# Patient Record
Sex: Female | Born: 1965 | Race: White | Hispanic: No | Marital: Married | Smoking: Never smoker
Health system: Southern US, Community
[De-identification: ages and names within clinical notes are randomized; demographics above are authoritative.]

## PROBLEM LIST (undated history)

## (undated) DIAGNOSIS — F419 Anxiety disorder, unspecified: Secondary | ICD-10-CM

## (undated) HISTORY — DX: Anxiety disorder, unspecified: F41.9

## (undated) HISTORY — PX: NO PAST SURGERIES: SHX2092

---

## 2009-07-11 ENCOUNTER — Ambulatory Visit: Payer: Self-pay | Admitting: Obstetrics and Gynecology

## 2009-07-17 ENCOUNTER — Ambulatory Visit: Payer: Self-pay | Admitting: Obstetrics and Gynecology

## 2010-01-21 ENCOUNTER — Ambulatory Visit: Payer: Self-pay | Admitting: Surgery

## 2015-08-09 ENCOUNTER — Ambulatory Visit (INDEPENDENT_AMBULATORY_CARE_PROVIDER_SITE_OTHER): Payer: 59 | Admitting: Family Medicine

## 2015-08-09 ENCOUNTER — Encounter: Payer: Self-pay | Admitting: Family Medicine

## 2015-08-09 ENCOUNTER — Encounter (INDEPENDENT_AMBULATORY_CARE_PROVIDER_SITE_OTHER): Payer: Self-pay

## 2015-08-09 VITALS — BP 118/82 | HR 76 | Temp 98.2°F | Ht 67.0 in | Wt 212.2 lb

## 2015-08-09 DIAGNOSIS — E669 Obesity, unspecified: Secondary | ICD-10-CM | POA: Diagnosis not present

## 2015-08-09 DIAGNOSIS — F419 Anxiety disorder, unspecified: Secondary | ICD-10-CM

## 2015-08-09 DIAGNOSIS — Z0000 Encounter for general adult medical examination without abnormal findings: Secondary | ICD-10-CM | POA: Insufficient documentation

## 2015-08-09 DIAGNOSIS — F418 Other specified anxiety disorders: Secondary | ICD-10-CM | POA: Insufficient documentation

## 2015-08-09 LAB — COMPREHENSIVE METABOLIC PANEL
ALBUMIN: 3.7 g/dL (ref 3.5–5.2)
ALK PHOS: 62 U/L (ref 39–117)
ALT: 13 U/L (ref 0–35)
AST: 15 U/L (ref 0–37)
BILIRUBIN TOTAL: 0.4 mg/dL (ref 0.2–1.2)
BUN: 8 mg/dL (ref 6–23)
CO2: 28 mEq/L (ref 19–32)
CREATININE: 0.74 mg/dL (ref 0.40–1.20)
Calcium: 8.9 mg/dL (ref 8.4–10.5)
Chloride: 106 mEq/L (ref 96–112)
GFR: 89.19 mL/min (ref >60.00)
GLUCOSE: 86 mg/dL (ref 70–99)
Potassium: 4.2 mEq/L (ref 3.5–5.1)
SODIUM: 139 meq/L (ref 135–145)
TOTAL PROTEIN: 6.2 g/dL (ref 6.0–8.3)

## 2015-08-09 LAB — LIPID PANEL
CHOLESTEROL: 165 mg/dL (ref 0–200)
HDL: 43.2 mg/dL (ref >39.00)
LDL Cholesterol: 103 mg/dL — ABNORMAL HIGH (ref 0–99)
NONHDL: 121.69
Total CHOL/HDL Ratio: 4
Triglycerides: 92 mg/dL (ref 0.0–149.0)
VLDL: 18.4 mg/dL (ref 0.0–40.0)

## 2015-08-09 LAB — TSH: TSH: 1.43 u[IU]/mL (ref 0.35–4.50)

## 2015-08-09 MED ORDER — HYDROXYZINE HCL 50 MG PO TABS: 25.0000 mg | ORAL_TABLET | Freq: Three times a day (TID) | ORAL | Status: DC | PRN

## 2015-08-09 NOTE — Patient Instructions (Addendum)
Good to meet you today. labwork today.  Return as needed or in 1 year for physical.  Health Maintenance, Female Adopting a healthy lifestyle and getting preventive care can go a long way to promote health and wellness. Talk with your health care provider about what schedule of regular examinations is right for you. This is a good chance for you to check in with your provider about disease prevention and staying healthy. In between checkups, there are plenty of things you can do on your own. Experts have done a lot of research about which lifestyle changes and preventive measures are most likely to keep you healthy. Ask your health care provider for more information. WEIGHT AND DIET  Eat a healthy diet  Be sure to include plenty of vegetables, fruits, low-fat dairy products, and lean protein.  Do not eat a lot of foods high in solid fats, added sugars, or salt.  Get regular exercise. This is one of the most important things you can do for your health.  Most adults should exercise for at least 150 minutes each week. The exercise should increase your heart rate and make you sweat (moderate-intensity exercise).  Most adults should also do strengthening exercises at least twice a week. This is in addition to the moderate-intensity exercise.  Maintain a healthy weight  Body mass index (BMI) is a measurement that can be used to identify possible weight problems. It estimates body fat based on height and weight. Your health care provider can help determine your BMI and help you achieve or maintain a healthy weight.  For females 49 years of age and older:   A BMI below 18.5 is considered underweight.  A BMI of 18.5 to 24.9 is normal.  A BMI of 25 to 29.9 is considered overweight.  A BMI of 30 and above is considered obese.  Watch levels of cholesterol and blood lipids  You should start having your blood tested for lipids and cholesterol at 47 years of age, then have this test every 5  years.  You may need to have your cholesterol levels checked more often if:  Your lipid or cholesterol levels are high.  You are older than 47 years of age.  You are at high risk for heart disease.  CANCER SCREENING   Lung Cancer  Lung cancer screening is recommended for adults 97-55 years old who are at high risk for lung cancer because of a history of smoking.  A yearly low-dose CT scan of the lungs is recommended for people who:  Currently smoke.  Have quit within the past 15 years.  Have at least a 30-pack-year history of smoking. A pack year is smoking an average of one pack of cigarettes a day for 1 year.  Yearly screening should continue until it has been 15 years since you quit.  Yearly screening should stop if you develop a health problem that would prevent you from having lung cancer treatment.  Breast Cancer  Practice breast self-awareness. This means understanding how your breasts normally appear and feel.  It also means doing regular breast self-exams. Let your health care provider know about any changes, no matter how small.  If you are in your 20s or 30s, you should have a clinical breast exam (CBE) by a health care provider every 1-3 years as part of a regular health exam.  If you are 45 or older, have a CBE every year. Also consider having a breast X-ray (mammogram) every year.  If you have  a family history of breast cancer, talk to your health care provider about genetic screening.  If you are at high risk for breast cancer, talk to your health care provider about having an MRI and a mammogram every year.  Breast cancer gene (BRCA) assessment is recommended for women who have family members with BRCA-related cancers. BRCA-related cancers include:  Breast.  Ovarian.  Tubal.  Peritoneal cancers.  Results of the assessment will determine the need for genetic counseling and BRCA1 and BRCA2 testing. Cervical Cancer Your health care provider may  recommend that you be screened regularly for cancer of the pelvic organs (ovaries, uterus, and vagina). This screening involves a pelvic examination, including checking for microscopic changes to the surface of your cervix (Pap test). You may be encouraged to have this screening done every 3 years, beginning at age 21.  For women ages 30-65, health care providers may recommend pelvic exams and Pap testing every 3 years, or they may recommend the Pap and pelvic exam, combined with testing for human papilloma virus (HPV), every 5 years. Some types of HPV increase your risk of cervical cancer. Testing for HPV may also be done on women of any age with unclear Pap test results.  Other health care providers may not recommend any screening for nonpregnant women who are considered low risk for pelvic cancer and who do not have symptoms. Ask your health care provider if a screening pelvic exam is right for you.  If you have had past treatment for cervical cancer or a condition that could lead to cancer, you need Pap tests and screening for cancer for at least 20 years after your treatment. If Pap tests have been discontinued, your risk factors (such as having a new sexual partner) need to be reassessed to determine if screening should resume. Some women have medical problems that increase the chance of getting cervical cancer. In these cases, your health care provider may recommend more frequent screening and Pap tests. Colorectal Cancer  This type of cancer can be detected and often prevented.  Routine colorectal cancer screening usually begins at 47 years of age and continues through 47 years of age.  Your health care provider may recommend screening at an earlier age if you have risk factors for colon cancer.  Your health care provider may also recommend using home test kits to check for hidden blood in the stool.  A small camera at the end of a tube can be used to examine your colon directly  (sigmoidoscopy or colonoscopy). This is done to check for the earliest forms of colorectal cancer.  Routine screening usually begins at age 50.  Direct examination of the colon should be repeated every 5-10 years through 47 years of age. However, you may need to be screened more often if early forms of precancerous polyps or small growths are found. Skin Cancer  Check your skin from head to toe regularly.  Tell your health care provider about any new moles or changes in moles, especially if there is a change in a mole's shape or color.  Also tell your health care provider if you have a mole that is larger than the size of a pencil eraser.  Always use sunscreen. Apply sunscreen liberally and repeatedly throughout the day.  Protect yourself by wearing long sleeves, pants, a wide-brimmed hat, and sunglasses whenever you are outside. HEART DISEASE, DIABETES, AND HIGH BLOOD PRESSURE   High blood pressure causes heart disease and increases the risk of stroke. High   blood pressure is more likely to develop in:  People who have blood pressure in the high end of the normal range (130-139/85-89 mm Hg).  People who are overweight or obese.  People who are African American.  If you are 18-39 years of age, have your blood pressure checked every 3-5 years. If you are 40 years of age or older, have your blood pressure checked every year. You should have your blood pressure measured twice--once when you are at a hospital or clinic, and once when you are not at a hospital or clinic. Record the average of the two measurements. To check your blood pressure when you are not at a hospital or clinic, you can use:  An automated blood pressure machine at a pharmacy.  A home blood pressure monitor.  If you are between 55 years and 79 years old, ask your health care provider if you should take aspirin to prevent strokes.  Have regular diabetes screenings. This involves taking a blood sample to check your  fasting blood sugar level.  If you are at a normal weight and have a low risk for diabetes, have this test once every three years after 47 years of age.  If you are overweight and have a high risk for diabetes, consider being tested at a younger age or more often. PREVENTING INFECTION  Hepatitis B  If you have a higher risk for hepatitis B, you should be screened for this virus. You are considered at high risk for hepatitis B if:  You were born in a country where hepatitis B is common. Ask your health care provider which countries are considered high risk.  Your parents were born in a high-risk country, and you have not been immunized against hepatitis B (hepatitis B vaccine).  You have HIV or AIDS.  You use needles to inject street drugs.  You live with someone who has hepatitis B.  You have had sex with someone who has hepatitis B.  You get hemodialysis treatment.  You take certain medicines for conditions, including cancer, organ transplantation, and autoimmune conditions. Hepatitis C  Blood testing is recommended for:  Everyone born from 1945 through 1965.  Anyone with known risk factors for hepatitis C. Sexually transmitted infections (STIs)  You should be screened for sexually transmitted infections (STIs) including gonorrhea and chlamydia if:  You are sexually active and are younger than 47 years of age.  You are older than 47 years of age and your health care provider tells you that you are at risk for this type of infection.  Your sexual activity has changed since you were last screened and you are at an increased risk for chlamydia or gonorrhea. Ask your health care provider if you are at risk.  If you do not have HIV, but are at risk, it may be recommended that you take a prescription medicine daily to prevent HIV infection. This is called pre-exposure prophylaxis (PrEP). You are considered at risk if:  You are sexually active and do not regularly use condoms or  know the HIV status of your partner(s).  You take drugs by injection.  You are sexually active with a partner who has HIV. Talk with your health care provider about whether you are at high risk of being infected with HIV. If you choose to begin PrEP, you should first be tested for HIV. You should then be tested every 3 months for as long as you are taking PrEP.  PREGNANCY   If you are   If you are premenopausal and you may become pregnant, ask your health care provider about preconception counseling.  If you may become pregnant, take 400 to 800 micrograms (mcg) of folic acid every day.  If you want to prevent pregnancy, talk to your health care provider about birth control (contraception). OSTEOPOROSIS AND MENOPAUSE   Osteoporosis is a disease in which the bones lose minerals and strength with aging. This can result in serious bone fractures. Your risk for osteoporosis can be identified using a bone density scan.  If you are 65 years of age or older, or if you are at risk for osteoporosis and fractures, ask your health care provider if you should be screened.  Ask your health care provider whether you should take a calcium or vitamin D supplement to lower your risk for osteoporosis.  Menopause may have certain physical symptoms and risks.  Hormone replacement therapy may reduce some of these symptoms and risks. Talk to your health care provider about whether hormone replacement therapy is right for you.  HOME CARE INSTRUCTIONS   Schedule regular health, dental, and eye exams.  Stay current with your immunizations.   Do not use any tobacco products including cigarettes, chewing tobacco, or electronic cigarettes.  If you are pregnant, do not drink alcohol.  If you are breastfeeding, limit how much and how often you drink alcohol.  Limit alcohol intake to no more than 1 drink per day for nonpregnant women. One drink equals 12 ounces of beer, 5 ounces of wine, or 1 ounces of hard  liquor.  Do not use street drugs.  Do not share needles.  Ask your health care provider for help if you need support or information about quitting drugs.  Tell your health care provider if you often feel depressed.  Tell your health care provider if you have ever been abused or do not feel safe at home.   This information is not intended to replace advice given to you by your health care provider. Make sure you discuss any questions you have with your health care provider.   Document Released: 02/23/2011 Document Revised: 08/31/2014 Document Reviewed: 07/12/2013 Elsevier Interactive Patient Education 2016 [employer] Inc.  

## 2015-08-09 NOTE — Assessment & Plan Note (Signed)
Discussed healthy diet and lifestyle changes to affect sustainable weight loss  

## 2015-08-09 NOTE — Progress Notes (Signed)
BP 118/82 mmHg  Pulse 76  Temp(Src) 98.2 F (36.8 C) (Oral)  Ht 5\' 7"  (1.702 m)  Wt 212 lb 4 oz (96.276 kg)  BMI 33.24 kg/m2  LMP 07/26/2015   CC: new pt to establish  Subjective:    [name], female    DOB: 1966/03/24, 49 y.o.   MRN: [id number]  HPI: Jasmine Willis is a 49 y.o. female presenting on 08/09/2015 for Establish Care   Mild anxiety - requests refill of hydroxyzine 50mg  PRN. Some URI sxs - Cough, congestion, ST, drainage, rhinorrhea. No fever. Slowly improving.   R knee intermittent pain. Treated with meloxicam 15mg  prn.   Preventative: [facility]. Normal pap smears.  Mammogram normal a few years ago  Flu shot yearly Tetanus - ? Seat belt use discussed Sunscreen use discussed. No changing moles on skin.  Lives with husband and 2 children, 1 dog Edu: some college Occupation: works at [employer] Activity: walking 2 miles 3x/wk, lots of walking at work Diet: good water, fruits/vegetables daily  Relevant past medical, surgical, family and social history reviewed and updated as indicated. Interim medical history since our last visit reviewed. Allergies and medications reviewed and updated. No current outpatient prescriptions on file prior to visit.   No current facility-administered medications on file prior to visit.    Review of Systems  Constitutional: Negative for fever, chills, activity change, appetite change, fatigue and unexpected weight change.  HENT: Positive for congestion. Negative for hearing loss.   Eyes: Negative for visual disturbance.  Respiratory: Positive for choking. Negative for cough, chest tightness, shortness of breath and wheezing.   Cardiovascular: Negative for chest pain, palpitations and leg swelling.  Gastrointestinal: Negative for nausea, vomiting, abdominal pain, diarrhea, constipation, blood in stool and abdominal distention.  Genitourinary: Negative for hematuria and difficulty  urinating.  Musculoskeletal: Negative for myalgias, arthralgias and neck pain.  Skin: Negative for rash.  Neurological: Negative for dizziness, seizures, syncope and headaches.  Hematological: Negative for adenopathy. Does not bruise/bleed easily.  Psychiatric/Behavioral: Negative for dysphoric mood. The patient is nervous/anxious.    Per HPI unless specifically indicated in ROS section     Objective:    BP 118/82 mmHg  Pulse 76  Temp(Src) 98.2 F (36.8 C) (Oral)  Ht 5\' 7"  (1.702 m)  Wt 212 lb 4 oz (96.276 kg)  BMI 33.24 kg/m2  LMP 07/26/2015  Wt Readings from Last 3 Encounters:  08/09/15 212 lb 4 oz (96.276 kg)    Physical Exam  Constitutional: She is oriented to person, place, and time. She appears well-developed and well-nourished. No distress.  HENT:  Head: Normocephalic and atraumatic.  Right Ear: Hearing, tympanic membrane, external ear and ear canal normal.  Left Ear: Hearing, tympanic membrane, external ear and ear canal normal.  Nose: Nose normal.  Mouth/Throat: Uvula is midline, oropharynx is clear and moist and mucous membranes are normal. No oropharyngeal exudate, posterior oropharyngeal edema or posterior oropharyngeal erythema.  Eyes: Conjunctivae and EOM are normal. Pupils are equal, round, and reactive to light. No scleral icterus.  Neck: Normal range of motion. Neck supple. No thyromegaly present.  Cardiovascular: Normal rate, regular rhythm, normal heart sounds and intact distal pulses.   No murmur heard. Pulses:      Radial pulses are 2+ on the right side, and 2+ on the left side.  Pulmonary/Chest: Effort normal and breath sounds normal. No respiratory distress. She has no wheezes. She has no rales.  Abdominal:  Soft. Bowel sounds are normal. She exhibits no distension and no mass. There is no tenderness. There is no rebound and no guarding.  Musculoskeletal: Normal range of motion. She exhibits no edema.  Lymphadenopathy:    She has no cervical adenopathy.    Neurological: She is alert and oriented to person, place, and time.  CN grossly intact, station and gait intact  Skin: Skin is warm and dry. No rash noted.  Psychiatric: She has a normal mood and affect. Her behavior is normal. Judgment and thought content normal.  Nursing note and vitals reviewed.  No results found for this or any previous visit.    Assessment & Plan:   Problem List Items Addressed This Visit    Obesity, Class I, BMI 30-34.9    Discussed healthy diet and lifestyle changes to affect sustainable weight loss.      Relevant Orders   Comprehensive metabolic panel   Lipid panel   TSH   Health maintenance examination - Primary    Preventative protocols reviewed and updated unless pt declined. Discussed healthy diet and lifestyle.       Anxiety    Mild, treated with prn  hydroxyzine tablet.      Relevant Medications   hydrOXYzine (ATARAX/VISTARIL) 50 MG tablet       Follow up plan: Return in about 1 year (around 08/08/2016), or as needed, for annual exam, prior fasting for blood work.

## 2015-08-09 NOTE — Progress Notes (Signed)
Pre visit review using our clinic review tool, if applicable. No additional management support is needed unless otherwise documented below in the visit note. 

## 2015-08-09 NOTE — Assessment & Plan Note (Signed)
Preventative protocols reviewed and updated unless pt declined. Discussed healthy diet and lifestyle.  

## 2015-08-09 NOTE — Assessment & Plan Note (Signed)
Mild, treated with prn 50mg  hydroxyzine tablet.

## 2015-08-12 ENCOUNTER — Encounter: Payer: Self-pay | Admitting: *Deleted

## 2016-06-05 ENCOUNTER — Other Ambulatory Visit: Payer: Self-pay | Admitting: Physician Assistant

## 2016-06-05 ENCOUNTER — Ambulatory Visit (INDEPENDENT_AMBULATORY_CARE_PROVIDER_SITE_OTHER)
Admission: RE | Admit: 2016-06-05 | Discharge: 2016-06-05 | Disposition: A | Discharge status: Home / Self Care | Payer: 59 | Source: Ambulatory Visit | Attending: Family Medicine | Admitting: Family Medicine

## 2016-06-05 ENCOUNTER — Encounter: Payer: Self-pay | Admitting: Family Medicine

## 2016-06-05 ENCOUNTER — Encounter (INDEPENDENT_AMBULATORY_CARE_PROVIDER_SITE_OTHER): Payer: Self-pay

## 2016-06-05 ENCOUNTER — Other Ambulatory Visit: Payer: Self-pay | Admitting: Family Medicine

## 2016-06-05 ENCOUNTER — Ambulatory Visit (INDEPENDENT_AMBULATORY_CARE_PROVIDER_SITE_OTHER): Payer: 59 | Admitting: Family Medicine

## 2016-06-05 ENCOUNTER — Ambulatory Visit: Payer: 59 | Admitting: Family Medicine

## 2016-06-05 VITALS — BP 120/80 | HR 66 | Temp 97.9°F | Wt 222.5 lb

## 2016-06-05 DIAGNOSIS — M79671 Pain in right foot: Secondary | ICD-10-CM | POA: Diagnosis not present

## 2016-06-05 DIAGNOSIS — M722 Plantar fascial fibromatosis: Secondary | ICD-10-CM

## 2016-06-05 DIAGNOSIS — M25561 Pain in right knee: Principal | ICD-10-CM

## 2016-06-05 DIAGNOSIS — Z6834 Body mass index (BMI) 34.0-34.9, adult: Secondary | ICD-10-CM

## 2016-06-05 DIAGNOSIS — G8929 Other chronic pain: Secondary | ICD-10-CM

## 2016-06-05 NOTE — Telephone Encounter (Signed)
Patient contacted and informed to follow up before she runs out no refills until then

## 2016-06-05 NOTE — Progress Notes (Signed)
   Subjective:    [name], female    DOB: 07/02/1966, 50 y.o.   MRN: [id number]  HPI This is a 50 yo female who presents today with right knee pain x 3-4 months. She does not recall any injury. Has had intermittent knee pain in the past and was told it was likely arthritis. Has morning stiffness and can not always extend leg fully. Has taken meloxicam and ibuprofen. More relief with ibuprofen. Knee will occasionally give out. Has tried a slip on knee brace, but felt it was too constrictive and limited her movement. Wore corrective shoes as a child. Also has pain on bottom of right foot. Has gotten new shoes which helps. Has had to decrease her walking due to foot pain.   Past Medical History:  Diagnosis Date  . Anxiety    Past Surgical History:  Procedure Laterality Date  . NO PAST SURGERIES     Family History  Problem Relation Age of Onset  . Diabetes Maternal Grandfather   . Cancer Paternal Grandmother     liver?  . CAD Neg Hx   . Stroke Neg Hx    Social History  Substance Use Topics  . Smoking status: Never Smoker  . Smokeless tobacco: Never Used  . Alcohol use 0.0 oz/week      Review of Systems Per HPI    Objective:   Physical Exam  Constitutional: She is oriented to person, place, and time. She appears well-developed and well-nourished. No distress.  HENT:  Head: Normocephalic and atraumatic.  Cardiovascular: Normal rate.   Pulmonary/Chest: Effort normal.  Musculoskeletal:       Right knee: She exhibits swelling (slight medial patellar.). She exhibits normal range of motion, no effusion, no ecchymosis, no deformity, no erythema, normal alignment, no LCL laxity, normal patellar mobility and no bony tenderness.  Feet with high arches.  Neurological: She is alert and oriented to person, place, and time.  Skin: Skin is warm and dry. She is not diaphoretic.  Psychiatric: She has a normal mood and affect. Her behavior is normal. Judgment and thought  content normal.  Vitals reviewed.     BP 120/80   Pulse 66   Temp 97.9 F (36.6 C) (Oral)   Wt 222 lb 8 oz (100.9 kg)   SpO2 99%   BMI 34.85 kg/m  Wt Readings from Last 3 Encounters:  06/05/16 222 lb 8 oz (100.9 kg)  08/09/15 212 lb 4 oz (96.3 kg)       Assessment & Plan:  1. Chronic pain of right knee - discussed otc analgesics and heat/ice - will determine follow up based on xray - DG Knee Complete 4 Views Right; Future  2. Right foot pain - likely plantar fasciitis, see #3  3. Plantar fasciitis of right foot - has gotten some relief with shoe change and otc inserts, she is wearing a very flat shoe today - encouraged supportive shoes all the time while awake, continue stretching exercises - otc analgesics  4. BMI 34.0-34.9,adult - encouraged as much activity as she can tolerate - decrease portions when not able to exercise as much - discussed effect of weight on joints   Olean Reeeborah Margarine Grosshans, FNP-BC  [facility], [facility]  06/05/2016 9:59 AM

## 2016-06-05 NOTE — Patient Instructions (Signed)
Continue to do daily stretching exercises for foot and knee I will call you about your xray and we will determine follow up Can take ibuprofen 200 mg tablets- 2-3 every 8 to 12 hours as needed, can alternate with Tylenol    Knee Pain Knee pain is a very common symptom and can have many causes. Knee pain often goes away when you follow your health care provider's instructions for relieving pain and discomfort at home. However, knee pain can develop into a condition that needs treatment. Some conditions may include:  Arthritis caused by wear and tear (osteoarthritis).  Arthritis caused by swelling and irritation (rheumatoid arthritis or gout).  A cyst or growth in your knee.  An infection in your knee joint.  An injury that will not heal.  Damage, swelling, or irritation of the tissues that support your knee (torn ligaments or tendinitis). If your knee pain continues, additional tests may be ordered to diagnose your condition. Tests may include X-rays or other imaging studies of your knee. You may also need to have fluid removed from your knee. Treatment for ongoing knee pain depends on the cause, but treatment may include:  Medicines to relieve pain or swelling.  Steroid injections in your knee.  Physical therapy.  Surgery. HOME CARE INSTRUCTIONS  Take medicines only as directed by your health care provider.  Rest your knee and keep it raised (elevated) while you are resting.  Do not do things that cause or worsen pain.  Avoid high-impact activities or exercises, such as running, jumping rope, or doing jumping jacks.  Apply ice to the knee area:  Put ice in a plastic bag.  Place a towel between your skin and the bag.  Leave the ice on for 20 minutes, 2-3 times a day.  Ask your health care provider if you should wear an elastic knee support.  Keep a pillow under your knee when you sleep.  Lose weight if you are overweight. Extra weight can put pressure on your  knee.  Do not use any tobacco products, including cigarettes, chewing tobacco, or electronic cigarettes. If you need help quitting, ask your health care provider. Smoking may slow the healing of any bone and joint problems that you may have. SEEK MEDICAL CARE IF:  Your knee pain continues, changes, or gets worse.  You have a fever along with knee pain.  Your knee buckles or locks up.  Your knee becomes more swollen. SEEK IMMEDIATE MEDICAL CARE IF:   Your knee joint feels hot to the touch.  You have chest pain or trouble breathing.   This information is not intended to replace advice given to you by your health care provider. Make sure you discuss any questions you have with your health care provider.   Document Released: 06/07/2007 Document Revised: 08/31/2014 Document Reviewed: 03/26/2014 Elsevier Interactive Patient Education 2016 Else[employer] Inc[employer]    Plantar Fasciitis Plantar fasciitis is a painful foot condition that affects the heel. It occurs when the band of tissue that connects the toes to the heel bone (plantar fascia) becomes irritated. This can happen after exercising too much or doing other repetitive activities (overuse injury). The pain from plantar fasciitis can range from mild irritation to severe pain that makes it difficult for you to walk or move. The pain is usually worse in the morning or after you have been sitting or lying down for a while. CAUSES This condition may be caused by:  Standing for long periods of time.  Wearing shoes  that do not fit.  Doing high-impact activities, including running, aerobics, and ballet.  Being overweight.  Having an abnormal way of walking (gait).  Having tight calf muscles.  Having high arches in your feet.  Starting a new athletic activity. SYMPTOMS The main symptom of this condition is heel pain. Other symptoms include:  Pain that gets worse after activity or exercise.  Pain that is worse in the morning or after  resting.  Pain that goes away after you walk for a few minutes. DIAGNOSIS This condition may be diagnosed based on your signs and symptoms. Your health care provider will also do a physical exam to check for:  A tender area on the bottom of your foot.  A high arch in your foot.  Pain when you move your foot.  Difficulty moving your foot. You may also need to have imaging studies to confirm the diagnosis. These can include:  X-rays.  Ultrasound.  MRI. TREATMENT  Treatment for plantar fasciitis depends on the severity of the condition. Your treatment may include:  Rest, ice, and over-the-counter pain medicines to manage your pain.  Exercises to stretch your calves and your plantar fascia.  A splint that holds your foot in a stretched, upward position while you sleep (night splint).  Physical therapy to relieve symptoms and prevent problems in the future.  Cortisone injections to relieve severe pain.  Extracorporeal shock wave therapy (ESWT) to stimulate damaged plantar fascia with electrical impulses. It is often used as a last resort before surgery.  Surgery, if other treatments have not worked after 12 months. HOME CARE INSTRUCTIONS  Take medicines only as directed by your health care provider.  Avoid activities that cause pain.  Roll the bottom of your foot over a bag of ice or a bottle of cold water. Do this for 20 minutes, 3-4 times a day.  Perform simple stretches as directed by your health care provider.  Try wearing athletic shoes with air-sole or gel-sole cushions or soft shoe inserts.  Wear a night splint while sleeping, if directed by your health care provider.  Keep all follow-up appointments with your health care provider. PREVENTION   Do not perform exercises or activities that cause heel pain.  Consider finding low-impact activities if you continue to have problems.  Lose weight if you need to. The best way to prevent plantar fasciitis is to  avoid the activities that aggravate your plantar fascia. SEEK MEDICAL CARE IF:  Your symptoms do not go away after treatment with home care measures.  Your pain gets worse.  Your pain affects your ability to move or do your daily activities.   This information is not intended to replace advice given to you by your health care provider. Make sure you discuss any questions you have with your health care provider.   Document Released: 05/05/2001 Document Revised: 05/01/2015 Document Reviewed: 06/20/2014 Elsevier Interactive Patient Education [employer]

## 2016-06-05 NOTE — Telephone Encounter (Signed)
Med refill approved x 1; pt will need to be seen for additional refills

## 2016-06-07 ENCOUNTER — Other Ambulatory Visit: Payer: Self-pay | Admitting: Family Medicine

## 2016-06-07 DIAGNOSIS — M1711 Unilateral primary osteoarthritis, right knee: Secondary | ICD-10-CM

## 2016-06-07 MED ORDER — MELOXICAM 15 MG PO TABS
15.0000 mg | ORAL_TABLET | Freq: Every day | ORAL | 0 refills | Status: DC | PRN
Start: 2016-06-07 — End: 2018-06-22

## 2016-07-31 ENCOUNTER — Ambulatory Visit (INDEPENDENT_AMBULATORY_CARE_PROVIDER_SITE_OTHER): Payer: 59 | Admitting: Primary Care

## 2016-07-31 ENCOUNTER — Encounter: Payer: Self-pay | Admitting: Primary Care

## 2016-07-31 VITALS — BP 124/84 | HR 81 | Temp 98.1°F | Ht 67.0 in | Wt 224.8 lb

## 2016-07-31 DIAGNOSIS — J029 Acute pharyngitis, unspecified: Secondary | ICD-10-CM | POA: Diagnosis not present

## 2016-07-31 DIAGNOSIS — J028 Acute pharyngitis due to other specified organisms: Principal | ICD-10-CM

## 2016-07-31 DIAGNOSIS — B9789 Other viral agents as the cause of diseases classified elsewhere: Secondary | ICD-10-CM

## 2016-07-31 LAB — POCT RAPID STREP A (OFFICE): RAPID STREP A SCREEN: NEGATIVE

## 2016-07-31 MED ORDER — METHYLPREDNISOLONE ACETATE 80 MG/ML IJ SUSP
80.0000 mg | Freq: Once | INTRAMUSCULAR | Status: AC
  Administered 2016-07-31: 80 mg via INTRAMUSCULAR

## 2016-07-31 MED ORDER — LIDOCAINE VISCOUS 2 % MT SOLN: OROMUCOSAL | 0 refills | Status: DC

## 2016-07-31 NOTE — Progress Notes (Signed)
Pre visit review using our clinic review tool, if applicable. No additional management support is needed unless otherwise documented below in the visit note. 

## 2016-07-31 NOTE — Addendum Note (Signed)
Addended by: Tawnya CrookSAMBATH, Kelcy Baeten on: 07/31/2016 12:33 PM   Modules accepted: Orders

## 2016-07-31 NOTE — Patient Instructions (Signed)
We will notify you once we receive your throat culture result.  You may gargle and swallow 10 ml of the viscous lidocaine four times daily as needed for sore throat.  Continue ibuprofen as needed for pain and inflammation. Try warm salt gargles, throat lozenges, chloraseptic spray.  Cough/Congestion: Try taking Mucinex DM or Delsym DM. This will help loosen up the mucous in your chest. Ensure you take this medication with a full glass of water.  Nasal Congestion/Ear Pressure: Try using Flonase (fluticasone) nasal spray. Instill 1 spray in each nostril twice daily.   Ensure you are staying hydrated with water and rest.  Please notify me if you develop persistent fevers of 101, start coughing up green mucous, notice increased fatigue or weakness, and/or feel worse after 1 week of onset of symptoms.   It was a pleasure to see you today!

## 2016-07-31 NOTE — Progress Notes (Signed)
Subjective:    [name], female    DOB: [birth year], 50 y.o.   MRN: [id number]  HPI  Ms. Jasmine Willis is a 50 year old female who presents today with a chief complaint of sore throat. She also reports fatigue, left ear pain, cough, swelling to her throat, pain with swallowing. Her symptoms have been present for the past 2-3 days. She's taken ibuprofen without much improvement. She denies fevers. She works in the hospital. She swabbed herself this morning for strep which was negative.  Review of Systems  Constitutional: Positive for chills and fatigue. Negative for fever.  HENT: Positive for congestion, ear pain and sore throat. Negative for sinus pressure.   Respiratory: Positive for cough. Negative for shortness of breath.   Cardiovascular: Negative for chest pain.       Past Medical History:  Diagnosis Date  . Anxiety      Social History   Social History  . Marital status: Married    Spouse name: N/A  . Number of children: N/A  . Years of education: N/A   Occupational History  . Not on file.   Social History Main Topics  . Smoking status: Never Smoker  . Smokeless tobacco: Never Used  . Alcohol use 0.0 oz/week  . Drug use: No  . Sexual activity: Not on file   Other Topics Concern  . Not on file   Social History Narrative   Lives with husband and 2 children, 1 dog   Edu: some college   Occupation: works at [employer]   Activity: walking 2 miles 3x/wk, lots of walking at work   Diet: good water, fruits/vegetables daily    Past Surgical History:  Procedure Laterality Date  . NO PAST SURGERIES      Family History  Problem Relation Age of Onset  . Diabetes Maternal Grandfather   . Cancer Paternal Grandmother     liver?  . CAD Neg Hx   . Stroke Neg Hx     No Known Allergies  Current Outpatient Prescriptions on File Prior to Visit  Medication Sig Dispense Refill  . hydrOXYzine (ATARAX/VISTARIL) 50 MG tablet Take 0.5-1 tablets (25-50  mg total) by mouth 3 (three) times daily as needed for anxiety. (Patient not taking: Reported on 07/31/2016) 30 tablet 1  . meloxicam (MOBIC) 15 MG tablet Take 1 tablet (15 mg total) by mouth daily as needed for pain. (Patient not taking: Reported on 07/31/2016) 30 tablet 0   No current facility-administered medications on file prior to visit.     BP 124/84   Pulse 81   Temp 98.1 F (36.7 C) (Oral)   Ht 5\' 7"  (1.702 m)   Wt 224 lb 12.8 oz (102 kg)   LMP 06/24/2016 (Within Days)   SpO2 99%   BMI 35.21 kg/m    Objective:   Physical Exam  Constitutional: She appears well-nourished.  HENT:  Right Ear: Tympanic membrane and ear canal normal.  Left Ear: Tympanic membrane and ear canal normal.  Nose: Right sinus exhibits no maxillary sinus tenderness and no frontal sinus tenderness. Left sinus exhibits no maxillary sinus tenderness and no frontal sinus tenderness.  Mouth/Throat: Posterior oropharyngeal edema and posterior oropharyngeal erythema present. No oropharyngeal exudate.  Eyes: Conjunctivae are normal.  Neck: Neck supple.  Cardiovascular: Normal rate and regular rhythm.   Pulmonary/Chest: Effort normal and breath sounds normal. She has no wheezes. She has no rales.  Lymphadenopathy:    She has no cervical  adenopathy.  Skin: Skin is warm and dry.          Assessment & Plan:  Sore Throat:  Also now with mild cough, left ear pain, inflammation to throat x 2-3 days. Exam today with clear lungs, mild-moderate erythema and edema to posterior pharynx. No exudate. Vitals stable. Rapid Strep: Negative Culture sent. Rx for viscous lidocaine sent for discomfort. Depo Medrol 80 mg provided to reduce inflammation. Fluids, rest, return precautions provided.

## 2016-08-02 LAB — CULTURE, GROUP A STREP: ORGANISM ID, BACTERIA: NORMAL

## 2016-08-03 ENCOUNTER — Telehealth: Payer: Self-pay

## 2016-08-03 NOTE — Telephone Encounter (Signed)
Pt was seen 07/31/16 and pt still has congestion after taking mucinex and wants to know if [facility]; pts husband is a kidney transplant pt and pts father has parkinson's disease and she does not want to get them sick. Please advise.

## 2016-08-03 NOTE — Telephone Encounter (Signed)
Please notify patient that her symptoms are representative of a viral infection that will clear on its own. Antibiotics do not treat viral infections, only bacterial infections. We had confirmation that her symptoms of sore throat were not related to a bacterial infection based off of culture results. She can try an antihistamine such as Zyrtec to help dry up her mucous. Ensure she is staying hydrated with water to help thin the mucous.

## 2016-08-04 ENCOUNTER — Encounter: Payer: Self-pay | Admitting: Physician Assistant

## 2016-08-04 ENCOUNTER — Ambulatory Visit: Payer: Self-pay | Admitting: Physician Assistant

## 2016-08-04 VITALS — BP 138/84 | HR 76 | Temp 98.3°F

## 2016-08-04 DIAGNOSIS — J209 Acute bronchitis, unspecified: Secondary | ICD-10-CM

## 2016-08-04 MED ORDER — METHYLPREDNISOLONE 4 MG PO TBPK: ORAL_TABLET | ORAL | 0 refills | Status: DC

## 2016-08-04 MED ORDER — AZITHROMYCIN 250 MG PO TABS: ORAL_TABLET | ORAL | 0 refills | Status: DC

## 2016-08-04 MED ORDER — ALBUTEROL SULFATE HFA 108 (90 BASE) MCG/ACT IN AERS: 2.0000 | INHALATION_SPRAY | Freq: Four times a day (QID) | RESPIRATORY_TRACT | 0 refills | Status: DC | PRN

## 2016-08-04 NOTE — Progress Notes (Signed)
S: C/o cough and congestion with wheezing and chest pain, chest is sore from coughing, denies fever, chills, mucus is mostly clear when she's able to get it out; can hear her chest rattling;  keeping pt awake at night;  denies cardiac type chest pain or sob, v/d, abd pain Remainder ros neg  O: vitals wnl, nad, tms clear, throat injected, neck supple no lymph, lungs c t a, cv rrr, neuro intact, cough is congested  A:  Acute bronchitis   P:  rx medication: zpack, medrol dose pack, albuterol inhaler;  use otc meds, tylenol or motrin as needed for fever/chills, return if not better in 3 -5 days, return earlier if worsening

## 2016-08-04 NOTE — Telephone Encounter (Signed)
Spoke to pt

## 2016-08-06 ENCOUNTER — Telehealth: Payer: Self-pay | Admitting: Physician Assistant

## 2016-08-06 NOTE — Progress Notes (Signed)
Patient wanted cough medication. Per Darl PikesSusan ok'd Tessalon perls 200mg  1-2 tab po tid #30. Spoke with Marisue IvanLiz in Pharmacy

## 2016-08-06 NOTE — Telephone Encounter (Signed)
Spoke with patient informed her that Sondra Comeessalon Perls can be e-scribed over per patient that will be fine.

## 2016-08-06 NOTE — Telephone Encounter (Signed)
I can send in a rx for tessalon perls, the inhaler should help with the cough also

## 2016-10-30 DIAGNOSIS — D8989 Other specified disorders involving the immune mechanism, not elsewhere classified: Secondary | ICD-10-CM | POA: Diagnosis not present

## 2016-10-30 DIAGNOSIS — Z7901 Long term (current) use of anticoagulants: Secondary | ICD-10-CM | POA: Diagnosis not present

## 2016-10-30 DIAGNOSIS — I4891 Unspecified atrial fibrillation: Secondary | ICD-10-CM | POA: Diagnosis not present

## 2016-10-30 DIAGNOSIS — G4733 Obstructive sleep apnea (adult) (pediatric): Secondary | ICD-10-CM | POA: Diagnosis not present

## 2016-10-30 DIAGNOSIS — Z794 Long term (current) use of insulin: Secondary | ICD-10-CM | POA: Diagnosis not present

## 2016-10-30 DIAGNOSIS — E119 Type 2 diabetes mellitus without complications: Secondary | ICD-10-CM | POA: Diagnosis not present

## 2016-10-30 DIAGNOSIS — I10 Essential (primary) hypertension: Secondary | ICD-10-CM | POA: Diagnosis not present

## 2016-10-30 DIAGNOSIS — R808 Other proteinuria: Secondary | ICD-10-CM | POA: Diagnosis not present

## 2016-10-30 DIAGNOSIS — Z940 Kidney transplant status: Secondary | ICD-10-CM | POA: Diagnosis not present

## 2016-12-08 ENCOUNTER — Ambulatory Visit: Payer: Self-pay | Admitting: Physician Assistant

## 2016-12-08 ENCOUNTER — Encounter: Payer: Self-pay | Admitting: Physician Assistant

## 2016-12-08 VITALS — BP 100/60 | HR 86 | Temp 97.5°F

## 2016-12-08 DIAGNOSIS — M545 Low back pain, unspecified: Secondary | ICD-10-CM

## 2016-12-08 MED ORDER — METHYLPREDNISOLONE 4 MG PO TBPK: ORAL_TABLET | ORAL | 0 refills | Status: DC

## 2016-12-08 MED ORDER — BACLOFEN 10 MG PO TABS: 10.0000 mg | ORAL_TABLET | Freq: Three times a day (TID) | ORAL | 0 refills | Status: DC

## 2016-12-08 NOTE — Progress Notes (Signed)
100/60

## 2016-12-08 NOTE — Progress Notes (Signed)
S:  C/o low back pain for 1 day, ? known injury, twisted while sitting in her chair and pain started, pain is worse with movement, increased with bending over, denies numbness, tingling, or changes in bowel/urinary habits,  Using otc meds without relief Remainder ros neg  O:  Vitals wnl, nad, lungs c t a, cv rrr, spine nontender, muscles in lower back spasmed , decreased rom with,  Neg slr, pt walks without difficulty, no foot drop noted, n/v intact  A: acute back pain, muscle spasms  P: use wet heat followed by ice, stretches, return to clinic if not better in 3 t 5 days, return earlier if worsening, rx meds: medrol dose pack, baclofen

## 2016-12-08 NOTE — Addendum Note (Signed)
Addended by: Faythe Ghee on: 12/08/2016 11:04 AM   Modules accepted: Orders

## 2016-12-19 IMAGING — DX DG KNEE AP/LAT W/ SUNRISE*R*
4 series · 4 of 4 positions shown · non-contrast
Comparison: None.

CLINICAL DATA: Right knee pain, chronic

EXAM:
RIGHT KNEE 3 VIEWS

[knee ap]
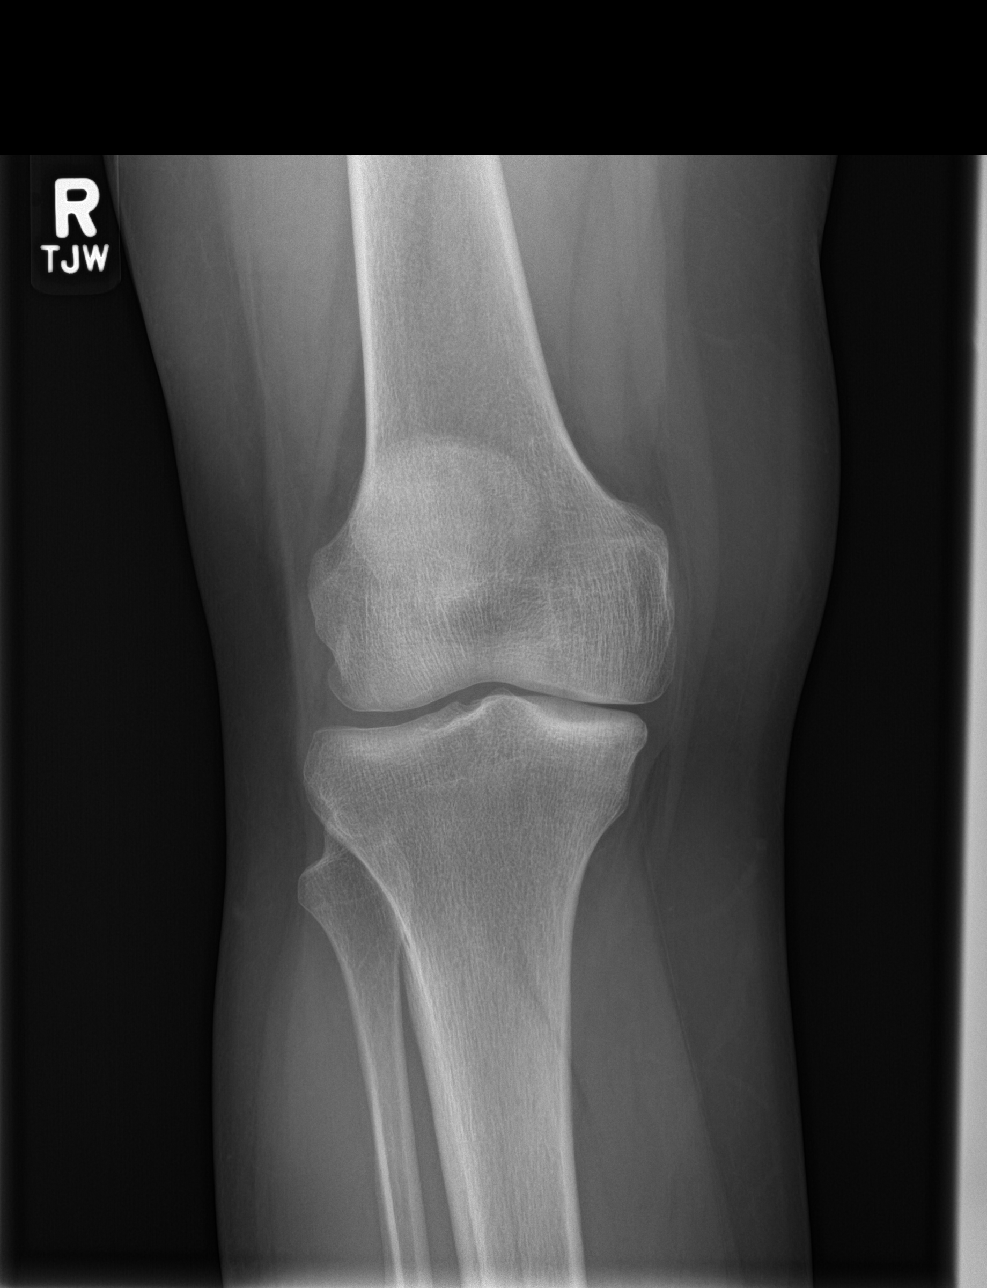

[knee lat]
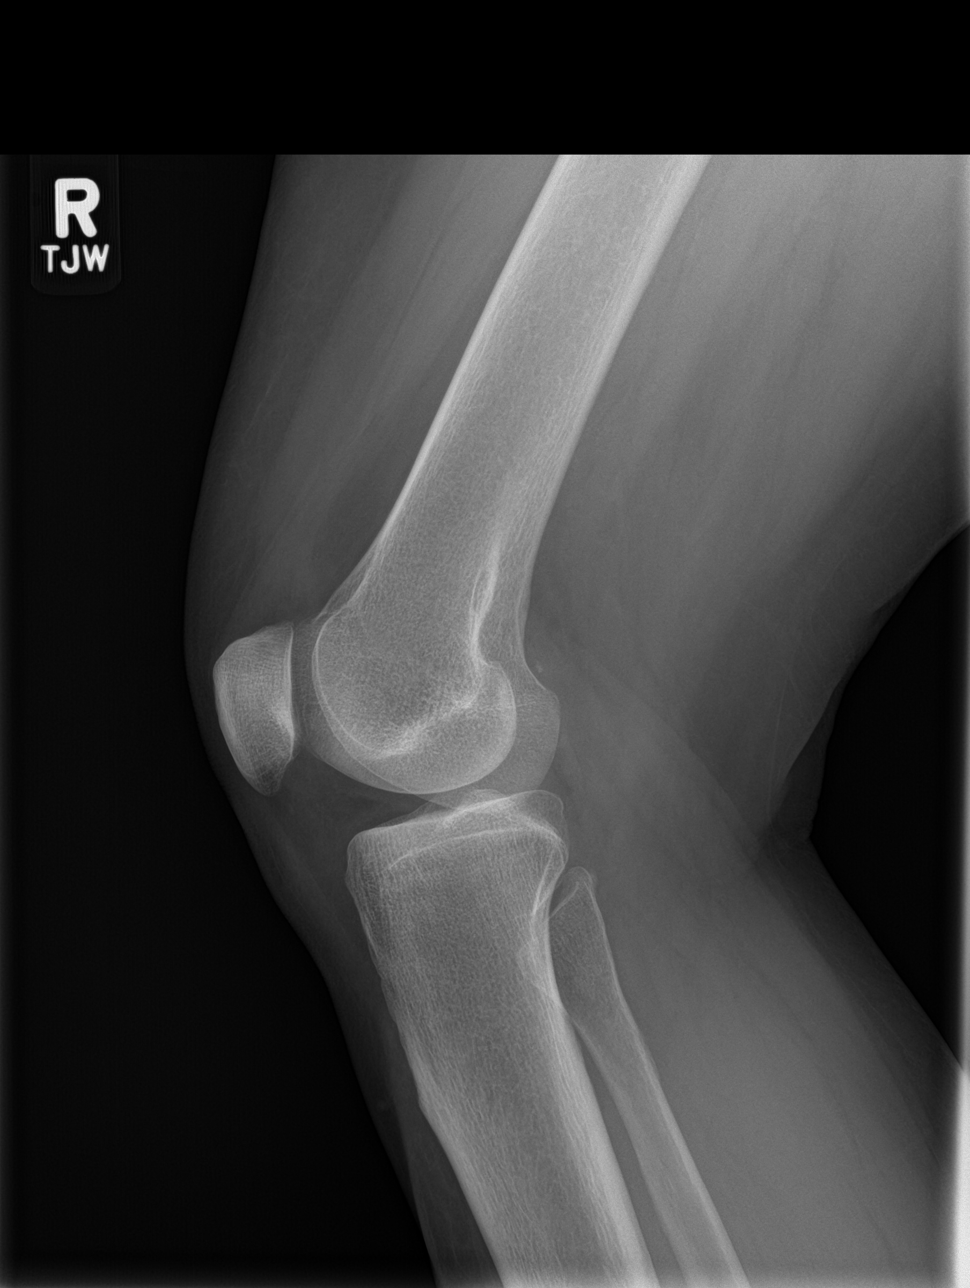

[patella skyline]
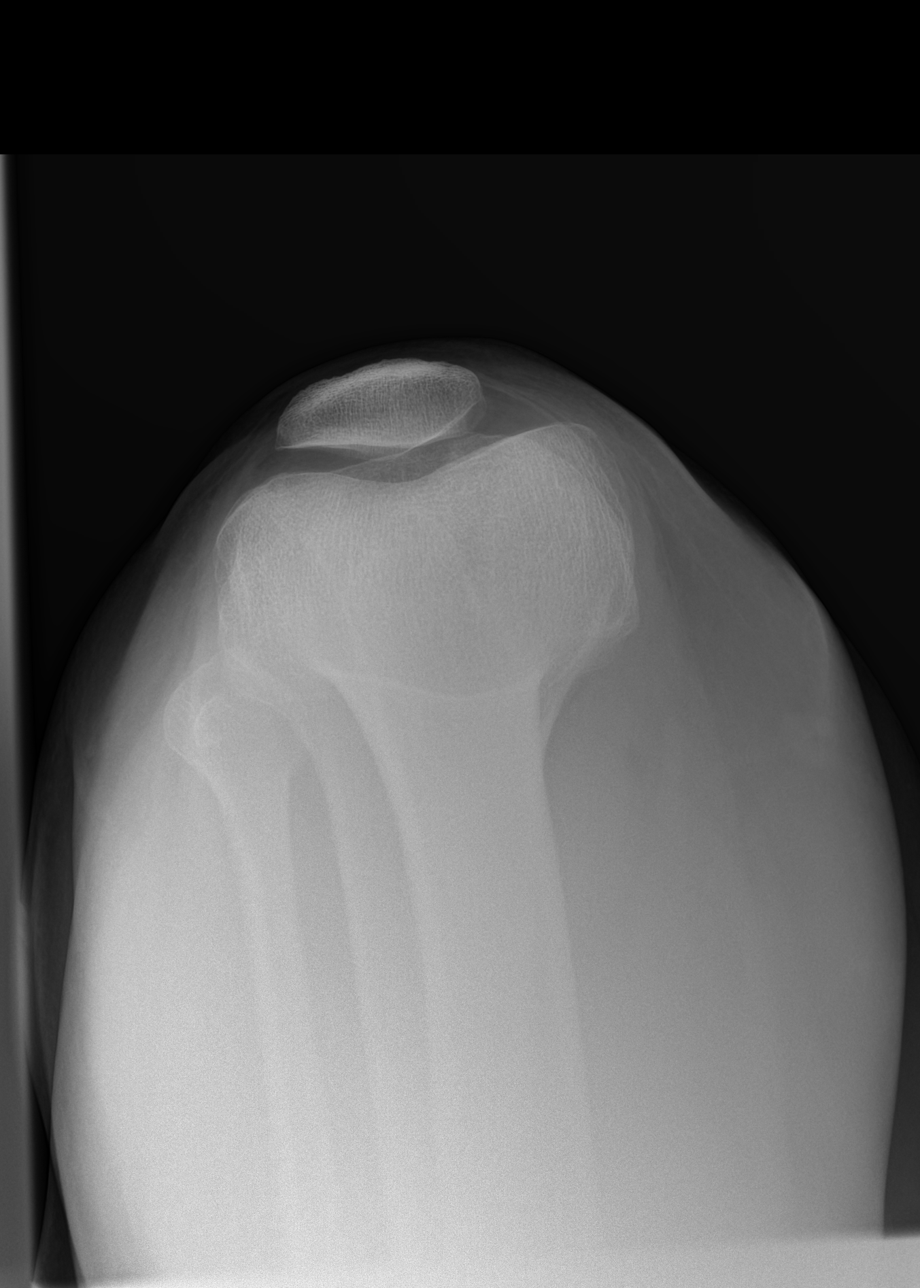

[knee obl]
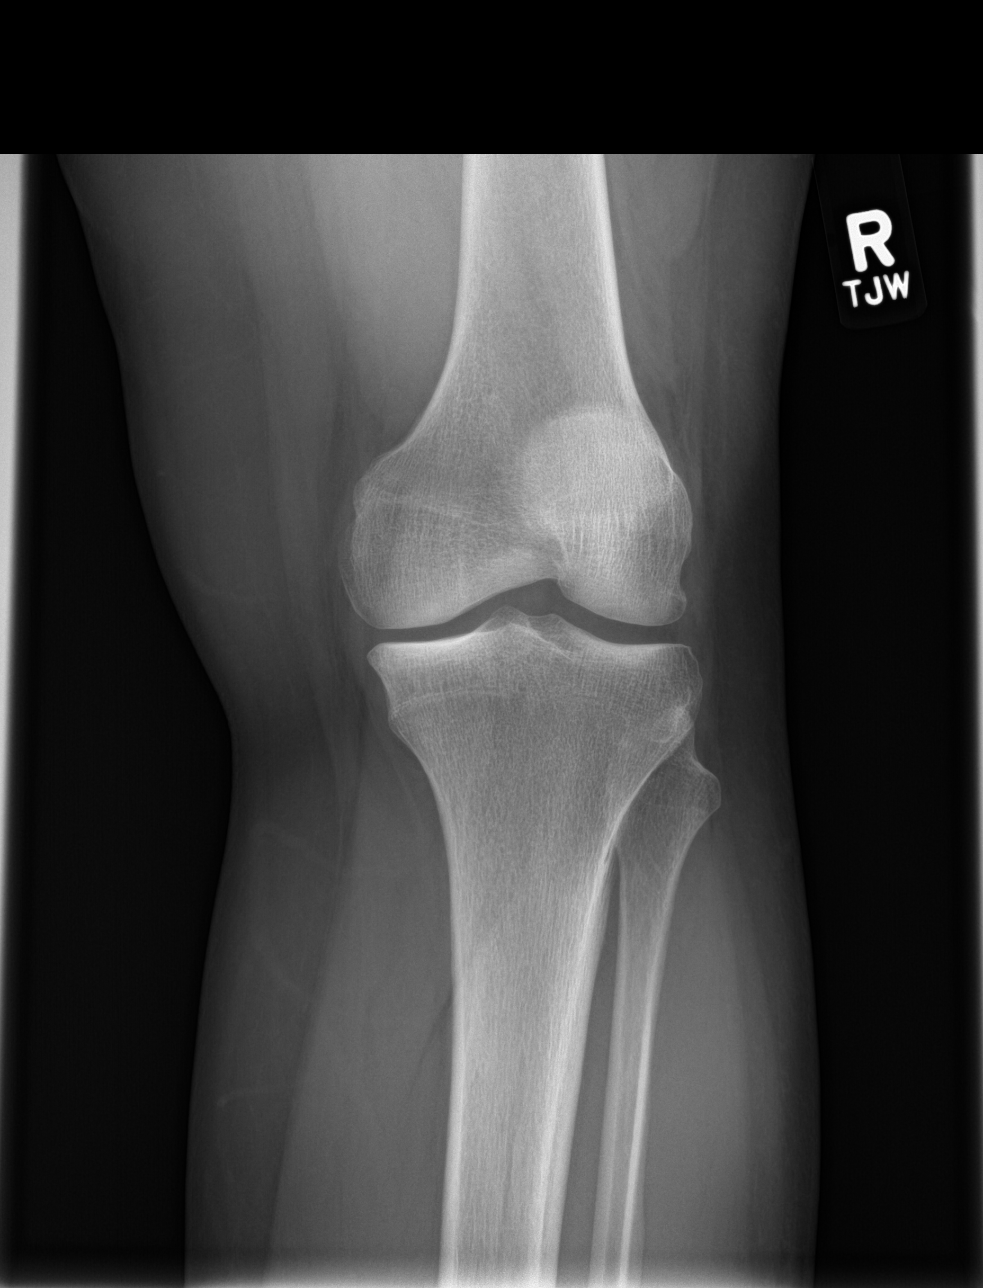

[4 of 4 positions shown; findings below may reference images not displayed]

FINDINGS: Mild joint space narrowing in the medial compartment. Mild
subchondral sclerosis in the medial tibial plateau. No acute bony
abnormality. Specifically, no fracture, subluxation, or dislocation.
Soft tissues are intact. No joint effusion.
IMPRESSION: No acute bony abnormality. Early joint space narrowing in the medial
compartment.

## 2017-01-01 DIAGNOSIS — R600 Localized edema: Secondary | ICD-10-CM | POA: Diagnosis not present

## 2017-01-01 DIAGNOSIS — Z794 Long term (current) use of insulin: Secondary | ICD-10-CM | POA: Diagnosis not present

## 2017-01-01 DIAGNOSIS — I4891 Unspecified atrial fibrillation: Secondary | ICD-10-CM | POA: Diagnosis not present

## 2017-01-01 DIAGNOSIS — I10 Essential (primary) hypertension: Secondary | ICD-10-CM | POA: Diagnosis not present

## 2017-01-01 DIAGNOSIS — D8989 Other specified disorders involving the immune mechanism, not elsewhere classified: Secondary | ICD-10-CM | POA: Diagnosis not present

## 2017-01-01 DIAGNOSIS — Z940 Kidney transplant status: Secondary | ICD-10-CM | POA: Diagnosis not present

## 2017-01-01 DIAGNOSIS — E876 Hypokalemia: Secondary | ICD-10-CM | POA: Diagnosis not present

## 2017-01-01 DIAGNOSIS — E119 Type 2 diabetes mellitus without complications: Secondary | ICD-10-CM | POA: Diagnosis not present

## 2017-01-01 DIAGNOSIS — Z7901 Long term (current) use of anticoagulants: Secondary | ICD-10-CM | POA: Diagnosis not present

## 2017-03-10 DIAGNOSIS — I2510 Atherosclerotic heart disease of native coronary artery without angina pectoris: Secondary | ICD-10-CM | POA: Diagnosis not present

## 2017-03-10 DIAGNOSIS — Z79899 Other long term (current) drug therapy: Secondary | ICD-10-CM | POA: Diagnosis not present

## 2017-03-10 DIAGNOSIS — R809 Proteinuria, unspecified: Secondary | ICD-10-CM | POA: Diagnosis not present

## 2017-03-10 DIAGNOSIS — I10 Essential (primary) hypertension: Secondary | ICD-10-CM | POA: Diagnosis not present

## 2017-03-10 DIAGNOSIS — Z01818 Encounter for other preprocedural examination: Secondary | ICD-10-CM | POA: Diagnosis not present

## 2017-03-10 DIAGNOSIS — D899 Disorder involving the immune mechanism, unspecified: Secondary | ICD-10-CM | POA: Diagnosis not present

## 2017-03-10 DIAGNOSIS — Z940 Kidney transplant status: Secondary | ICD-10-CM | POA: Diagnosis not present

## 2017-03-10 DIAGNOSIS — E119 Type 2 diabetes mellitus without complications: Secondary | ICD-10-CM | POA: Diagnosis not present

## 2017-04-30 DIAGNOSIS — Z5181 Encounter for therapeutic drug level monitoring: Secondary | ICD-10-CM | POA: Diagnosis not present

## 2017-04-30 DIAGNOSIS — I10 Essential (primary) hypertension: Secondary | ICD-10-CM | POA: Diagnosis not present

## 2017-04-30 DIAGNOSIS — Z79899 Other long term (current) drug therapy: Secondary | ICD-10-CM | POA: Diagnosis not present

## 2017-04-30 DIAGNOSIS — Z940 Kidney transplant status: Secondary | ICD-10-CM | POA: Diagnosis not present

## 2017-04-30 DIAGNOSIS — Z4822 Encounter for aftercare following kidney transplant: Secondary | ICD-10-CM | POA: Diagnosis not present

## 2017-04-30 DIAGNOSIS — E119 Type 2 diabetes mellitus without complications: Secondary | ICD-10-CM | POA: Diagnosis not present

## 2017-04-30 DIAGNOSIS — Z9884 Bariatric surgery status: Secondary | ICD-10-CM | POA: Diagnosis not present

## 2017-07-07 ENCOUNTER — Ambulatory Visit: Payer: Self-pay | Admitting: Physician Assistant

## 2017-07-07 ENCOUNTER — Encounter: Payer: Self-pay | Admitting: Physician Assistant

## 2017-07-07 VITALS — BP 130/80 | HR 76 | Temp 98.1°F

## 2017-07-07 DIAGNOSIS — K0889 Other specified disorders of teeth and supporting structures: Secondary | ICD-10-CM

## 2017-07-07 MED ORDER — AMOXICILLIN 875 MG PO TABS
875.0000 mg | ORAL_TABLET | Freq: Two times a day (BID) | ORAL | 0 refills | Status: DC
Start: 2017-07-07 — End: 2018-06-22

## 2017-07-07 NOTE — Progress Notes (Signed)
S: She complains of tooth pain for 3-4 days, states she has a dentist appointment on 07/14/2017, states she has chronic problem with her teeth. Denies fever, chills, chest pain, or shortness of breath  O: Vitals are normal, TMs are clear, throat is normal, patient has poor dentition, neck supple, no lymphadenopathy noted, lungs clear to auscultation, heart sounds are normal  A: Tooth pain  P: Amoxicillin 875 mg twice a day for 10 days

## 2017-07-12 ENCOUNTER — Telehealth: Payer: Self-pay | Admitting: Physician Assistant

## 2017-07-12 MED ORDER — FLUCONAZOLE 150 MG PO TABS: ORAL_TABLET | ORAL | 0 refills | Status: DC

## 2017-07-12 NOTE — Telephone Encounter (Signed)
Patient is having vaginal itching after taking amoxicillin, sent a prescription for Diflucan to [facility] pharmacy

## 2017-07-22 DIAGNOSIS — N939 Abnormal uterine and vaginal bleeding, unspecified: Secondary | ICD-10-CM | POA: Diagnosis not present

## 2017-07-22 DIAGNOSIS — N841 Polyp of cervix uteri: Secondary | ICD-10-CM | POA: Diagnosis not present

## 2017-07-22 DIAGNOSIS — N83299 Other ovarian cyst, unspecified side: Secondary | ICD-10-CM | POA: Diagnosis not present

## 2017-08-25 ENCOUNTER — Telehealth: Payer: 59 | Admitting: Family

## 2017-08-25 DIAGNOSIS — J208 Acute bronchitis due to other specified organisms: Secondary | ICD-10-CM

## 2017-08-25 MED ORDER — BENZONATATE 100 MG PO CAPS: 100.0000 mg | ORAL_CAPSULE | Freq: Three times a day (TID) | ORAL | 0 refills | Status: DC | PRN

## 2017-08-25 NOTE — Progress Notes (Signed)

## 2017-08-30 ENCOUNTER — Ambulatory Visit (INDEPENDENT_AMBULATORY_CARE_PROVIDER_SITE_OTHER): Payer: Self-pay | Admitting: Family

## 2017-08-30 ENCOUNTER — Encounter: Payer: Self-pay | Admitting: Family

## 2017-08-30 VITALS — BP 127/87 | HR 93 | Temp 98.2°F

## 2017-08-30 DIAGNOSIS — J208 Acute bronchitis due to other specified organisms: Secondary | ICD-10-CM

## 2017-08-30 DIAGNOSIS — R05 Cough: Secondary | ICD-10-CM

## 2017-08-30 DIAGNOSIS — R059 Cough, unspecified: Secondary | ICD-10-CM

## 2017-08-30 MED ORDER — AZITHROMYCIN 250 MG PO TABS
ORAL_TABLET | ORAL | 0 refills | Status: DC
Start: 2017-08-30 — End: 2018-06-22

## 2017-08-30 MED ORDER — PREDNISONE 20 MG PO TABS: 40.0000 mg | ORAL_TABLET | Freq: Every day | ORAL | 0 refills | Status: DC

## 2017-08-30 NOTE — Progress Notes (Signed)
Subjective:     [name], female   DOB: 1966/07/25, 52 y.o.   MRN: [id number]  HPI 52 year old female is in today with c/o chest congestion, cough that  Is worsening. Was sen by e-visit 1 week ago and given Tessalon Perles that did not help. Her son was treated for a bacterial infection 3 days ago. Husband sick in the ED now. All similar symptoms  Review of Systems  Constitutional: Negative.   HENT: Positive for congestion and postnasal drip. Negative for sinus pressure and sinus pain.   Respiratory: Positive for cough and chest tightness. Negative for shortness of breath and wheezing.   Gastrointestinal: Negative.   Musculoskeletal: Negative.   Skin: Negative.   Allergic/Immunologic: Negative.   Neurological: Negative.   Psychiatric/Behavioral: Negative.    Past Medical History:  Diagnosis Date  . Anxiety     Social History   Socioeconomic History  . Marital status: Married    Spouse name: Not on file  . Number of children: Not on file  . Years of education: Not on file  . Highest education level: Not on file  Social Needs  . Financial resource strain: Not on file  . Food insecurity - worry: Not on file  . Food insecurity - inability: Not on file  . Transportation needs - medical: Not on file  . Transportation needs - non-medical: Not on file  Occupational History  . Not on file  Tobacco Use  . Smoking status: Never Smoker  . Smokeless tobacco: Never Used  Substance and Sexual Activity  . Alcohol use: Yes    Alcohol/week: 0.0 oz  . Drug use: No  . Sexual activity: Not on file  Other Topics Concern  . Not on file  Social History Narrative   Lives with husband and 2 children, 1 dog   Edu: some college   Occupation: works at [employer]: walking 2 miles 3x/wk, lots of walking at work   Diet: good water, fruits/vegetables daily    Past Surgical History:  Procedure Laterality Date  . NO PAST SURGERIES      Family History   Problem Relation Age of Onset  . Diabetes Maternal Grandfather   . Cancer Paternal Grandmother        liver?  . CAD Neg Hx   . Stroke Neg Hx     No Known Allergies  Current Outpatient Medications on File Prior to Visit  Medication Sig Dispense Refill  . amoxicillin (AMOXIL) 875 MG tablet Take 1 tablet (875 mg total) 2 (two) times daily by mouth. (Patient not taking: Reported on 08/30/2017) 20 tablet 0  . baclofen (LIORESAL) 10 MG tablet Take 1 tablet (10 mg total) by mouth 3 (three) times daily. (Patient not taking: Reported on 07/07/2017) 30 each 0  . benzonatate (TESSALON PERLES) 100 MG capsule Take 1 capsule (100 mg total) by mouth 3 (three) times daily as needed. (Patient not taking: Reported on 08/30/2017) 20 capsule 0  . fluconazole (DIFLUCAN) 150 MG tablet Take one now and one in a week (Patient not taking: Reported on 08/30/2017) 2 tablet 0  . meloxicam (MOBIC) 15 MG tablet Take 1 tablet (15 mg total) by mouth daily as needed for pain. (Patient not taking: Reported on 07/07/2017) 30 tablet 0  . methylPREDNISolone (MEDROL DOSEPAK) 4 MG TBPK tablet Take 6 pills on day one then decrease by 1 pill each day (Patient not taking: Reported on 07/07/2017) 21 tablet 0  No current facility-administered medications on file prior to visit.     BP 127/87   Pulse 93   Temp 98.2 F (36.8 C)   SpO2 98% chart    Objective:   Physical Exam  Constitutional: She is oriented to person, place, and time. She appears well-developed and well-nourished.  HENT:  Right Ear: External ear normal.  Nose: Nose normal.  Mouth/Throat: Oropharynx is clear and moist.  Neck: Normal range of motion. Neck supple.  Cardiovascular: Normal rate, regular rhythm and normal heart sounds.  Pulmonary/Chest: Effort normal. She has no wheezes.  Course breath sounds that clears with coughing  Neurological: She is alert and oriented to person, place, and time.  Skin: Skin is warm and dry.  Psychiatric: She has a normal  mood and affect.       Assessment:     Jasmine Willis was seen today for chice-Beecher-head congestion x1wk.  Diagnoses and all orders for this visit:  Acute bronchitis due to other specified organisms  Cough  Other orders -     predniSONE (DELTASONE) 20 MG tablet; Take 2 tablets (40 mg total) by mouth daily with breakfast. -     azithromycin (ZITHROMAX) 250 MG tablet; 2 tabs today, then 1 tab daily x 4 more days      Plan:     RTC if symptoms worsen or persist.

## 2017-08-30 NOTE — Patient Instructions (Signed)

## 2017-09-27 ENCOUNTER — Ambulatory Visit (INDEPENDENT_AMBULATORY_CARE_PROVIDER_SITE_OTHER): Payer: Self-pay | Admitting: Nurse Practitioner

## 2017-09-27 VITALS — BP 139/86 | HR 81 | Temp 98.2°F | Wt 220.0 lb

## 2017-09-27 DIAGNOSIS — R05 Cough: Secondary | ICD-10-CM

## 2017-09-27 DIAGNOSIS — R059 Cough, unspecified: Secondary | ICD-10-CM

## 2017-09-27 MED ORDER — PREDNISONE 10 MG (21) PO TBPK: ORAL_TABLET | ORAL | 0 refills | Status: AC

## 2017-09-27 NOTE — Progress Notes (Addendum)
Subjective:     Jasmine OvermanValerie J Willis is a 52 y.o. female here for evaluation of a cough.  The cough is non-productive, without wheezing, dyspnea or hemoptysis and is aggravated by nothing. Onset of symptoms was 2 days ago, unchanged since that time.  Associated symptoms include nasal congestion. Patient does not have a history of asthma. Patient has not had recent travel. Patient does not have a history of smoking. Patient has not taken any medication for her symptoms.  Patient was dxed with bronchitis about one month ago.    The following portions of the patient's history were reviewed and updated as appropriate: allergies, current medications and past medical history.  Review of Systems Constitutional: positive for fatigue, negative for anorexia, chills, fevers, malaise, night sweats and weight loss Eyes: negative Ears, nose, mouth, throat, and face: positive for nasal congestion, negative for ear drainage, earaches, hoarseness and sore throat Respiratory: positive for cough, negative for asthma, dyspnea on exertion, sputum, stridor and wheezing Cardiovascular: negative Neurological: negative Behavioral/Psych: negative     Objective:   BP 139/86   Pulse 81   Temp 98.2 F (36.8 C)   Wt 220 lb (99.8 kg)   SpO2 99%   BMI 34.46 kg/m  General appearance: alert, cooperative, fatigued, flushed and no distress Head: Normocephalic, without obvious abnormality, atraumatic Eyes: conjunctivae/corneas clear. PERRL, EOM's intact. Fundi benign. Ears: normal TM's and external ear canals both ears Nose: clear discharge, turbinates swollen, inflamed, no sinus tenderness Throat: abnormal findings: mild oropharyngeal erythema and no exudates, uvula midline Lungs: clear to auscultation bilaterally Heart: regular rate and rhythm, S1, S2 normal, no murmur, click, rub or gallop Abdomen: abnormal findings:  mild tenderness in the LLQ Pulses: 2+ and symmetric Skin: Skin color, texture, turgor normal. No  rashes or lesions Lymph nodes: cervical lymphadenopathy Neurologic: Grossly normal    Assessment:    Cough    Plan:    Patient given Sterapred Dose Pack for cough for 6 days.  Instructions provided on how to take medications in the mornings with food, medication is a tapering dose.  Patient encouraged to drink fluids and use OTC cough medicine. Patient will follow up if worsening cough, or fever.  Patient verbalizes understanding.

## 2017-09-27 NOTE — Patient Instructions (Addendum)
Cough, Adult  Coughing is a reflex that clears your throat and your airways. Coughing helps to heal and protect your lungs. It is normal to cough occasionally, but a cough that happens with other symptoms or lasts a long time may be a sign of a condition that needs treatment. A cough may last only 2-3 weeks (acute), or it may last longer than 8 weeks (chronic).  What are the causes?  Coughing is commonly caused by:   Breathing in substances that irritate your lungs.   A viral or bacterial respiratory infection.   Allergies.   Asthma.   Postnasal drip.   Smoking.   Acid backing up from the stomach into the esophagus (gastroesophageal reflux).   Certain medicines.   Chronic lung problems, including COPD (or rarely, lung cancer).   Other medical conditions such as heart failure.    Follow these instructions at home:  Pay attention to any changes in your symptoms. Take these actions to help with your discomfort:   Take medicines only as told by your health care provider.  ? If you were prescribed an antibiotic medicine, take it as told by your health care provider. Do not stop taking the antibiotic even if you start to feel better.  ? Talk with your health care provider before you take a cough suppressant medicine.   Drink enough fluid to keep your urine clear or pale yellow.   If the air is dry, use a cold steam vaporizer or humidifier in your bedroom or your home to help loosen secretions.   Avoid anything that causes you to cough at work or at home.   If your cough is worse at night, try sleeping in a semi-upright position.   Avoid cigarette smoke. If you smoke, quit smoking. If you need help quitting, ask your health care provider.   Avoid caffeine.   Avoid alcohol.   Rest as needed.    Contact a health care provider if:   You have new symptoms.   You cough up pus.   Your cough does not get better after 2-3 weeks, or your cough gets worse.   You cannot control your cough with suppressant  medicines and you are losing sleep.   You develop pain that is getting worse or pain that is not controlled with pain medicines.   You have a fever.   You have unexplained weight loss.   You have night sweats.  Get help right away if:   You cough up blood.   You have difficulty breathing.   Your heartbeat is very fast.  This information is not intended to replace advice given to you by your health care provider. Make sure you discuss any questions you have with your health care provider.  Document Released: 02/06/2011 Document Revised: 01/16/2016 Document Reviewed: 10/17/2014  Elsevier Interactive Patient Education  2018 [employer]vier Inc[employer]

## 2017-09-29 MED ORDER — PSEUDOEPH-BROMPHEN-DM 30-2-10 MG/5ML PO SYRP: 5.0000 mL | ORAL_SOLUTION | Freq: Four times a day (QID) | ORAL | 0 refills | Status: AC | PRN

## 2017-09-29 NOTE — Addendum Note (Signed)
Addended by: Lieutenant DiegoHINES, Jeneal Vogl A on: 09/29/2017 09:33 AM   Modules accepted: Orders

## 2017-10-01 ENCOUNTER — Telehealth: Payer: Self-pay | Admitting: Nurse Practitioner

## 2017-10-01 NOTE — Telephone Encounter (Signed)
Medication bromfed was called in via coworker on wed. Patient was informed

## 2018-02-08 ENCOUNTER — Encounter: Payer: Self-pay | Admitting: Family Medicine

## 2018-02-08 ENCOUNTER — Telehealth: Payer: Self-pay | Admitting: Family Medicine

## 2018-02-08 ENCOUNTER — Ambulatory Visit (INDEPENDENT_AMBULATORY_CARE_PROVIDER_SITE_OTHER): Payer: Self-pay | Admitting: Family Medicine

## 2018-02-08 VITALS — BP 120/80 | HR 74 | Temp 98.0°F | Ht 66.0 in | Wt 225.0 lb

## 2018-02-08 DIAGNOSIS — Z0000 Encounter for general adult medical examination without abnormal findings: Secondary | ICD-10-CM

## 2018-02-08 LAB — POCT URINALYSIS DIPSTICK
Bilirubin, UA: NEGATIVE
Glucose, UA: NEGATIVE
Ketones, UA: NEGATIVE
LEUKOCYTES UA: NEGATIVE
NITRITE UA: NEGATIVE
PH UA: 5.5 (ref 5.0–8.0)
PROTEIN UA: NEGATIVE
Spec Grav, UA: >=1.03 — AB (ref 1.010–1.025)
UROBILINOGEN UA: 1 U/dL

## 2018-02-08 NOTE — Telephone Encounter (Signed)
Can she come in Tues 25th at 8am?

## 2018-02-08 NOTE — Progress Notes (Signed)
Subjective:  Jasmine OvermanValerie J Willis is a [age] y.o. female who presents for a wellness visit to meets the requirements of her employer sponsored health benefits. Has a PCP although has not been seen in the primary care setting since 2017 and no screening labs since 2016. She is overdue for a mammogram, PAP, and currently due for colonoscopy. Current Body mass index is 36.32 kg/m. She is inactive of routine physical exercise. She is still having a regular menstrual cycles with her last menstrual period starting 02/08/2018. Patient denies any current health related concerns.   Immunization History  Administered Date(s) Administered  . Influenza,inj,Quad PF,6+ Mos 05/27/2015, 05/19/2016    Past Medical History:  Diagnosis Date  . Anxiety     Past Surgical History:  Procedure Laterality Date  . NO PAST SURGERIES      Social History   Tobacco Use  . Smoking status: Never Smoker  . Smokeless tobacco: Never Used  Substance Use Topics  . Alcohol use: Yes    Alcohol/week: 0.0 oz  . Drug use: No    No Known Allergies  Current Outpatient Medications  Medication Sig Dispense Refill  . amoxicillin (AMOXIL) 875 MG tablet Take 1 tablet (875 mg total) 2 (two) times daily by mouth. (Patient not taking: Reported on 08/30/2017) 20 tablet 0  . azithromycin (ZITHROMAX) 250 MG tablet 2 tabs today, then 1 tab daily x 4 more days (Patient not taking: Reported on 09/27/2017) 6 tablet 0  . baclofen (LIORESAL) 10 MG tablet Take 1 tablet (10 mg total) by mouth 3 (three) times daily. (Patient not taking: Reported on 07/07/2017) 30 each 0  . benzonatate (TESSALON PERLES) 100 MG capsule Take 1 capsule (100 mg total) by mouth 3 (three) times daily as needed. (Patient not taking: Reported on 08/30/2017) 20 capsule 0  . brompheniramine-pseudoephedrine-DM (BROMFED DM) 30-2-10 MG/5ML syrup Take 5 mLs by mouth every 6 (six) hours as needed.    . fluconazole (DIFLUCAN) 150 MG tablet Take one now and one in a week (Patient not  taking: Reported on 08/30/2017) 2 tablet 0  . meloxicam (MOBIC) 15 MG tablet Take 1 tablet (15 mg total) by mouth daily as needed for pain. (Patient not taking: Reported on 07/07/2017) 30 tablet 0  . methylPREDNISolone (MEDROL DOSEPAK) 4 MG TBPK tablet Take 6 pills on day one then decrease by 1 pill each day (Patient not taking: Reported on 07/07/2017) 21 tablet 0  . predniSONE (DELTASONE) 20 MG tablet Take 2 tablets (40 mg total) by mouth daily with breakfast. (Patient not taking: Reported on 09/27/2017) 10 tablet 0   No current facility-administered medications for this visit.     ROS     Objective:  Blood pressure 120/80, pulse 74, temperature 98 F (36.7 C), height 5\' 6"  (1.676 m), weight 225 lb (102.1 kg), last menstrual period 02/08/2018, SpO2 99 %.  General Appearance:  Alert, cooperative, no distress, appears stated age  Head:  Normocephalic, without obvious abnormality, atraumatic  Eyes:  PERRL, conjunctiva/corneas clear, EOM's intact  Ears:  Normal TM's and external ear canals, both ears  Nose: Nares normal, septum midline,mucosa normal, no drainage or sinus tenderness  Throat: Lips, mucosa, and tongue normal; teeth and gums normal  Neck: Supple, symmetrical, trachea midline, no adenopathy;  thyroid: not enlarged, symmetric, no tenderness/mass/nodules; no carotid bruit or JVD  Back:   Symmetric, no curvature, ROM normal, no CVA tenderness  Lungs:   Clear to auscultation bilaterally, respirations unlabored  Heart:  Regular rate and rhythm,  S1 and S2 normal, no murmur, rub, or gallop  Abdomen:   Soft, non-tender, bowel sounds active all four quadrants,  no masses, no organomegaly  Extremities: Extremities normal, atraumatic, no cyanosis or edema  Pulses: 2+ and symmetric  Skin: Skin color, texture, turgor normal, no rashes or lesions  Lymph nodes: Cervical nodes normal  Neurologic: Normal    Assessment:  Wellness Exam     Plan:  Patient education provided.  Recommended  completing overdue health maintenance with PCP and obtaining routine screening labs. Patient verbalized understanding.  Patient will follow up with PCP.   Godfrey Pick. Tiburcio Pea, MSN, FNP-C [facility]  [address]  [place], [place] [address] [phone]

## 2018-02-08 NOTE — Telephone Encounter (Signed)
Copied from CRM [id number]. Topic: Appointment Scheduling - Scheduling Inquiry for Clinic >> Feb 08, 2018  1:28 PM Stephannie LiSimmons, Telesa Jeancharles L, [place] wrote: Reason for CRM: Patient would like a cpe and needs it for insurance purpose by 02/21/18 and she is off work at 300 please call her at [phone]

## 2018-02-08 NOTE — Patient Instructions (Addendum)
Recommend follow-up with a PCP to address the following overdue health maintenance: PAP, Colonoscopy, and Mammogram. You are also in need of recommended fasting screening labs. I recommend that you increase physical activity with a recommended interval of 3-5 days per week with a goal of 150 minutes per week.  Health Maintenance, Female Adopting a healthy lifestyle and getting preventive care can go a long way to promote health and wellness. Talk with your health care provider about what schedule of regular examinations is right for you. This is a good chance for you to check in with your provider about disease prevention and staying healthy. In between checkups, there are plenty of things you can do on your own. Experts have done a lot of research about which lifestyle changes and preventive measures are most likely to keep you healthy. Ask your health care provider for more information. Weight and diet Eat a healthy diet  Be sure to include plenty of vegetables, fruits, low-fat dairy products, and lean protein.  Do not eat a lot of foods high in solid fats, added sugars, or salt.  Get regular exercise. This is one of the most important things you can do for your health. ? Most adults should exercise for at least 150 minutes each week. The exercise should increase your heart rate and make you sweat (moderate-intensity exercise). ? Most adults should also do strengthening exercises at least twice a week. This is in addition to the moderate-intensity exercise.  Maintain a healthy weight  Body mass index (BMI) is a measurement that can be used to identify possible weight problems. It estimates body fat based on height and weight. Your health care provider can help determine your BMI and help you achieve or maintain a healthy weight.  For females 65 years of age and older: ? A BMI below 18.5 is considered underweight. ? A BMI of 18.5 to 24.9 is normal. ? A BMI of 25 to 29.9 is considered  overweight. ? A BMI of 30 and above is considered obese.  Watch levels of cholesterol and blood lipids  You should start having your blood tested for lipids and cholesterol at 50 years of age, then have this test every 5 years.  You may need to have your cholesterol levels checked more often if: ? Your lipid or cholesterol levels are high. ? You are older than 50 years of age. ? You are at high risk for heart disease.  Cancer screening Lung Cancer  Lung cancer screening is recommended for adults 22-39 years old who are at high risk for lung cancer because of a history of smoking.  A yearly low-dose CT scan of the lungs is recommended for people who: ? Currently smoke. ? Have quit within the past 15 years. ? Have at least a 30-pack-year history of smoking. A pack year is smoking an average of one pack of cigarettes a day for 1 year.  Yearly screening should continue until it has been 15 years since you quit.  Yearly screening should stop if you develop a health problem that would prevent you from having lung cancer treatment.  Breast Cancer  Practice breast self-awareness. This means understanding how your breasts normally appear and feel.  It also means doing regular breast self-exams. Let your health care provider know about any changes, no matter how small.  If you are in your 20s or 30s, you should have a clinical breast exam (CBE) by a health care provider every 1-3 years as part of  a regular health exam.  If you are 19 or older, have a CBE every year. Also consider having a breast X-ray (mammogram) every year.  If you have a family history of breast cancer, talk to your health care provider about genetic screening.  If you are at high risk for breast cancer, talk to your health care provider about having an MRI and a mammogram every year.  Breast cancer gene (BRCA) assessment is recommended for women who have family members with BRCA-related cancers. BRCA-related cancers  include: ? Breast. ? Ovarian. ? Tubal. ? Peritoneal cancers.  Results of the assessment will determine the need for genetic counseling and BRCA1 and BRCA2 testing.  Cervical Cancer Your health care provider may recommend that you be screened regularly for cancer of the pelvic organs (ovaries, uterus, and vagina). This screening involves a pelvic examination, including checking for microscopic changes to the surface of your cervix (Pap test). You may be encouraged to have this screening done every 3 years, beginning at age 34.  For women ages 4-65, health care providers may recommend pelvic exams and Pap testing every 3 years, or they may recommend the Pap and pelvic exam, combined with testing for human papilloma virus (HPV), every 5 years. Some types of HPV increase your risk of cervical cancer. Testing for HPV may also be done on women of any age with unclear Pap test results.  Other health care providers may not recommend any screening for nonpregnant women who are considered low risk for pelvic cancer and who do not have symptoms. Ask your health care provider if a screening pelvic exam is right for you.  If you have had past treatment for cervical cancer or a condition that could lead to cancer, you need Pap tests and screening for cancer for at least 20 years after your treatment. If Pap tests have been discontinued, your risk factors (such as having a new sexual partner) need to be reassessed to determine if screening should resume. Some women have medical problems that increase the chance of getting cervical cancer. In these cases, your health care provider may recommend more frequent screening and Pap tests.  Colorectal Cancer  This type of cancer can be detected and often prevented.  Routine colorectal cancer screening usually begins at 50 years of age and continues through 50 years of age.  Your health care provider may recommend screening at an earlier age if you have risk factors  for colon cancer.  Your health care provider may also recommend using home test kits to check for hidden blood in the stool.  A small camera at the end of a tube can be used to examine your colon directly (sigmoidoscopy or colonoscopy). This is done to check for the earliest forms of colorectal cancer.  Routine screening usually begins at age 37.  Direct examination of the colon should be repeated every 5-10 years through 50 years of age. However, you may need to be screened more often if early forms of precancerous polyps or small growths are found.  Skin Cancer  Check your skin from head to toe regularly.  Tell your health care provider about any new moles or changes in moles, especially if there is a change in a mole's shape or color.  Also tell your health care provider if you have a mole that is larger than the size of a pencil eraser.  Always use sunscreen. Apply sunscreen liberally and repeatedly throughout the day.  Protect yourself by wearing long sleeves,  pants, a wide-brimmed hat, and sunglasses whenever you are outside.  Heart disease, diabetes, and high blood pressure  High blood pressure causes heart disease and increases the risk of stroke. High blood pressure is more likely to develop in: ? People who have blood pressure in the high end of the normal range (130-139/85-89 mm Hg). ? People who are overweight or obese. ? People who are African American.  If you are 18-39 years of age, have your blood pressure checked every 3-5 years. If you are 40 years of age or older, have your blood pressure checked every year. You should have your blood pressure measured twice-once when you are at a hospital or clinic, and once when you are not at a hospital or clinic. Record the average of the two measurements. To check your blood pressure when you are not at a hospital or clinic, you can use: ? An automated blood pressure machine at a pharmacy. ? A home blood pressure monitor.  If  you are between 55 years and 79 years old, ask your health care provider if you should take aspirin to prevent strokes.  Have regular diabetes screenings. This involves taking a blood sample to check your fasting blood sugar level. ? If you are at a normal weight and have a low risk for diabetes, have this test once every three years after 50 years of age. ? If you are overweight and have a high risk for diabetes, consider being tested at a younger age or more often. Preventing infection Hepatitis B  If you have a higher risk for hepatitis B, you should be screened for this virus. You are considered at high risk for hepatitis B if: ? You were born in a country where hepatitis B is common. Ask your health care provider which countries are considered high risk. ? Your parents were born in a high-risk country, and you have not been immunized against hepatitis B (hepatitis B vaccine). ? You have HIV or AIDS. ? You use needles to inject street drugs. ? You live with someone who has hepatitis B. ? You have had sex with someone who has hepatitis B. ? You get hemodialysis treatment. ? You take certain medicines for conditions, including cancer, organ transplantation, and autoimmune conditions.  Hepatitis C  Blood testing is recommended for: ? Everyone born from 1945 through 1965. ? Anyone with known risk factors for hepatitis C.  Sexually transmitted infections (STIs)  You should be screened for sexually transmitted infections (STIs) including gonorrhea and chlamydia if: ? You are sexually active and are younger than 50 years of age. ? You are older than 50 years of age and your health care provider tells you that you are at risk for this type of infection. ? Your sexual activity has changed since you were last screened and you are at an increased risk for chlamydia or gonorrhea. Ask your health care provider if you are at risk.  If you do not have HIV, but are at risk, it may be recommended  that you take a prescription medicine daily to prevent HIV infection. This is called pre-exposure prophylaxis (PrEP). You are considered at risk if: ? You are sexually active and do not regularly use condoms or know the HIV status of your partner(s). ? You take drugs by injection. ? You are sexually active with a partner who has HIV.  Talk with your health care provider about whether you are at high risk of being infected with HIV. If you   choose to begin PrEP, you should first be tested for HIV. You should then be tested every 3 months for as long as you are taking PrEP. Pregnancy  If you are premenopausal and you may become pregnant, ask your health care provider about preconception counseling.  If you may become pregnant, take 400 to 800 micrograms (mcg) of folic acid every day.  If you want to prevent pregnancy, talk to your health care provider about birth control (contraception). Osteoporosis and menopause  Osteoporosis is a disease in which the bones lose minerals and strength with aging. This can result in serious bone fractures. Your risk for osteoporosis can be identified using a bone density scan.  If you are 65 years of age or older, or if you are at risk for osteoporosis and fractures, ask your health care provider if you should be screened.  Ask your health care provider whether you should take a calcium or vitamin D supplement to lower your risk for osteoporosis.  Menopause may have certain physical symptoms and risks.  Hormone replacement therapy may reduce some of these symptoms and risks. Talk to your health care provider about whether hormone replacement therapy is right for you. Follow these instructions at home:  Schedule regular health, dental, and eye exams.  Stay current with your immunizations.  Do not use any tobacco products including cigarettes, chewing tobacco, or electronic cigarettes.  If you are pregnant, do not drink alcohol.  If you are  breastfeeding, limit how much and how often you drink alcohol.  Limit alcohol intake to no more than 1 drink per day for nonpregnant women. One drink equals 12 ounces of beer, 5 ounces of wine, or 1 ounces of hard liquor.  Do not use street drugs.  Do not share needles.  Ask your health care provider for help if you need support or information about quitting drugs.  Tell your health care provider if you often feel depressed.  Tell your health care provider if you have ever been abused or do not feel safe at home. This information is not intended to replace advice given to you by your health care provider. Make sure you discuss any questions you have with your health care provider. Document Released: 02/23/2011 Document Revised: 01/16/2016 Document Reviewed: 05/14/2015 Elsevier Interactive Patient Education  [employer].

## 2018-02-08 NOTE — Telephone Encounter (Signed)
See below crm °

## 2018-02-09 NOTE — Telephone Encounter (Signed)
Spoke with pt she stated she got her cpx @ [facility]. Employee health

## 2018-03-21 ENCOUNTER — Telehealth: Payer: 59 | Admitting: Family

## 2018-03-21 DIAGNOSIS — M5440 Lumbago with sciatica, unspecified side: Secondary | ICD-10-CM

## 2018-03-21 MED ORDER — BACLOFEN 10 MG PO TABS: 10.0000 mg | ORAL_TABLET | Freq: Three times a day (TID) | ORAL | 0 refills | Status: DC

## 2018-03-21 MED ORDER — NAPROXEN 500 MG PO TABS: 500.0000 mg | ORAL_TABLET | Freq: Two times a day (BID) | ORAL | 0 refills | Status: DC

## 2018-03-21 NOTE — Progress Notes (Signed)

## 2018-05-11 ENCOUNTER — Ambulatory Visit (INDEPENDENT_AMBULATORY_CARE_PROVIDER_SITE_OTHER): Payer: Self-pay | Admitting: Physician Assistant

## 2018-05-11 VITALS — BP 120/80 | HR 72 | Temp 98.0°F | Wt 223.0 lb

## 2018-05-11 DIAGNOSIS — G8929 Other chronic pain: Secondary | ICD-10-CM

## 2018-05-11 DIAGNOSIS — M1711 Unilateral primary osteoarthritis, right knee: Secondary | ICD-10-CM

## 2018-05-11 DIAGNOSIS — M545 Low back pain: Secondary | ICD-10-CM

## 2018-05-11 MED ORDER — NAPROXEN 500 MG PO TABS: 500.0000 mg | ORAL_TABLET | Freq: Two times a day (BID) | ORAL | 0 refills | Status: AC

## 2018-05-11 MED ORDER — BACLOFEN 10 MG PO TABS: 10.0000 mg | ORAL_TABLET | Freq: Two times a day (BID) | ORAL | 0 refills | Status: AC

## 2018-05-11 NOTE — Patient Instructions (Addendum)
Take Naproxen, anti-inflammatory and pain medication, as prescribed. 1 tab twice a day.Take with food to prevent stomach upset. Long term use of NSAIDs (non-steroidal anti-inflammatories) can cause kidney issues. Recommend you follow-up with your family practice provider for further evaluation.  Take Baclofen, muscle relaxer, as prescribed. 1 tab twice a day. May cause somnolence / tiredness. Do not take prior to work or driving. Best to take at night before bed.  Continue to use over the counter Bengay, IcyHot, or BioFreeze. May continue to apply warm compresses.  Given AAOS handout for stretching exercises for knee and back. Discussed formal physical therapy course.  Follow up with family physician and/or orthopedist in regards to chronic right knee and low back pain.   Back Pain, Adult Many adults have back pain from time to time. Common causes of back pain include:  A strained muscle or ligament.  Wear and tear (degeneration) of the spinal disks.  Arthritis.  A hit to the back.  Back pain can be short-lived (acute) or last a long time (chronic). A physical exam, lab tests, and imaging studies may be done to find the cause of your pain. Follow these instructions at home: Managing pain and stiffness  Take over-the-counter and prescription medicines only as told by your health care provider.  If directed, apply heat to the affected area as often as told by your health care provider. Use the heat source that your health care provider recommends, such as a moist heat pack or a heating pad. ? Place a towel between your skin and the heat source. ? Leave the heat on for 20-30 minutes. ? Remove the heat if your skin turns bright red. This is especially important if you are unable to feel pain, heat, or cold. You have a greater risk of getting burned.  If directed, apply ice to the injured area: ? Put ice in a plastic bag. ? Place a towel between your skin and the bag. ? Leave the  ice on for 20 minutes, 2-3 times a day for the first 2-3 days. Activity  Do not stay in bed. Resting more than 1-2 days can delay your recovery.  Take short walks on even surfaces as soon as you are able. Try to increase the length of time you walk each day.  Do not sit, drive, or stand in one place for more than 30 minutes at a time. Sitting or standing for long periods of time can put stress on your back.  Use proper lifting techniques. When you bend and lift, use positions that put less stress on your back: ? [place]field[place]end your knees. ? Keep the load close to your body. ? Avoid twisting.  Exercise regularly as told by your health care provider. Exercising will help your back heal faster. This also helps prevent back injuries by keeping muscles strong and flexible.  Your health care provider may recommend that you see a physical therapist. This person can help you come up with a safe exercise program. Do any exercises as told by your physical therapist. Lifestyle  Maintain a healthy weight. Extra weight puts stress on your back and makes it difficult to have good posture.  Avoid activities or situations that make you feel anxious or stressed. Learn ways to manage anxiety and stress. One way to manage stress is through exercise. Stress and anxiety increase muscle tension and can make back pain worse. General instructions  Sleep on a firm mattress in a comfortable position. Try lying on your side  with your knees slightly bent. If you lie on your back, put a pillow under your knees.  Follow your treatment plan as told by your health care provider. This may include: ? Cognitive or behavioral therapy. ? Acupuncture or massage therapy. ? Meditation or yoga. Contact a health care provider if:  You have pain that is not relieved with rest or medicine.  You have increasing pain going down into your legs or buttocks.  Your pain does not improve in 2 weeks.  You have pain at night.  You lose  weight.  You have a fever or chills. Get help right away if:  You develop new bowel or bladder control problems.  You have unusual weakness or numbness in your arms or legs.  You develop nausea or vomiting.  You develop abdominal pain.  You feel faint. Summary  Many adults have back pain from time to time. A physical exam, lab tests, and imaging studies may be done to find the cause of your pain.  Use proper lifting techniques. When you bend and lift, use positions that put less stress on your back.  Take over-the-counter and prescription medicines and apply heat or ice as directed by your health care provider. This information is not intended to replace advice given to you by your health care provider. Make sure you discuss any questions you have with your health care provider. Document Released: 08/10/2005 Document Revised: 09/14/2016 Document Reviewed: 09/14/2016 Elsevier Interactive Patient Education  2018 [employer].   Knee Pain, Adult Knee pain in adults is common. It can be caused by many things, including:  Arthritis.  A fluid-filled sac (cyst) or growth in your knee.  An infection in your knee.  An injury that will not heal.  Damage, swelling, or irritation of the tissues that support your knee.  Knee pain is usually not a sign of a serious problem. The pain may go away on its own with time and rest. If it does not, a health care provider may order tests to find the cause of the pain. These may include:  Imaging tests, such as an X-ray, MRI, or ultrasound.  Joint aspiration. In this test, fluid is removed from the knee.  Arthroscopy. In this test, a lighted tube is inserted into knee and an image is projected onto a TV screen.  A biopsy. In this test, a sample of tissue is removed from the body and studied under a microscope.  Follow these instructions at home: Pay attention to any changes in your symptoms. Take these actions to relieve your  pain. Activity  Rest your knee.  Do not do things that cause pain or make pain worse.  Avoid high-impact activities or exercises, such as running, jumping rope, or doing jumping jacks. General instructions  Take over-the-counter and prescription medicines only as told by your health care provider.  Raise (elevate) your knee above the level of your heart when you are sitting or lying down.  Sleep with a pillow under your knee.  If directed, apply ice to the knee: ? Put ice in a plastic bag. ? Place a towel between your skin and the bag. ? Leave the ice on for 20 minutes, 2-3 times a day.  Ask your health care provider if you should wear an elastic knee support.  Lose weight if you are overweight. Extra weight can put pressure on your knee.  Do not use any products that contain nicotine or tobacco, such as cigarettes and e-cigarettes. Smoking may  slow the healing of any bone and joint problems that you may have. If you need help quitting, ask your health care provider. Contact a health care provider if:  Your knee pain continues, changes, or gets worse.  You have a fever along with knee pain.  Your knee buckles or locks up.  Your knee swells, and the swelling becomes worse. Get help right away if:  Your knee feels warm to the touch.  You cannot move your knee.  You have severe pain in your knee.  You have chest pain.  You have trouble breathing. Summary  Knee pain in adults is common. It can be caused by many things, including, arthritis, infection, cysts, or injury.  Knee pain is usually not a sign of a serious problem, but if it does not go away, a health care provider may perform tests to know the cause of the pain.  Pay attention to any changes in your symptoms. Relieve your pain with rest, medicines, light activity, and use of ice.  Get help if your pain continues or becomes very severe, or if your knee buckles or locks up, or if you have chest pain or trouble  breathing. This information is not intended to replace advice given to you by your health care provider. Make sure you discuss any questions you have with your health care provider. Document Released: 06/07/2007 Document Revised: 07/31/2016 Document Reviewed: 07/31/2016 Elsevier Interactive Patient Education  [employer].

## 2018-05-11 NOTE — Progress Notes (Signed)
Patient ID: [name] DOB: [birth year] AGE: 52 y.o. MRN: [id number]   Primary Care Provider: Eustaquio Boyden, MD    Subjective:  HPI  Jasmine Willis is a 52 y.o. female presents for evaluation of right knee pain and low back pain.  Patient reports two year history of right knee pain. Originally seen by Jasmine Willis with [facility]; on 06/05/2016. Diagnosed with chronic right knee pain. Right knee x-ray performed, revealed loss of medial joint space. Advised on weight loss, using a brace, and prescribed Meloxicam.  Patient states pain has continued. Never initiated course of PT. Has continued to take over the counter ibuprofen intermittently. Patient wearing comfortable supportive shoes; stands all day for work.  Describes knee pain as ache. Anterior aspect of knee. Used to have locking episodes and giving out/buckling episodes. Does not any more. Occasionally has grinding sensation. Intermittent edema. Denies radiating pain. Denies leg weakness or paresthesias. Denies new injury/trauma.  Patient also with long standing low back pain. States she feels she's had low back pain all of her life. Describes as soreness. Located midline low back. Minimal extension to right and left side. Denies radiation of pain. Feels better with back extension. 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 Patient denies previous history of lumbar spine imaging. Was treated on 03/21/2018 via E-visit for back pain. Prescribed Naproxen and Baclofen. Felt medication significantly helped with both knee and back pain. Denies saddle anesthesia, bowel/bladder incontinence, urinary retention.  10 point review of systems was completed and is negative unless otherwise stated.  The following portions of the patient's history were reviewed and updated as appropriate: allergies, current medications and past medical history.  Patient Active Problem List    Diagnosis Date Noted  . Health maintenance examination 08/09/2015  . Obesity, Class I, BMI 30-34.9 08/09/2015  . Anxiety     No Known Allergies  Current Outpatient Medications on File Prior to Visit  Medication Sig Dispense Refill  . amoxicillin (AMOXIL) 875 MG tablet Take 1 tablet (875 mg total) 2 (two) times daily by mouth. (Patient not taking: Reported on 08/30/2017) 20 tablet 0  . azithromycin (ZITHROMAX) 250 MG tablet 2 tabs today, then 1 tab daily x 4 more days (Patient not taking: Reported on 09/27/2017) 6 tablet 0  . baclofen (LIORESAL) 10 MG tablet Take 1 tablet (10 mg total) by mouth 3 (three) times daily. (Patient not taking: Reported on 05/11/2018) 30 each 0  . benzonatate (TESSALON PERLES) 100 MG capsule Take 1 capsule (100 mg total) by mouth 3 (three) times daily as needed. (Patient not taking: Reported on 08/30/2017) 20 capsule 0  . brompheniramine-pseudoephedrine-DM (BROMFED DM) 30-2-10 MG/5ML syrup Take 5 mLs by mouth every 6 (six) hours as needed.    . fluconazole (DIFLUCAN) 150 MG tablet Take one now and one in a week (Patient not taking: Reported on 08/30/2017) 2 tablet 0  . meloxicam (MOBIC) 15 MG tablet Take 1 tablet (15 mg total) by mouth daily as needed for pain. (Patient not taking: Reported on 07/07/2017) 30 tablet 0  . methylPREDNISolone (MEDROL DOSEPAK) 4 MG TBPK tablet Take 6 pills on day one then decrease by 1 pill each day (Patient not taking: Reported on 07/07/2017) 21 tablet 0  . naproxen (NAPROSYN) 500 MG tablet Take 1 tablet (500 mg total) by mouth 2 (two) times daily with a meal. (Patient not taking: Reported on 05/11/2018) 30 tablet 0  . predniSONE (DELTASONE)  20 MG tablet Take 2 tablets (40 mg total) by mouth daily with breakfast. (Patient not taking: Reported on 09/27/2017) 10 tablet 0   No current facility-administered medications on file prior to visit.     Objective:  Vitals:   05/11/18 1536  BP: 120/80  Pulse: 72  Temp: 98 F (36.7 C)  SpO2: 98%      Wt Readings from Last 3 Encounters:  05/11/18 223 lb (101.2 kg)  02/08/18 225 lb (102.1 kg)  09/27/17 220 lb (99.8 kg)    BP 120/80   Pulse 72   Temp 98 F (36.7 C)   Wt 223 lb (101.2 kg)   LMP 04/20/2018   SpO2 98%   BMI 35.99 kg/m   General Appearance:  Alert, cooperative, no distress, appears stated age  Head:  Normocephalic, without obvious abnormality, atraumatic  Eyes:  PERRL, conjunctiva/corneas clear, EOM's intact, fundi benign, both eyes  Ears:  Normal TM's and external ear canals, both ears  Nose: Nares normal, septum midline,mucosa normal, no drainage or sinus tenderness  Throat: Lips, mucosa, and tongue normal; teeth and gums normal  Neck: Supple, symmetrical, trachea midline, no adenopathy;  thyroid: not enlarged, symmetric, no tenderness/mass/nodules; no carotid bruit or JVD  Back:   Symmetric, no curvature, ROM normal; pain with flexion. Relief with lateral flexion and extension. Negative straight leg raise. Minimal tenderness with palpation over midline lower lumbar spine. No palpable paraspinal muscle tightness/spasm.  Lungs:   Clear to auscultation bilaterally, respirations unlabored  Breasts:  No masses or tenderness  Heart:  Regular rate and rhythm  Abdomen:   Soft, non-tender, bowel sounds active all four quadrants,  no masses, no organomegaly  Extremities: Left knee normal to inspection. No tenderness with palpation. Full ROM. 5/5 motor strength. Right knee reveals minimal edema compared to left knee. Full ROM; pain with full extension and full flexion. 5/5 motor strength. No laxity; however pain with valgus and varus stress testing. Negative McMurray's test. Negative anterior and posterior drawer testing. No gait abnormality.  Pulses: 2+ and symmetric  Skin: Skin color, texture, turgor normal, no rashes or lesions  Lymph nodes: Cervical, supraclavicular, and axillary nodes normal  Neurologic: Normal    Assessment & Plan:  1. Arthritis of right knee  -  naproxen (NAPROSYN) 500 MG tablet; Take 1 tablet (500 mg total) by mouth 2 (two) times daily with a meal for 14 days.  Dispense: 28 tablet; Refill: 0 - baclofen (LIORESAL) 10 MG tablet; Take 1 tablet (10 mg total) by mouth 2 (two) times daily for 14 days.  Dispense: 28 each; Refill: 0  2. Chronic midline low back pain without sciatica  - naproxen (NAPROSYN) 500 MG tablet; Take 1 tablet (500 mg total) by mouth 2 (two) times daily with a meal for 14 days.  Dispense: 28 tablet; Refill: 0  Patient with chronic right knee and low back pain. Discussed that [facility] was not the ideal location for management of chronic conditions. Advised patient follow-up with PCP and/or orthopedist. Advised patient start course of physical therapy (may need script from PCP or orthopedist).   Agreed to refill Naproxen and Baclofen. Discussed Naproxen (along with other NSAIDs) can effect kidney health. Patient's last kidney function testing was in 2016. Advised patient be seen by PCP for regular management of health (previously prescribed steroids, no recent BG or A1C).  If symptom worsen or do not improve, return for follow-up with PCP, or at the emergency department if severity of symptoms warrant a higher  level of care.   Jasmine Willis, MHS, PA-C Advanced Practice Provider [facility]  [facility]  [address], [facility], [address]st Floor [place], [place] [address] (p):  [phone] [email or web address]

## 2018-06-07 DIAGNOSIS — R208 Other disturbances of skin sensation: Secondary | ICD-10-CM | POA: Diagnosis not present

## 2018-06-07 DIAGNOSIS — B078 Other viral warts: Secondary | ICD-10-CM | POA: Diagnosis not present

## 2018-06-07 DIAGNOSIS — R238 Other skin changes: Secondary | ICD-10-CM | POA: Diagnosis not present

## 2018-06-07 DIAGNOSIS — L821 Other seborrheic keratosis: Secondary | ICD-10-CM | POA: Diagnosis not present

## 2018-06-15 ENCOUNTER — Ambulatory Visit: Payer: Self-pay | Admitting: Family Medicine

## 2018-06-21 NOTE — Progress Notes (Signed)
BP 122/80 (BP Location: Left Arm, Patient Position: Sitting, Cuff Size: Large)   Pulse 75   Temp 97.9 F (36.6 C) (Oral)   Ht 5\' 6"  (1.676 m)   Wt 219 lb 4 oz (99.5 kg)   LMP 06/18/2018   SpO2 98%   BMI 35.39 kg/m    CC: several issues Subjective:    [name], female    DOB: [birth year], 52 y.o.   MRN: [id number]  HPI: Jasmine Willis is a 52 y.o. female presenting on 06/22/2018 for Anxiety (States she has family stress and is gaining wt. Wants to discuss rx for wt. Wants iron labs.) and Knee Pain (C/o right knee pain. Wants to discuss starting meds. )   Last seen here 07/2015 to establish care.  Had CPE for insurance purposes through [facility] 01/2018. Requests labwork today.  Has been unable to give blood because of low iron - would like this checked.   Takes meloxicam intermittently for ongoing knee pain.   Lots of family stressors: Sister in [place] dying of liver disease.  Husband with CAD/bypass she also takes care of.  Father in hospital in [place].  Weight gain noted - attibutes to increase stress. Discussed caregiver burden. Limited exercise at this time.   LMP regular, currently finishing period.  Colon cancer screening - discussed options - requests iFOB.   Relevant past medical, surgical, family and social history reviewed and updated as indicated. Interim medical history since our last visit reviewed. Allergies and medications reviewed and updated. Outpatient Medications Prior to Visit  Medication Sig Dispense Refill  . meloxicam (MOBIC) 15 MG tablet Take 1 tablet (15 mg total) by mouth daily as needed for pain. 30 tablet 0  . amoxicillin (AMOXIL) 875 MG tablet Take 1 tablet (875 mg total) 2 (two) times daily by mouth. (Patient not taking: Reported on 08/30/2017) 20 tablet 0  . azithromycin (ZITHROMAX) 250 MG tablet 2 tabs today, then 1 tab daily x 4 more days (Patient not taking: Reported on 09/27/2017) 6 tablet 0  . benzonatate (TESSALON PERLES) 100  MG capsule Take 1 capsule (100 mg total) by mouth 3 (three) times daily as needed. (Patient not taking: Reported on 08/30/2017) 20 capsule 0  . brompheniramine-pseudoephedrine-DM (BROMFED DM) 30-2-10 MG/5ML syrup Take 5 mLs by mouth every 6 (six) hours as needed.    . fluconazole (DIFLUCAN) 150 MG tablet Take one now and one in a week (Patient not taking: Reported on 08/30/2017) 2 tablet 0  . methylPREDNISolone (MEDROL DOSEPAK) 4 MG TBPK tablet Take 6 pills on day one then decrease by 1 pill each day (Patient not taking: Reported on 07/07/2017) 21 tablet 0  . predniSONE (DELTASONE) 20 MG tablet Take 2 tablets (40 mg total) by mouth daily with breakfast. (Patient not taking: Reported on 09/27/2017) 10 tablet 0   No facility-administered medications prior to visit.      Per HPI unless specifically indicated in ROS section below Review of Systems     Objective:    BP 122/80 (BP Location: Left Arm, Patient Position: Sitting, Cuff Size: Large)   Pulse 75   Temp 97.9 F (36.6 C) (Oral)   Ht 5\' 6"  (1.676 m)   Wt 219 lb 4 oz (99.5 kg)   LMP 06/18/2018   SpO2 98%   BMI 35.39 kg/m   Wt Readings from Last 3 Encounters:  06/22/18 219 lb 4 oz (99.5 kg)  05/11/18 223 lb (101.2 kg)  02/08/18 225 lb (102.1  kg)    Physical Exam  Constitutional: She appears well-developed and well-nourished. No distress.  HENT:  Mouth/Throat: Oropharynx is clear and moist. No oropharyngeal exudate.  Cardiovascular: Normal rate, regular rhythm and normal heart sounds.  No murmur heard. Pulmonary/Chest: Effort normal and breath sounds normal. No respiratory distress. She has no wheezes. She has no rales.  Musculoskeletal: She exhibits no edema.  Psychiatric: She has a normal mood and affect.  Nursing note and vitals reviewed.  Results for orders placed or performed in visit on 06/22/18  Lipid panel  Result Value Ref Range   Cholesterol 176 0 - 200 mg/dL   Triglycerides 696.2 0.0 - 149.0 mg/dL   HDL 95.28 >41.32  mg/dL   VLDL 44.0 0.0 - 10.2 mg/dL   LDL Cholesterol 725 (H) 0 - 99 mg/dL   Total CHOL/HDL Ratio 4    NonHDL 132.16   TSH  Result Value Ref Range   TSH 2.19 0.35 - 4.50 uIU/mL  CBC with Differential/Platelet  Result Value Ref Range   WBC 7.6 4.0 - 10.5 K/uL   RBC 4.34 3.87 - 5.11 Mil/uL   Hemoglobin 11.7 (L) 12.0 - 15.0 g/dL   HCT 36.6 (L) 44.0 - 34.7 %   MCV 80.3 78.0 - 100.0 fl   MCHC 33.7 30.0 - 36.0 g/dL   RDW 42.5 (H) 95.6 - 38.7 %   Platelets 318.0 150.0 - 400.0 K/uL   Neutrophils Relative % 49.6 43.0 - 77.0 %   Lymphocytes Relative 39.5 12.0 - 46.0 %   Monocytes Relative 6.2 3.0 - 12.0 %   Eosinophils Relative 4.5 0.0 - 5.0 %   Basophils Relative 0.2 0.0 - 3.0 %   Neutro Abs 3.8 1.4 - 7.7 K/uL   Lymphs Abs 3.0 0.7 - 4.0 K/uL   Monocytes Absolute 0.5 0.1 - 1.0 K/uL   Eosinophils Absolute 0.3 0.0 - 0.7 K/uL   Basophils Absolute 0.0 0.0 - 0.1 K/uL  Basic metabolic panel  Result Value Ref Range   Sodium 138 135 - 145 mEq/L   Potassium 4.2 3.5 - 5.1 mEq/L   Chloride 106 96 - 112 mEq/L   CO2 26 19 - 32 mEq/L   Glucose, Bld 91 70 - 99 mg/dL   BUN 15 6 - 23 mg/dL   Creatinine, Ser 5.64 0.40 - 1.20 mg/dL   Calcium 9.0 8.4 - 33.2 mg/dL   GFR 95.18 >84.16 mL/min  IBC panel  Result Value Ref Range   Iron 31 (L) 42 - 145 ug/dL   Transferrin 606.3 016.0 - 360.0 mg/dL   Saturation Ratios 7.6 (L) 20.0 - 50.0 %  Ferritin  Result Value Ref Range   Ferritin 8.8 (L) 10.0 - 291.0 ng/mL  Vitamin B12  Result Value Ref Range   Vitamin B-12 288 211 - 911 pg/mL  VITAMIN D 25 Hydroxy (Vit-D Deficiency, Fractures)  Result Value Ref Range   VITD 22.54 (L) 30.00 - 100.00 ng/mL      Assessment & Plan:   Problem List Items Addressed This Visit    Severe obesity (BMI 35.0-39.9) with comorbidity (HCC)    Work and family stress contributing to lack of dietary and activity follow through, weight gain. Discussed healthy diet and lifestyle changes to affect sustainable weight loss.  Discussed possible bariatric medication options - specifically belviq and contrave - she checked with pharmacy and said contrave may be affordable if needed.       Primary osteoarthritis of right knee    Ongoing discomfort -  will refill meloxicam which is effective.       Relevant Medications   meloxicam (MOBIC) 15 MG tablet   Low iron    Update labs - pt states unable to give blood due to low iron levels. This may contribute to fatigue.       Relevant Orders   CBC with Differential/Platelet (Completed)   IBC panel (Completed)   Ferritin (Completed)   Fatigue - Primary    Check for reversible causes of fatigue - which may be contributing to low energy.       Relevant Orders   TSH (Completed)   CBC with Differential/Platelet (Completed)   Basic metabolic panel (Completed)   Vitamin B12 (Completed)   VITAMIN D 25 Hydroxy (Vit-D Deficiency, Fractures) (Completed)   Anxiety    Ongoing family stressors.  Support provided. Discussed avoiding caregiver burnout.        Other Visit Diagnoses    Special screening for malignant neoplasms, colon       Relevant Orders   Fecal occult blood, imunochemical   Lipid screening       Relevant Orders   Lipid panel (Completed)       Meds ordered this encounter  Medications  . meloxicam (MOBIC) 15 MG tablet    Sig: Take 1 tablet (15 mg total) by mouth daily as needed for pain.    Dispense:  30 tablet    Refill:  0   Orders Placed This Encounter  Procedures  . Fecal occult blood, imunochemical    Standing Status:   Future    Standing Expiration Date:   06/23/2019  . Lipid panel  . TSH  . CBC with Differential/Platelet  . Basic metabolic panel  . IBC panel  . Ferritin  . Vitamin B12  . VITAMIN D 25 Hydroxy (Vit-D Deficiency, Fractures)    Follow up plan: No follow-ups on file.  Eustaquio Boyden, MD

## 2018-06-22 ENCOUNTER — Ambulatory Visit: Payer: 59 | Admitting: Family Medicine

## 2018-06-22 ENCOUNTER — Encounter: Payer: Self-pay | Admitting: Family Medicine

## 2018-06-22 VITALS — BP 122/80 | HR 75 | Temp 97.9°F | Ht 66.0 in | Wt 219.2 lb

## 2018-06-22 DIAGNOSIS — M1711 Unilateral primary osteoarthritis, right knee: Secondary | ICD-10-CM | POA: Diagnosis not present

## 2018-06-22 DIAGNOSIS — Z13220 Encounter for screening for lipoid disorders: Secondary | ICD-10-CM | POA: Diagnosis not present

## 2018-06-22 DIAGNOSIS — R5383 Other fatigue: Secondary | ICD-10-CM | POA: Diagnosis not present

## 2018-06-22 DIAGNOSIS — E611 Iron deficiency: Secondary | ICD-10-CM | POA: Diagnosis not present

## 2018-06-22 DIAGNOSIS — Z1211 Encounter for screening for malignant neoplasm of colon: Secondary | ICD-10-CM

## 2018-06-22 DIAGNOSIS — F419 Anxiety disorder, unspecified: Secondary | ICD-10-CM

## 2018-06-22 MED ORDER — MELOXICAM 15 MG PO TABS: 15.0000 mg | ORAL_TABLET | Freq: Every day | ORAL | 0 refills | Status: DC | PRN

## 2018-06-22 NOTE — Patient Instructions (Addendum)
Pass by lab to pick up stool kit. Labs today.  Look into belviq or contrave cost.  If we start medication, schedule 1 month f/u visit.

## 2018-06-23 ENCOUNTER — Other Ambulatory Visit: Payer: Self-pay | Admitting: Family Medicine

## 2018-06-23 ENCOUNTER — Encounter: Payer: Self-pay | Admitting: Family Medicine

## 2018-06-23 DIAGNOSIS — D509 Iron deficiency anemia, unspecified: Secondary | ICD-10-CM | POA: Insufficient documentation

## 2018-06-23 DIAGNOSIS — M1711 Unilateral primary osteoarthritis, right knee: Secondary | ICD-10-CM | POA: Insufficient documentation

## 2018-06-23 DIAGNOSIS — R5383 Other fatigue: Secondary | ICD-10-CM | POA: Insufficient documentation

## 2018-06-23 DIAGNOSIS — E611 Iron deficiency: Secondary | ICD-10-CM

## 2018-06-23 HISTORY — DX: Unilateral primary osteoarthritis, right knee: M17.11

## 2018-06-23 LAB — LIPID PANEL
CHOL/HDL RATIO: 4
Cholesterol: 176 mg/dL (ref 0–200)
HDL: 43.9 mg/dL (ref >39.00)
LDL CALC: 110 mg/dL — AB (ref 0–99)
NONHDL: 132.16
Triglycerides: 111 mg/dL (ref 0.0–149.0)
VLDL: 22.2 mg/dL (ref 0.0–40.0)

## 2018-06-23 LAB — BASIC METABOLIC PANEL
BUN: 15 mg/dL (ref 6–23)
CHLORIDE: 106 meq/L (ref 96–112)
CO2: 26 mEq/L (ref 19–32)
Calcium: 9 mg/dL (ref 8.4–10.5)
Creatinine, Ser: 0.99 mg/dL (ref 0.40–1.20)
GFR: 62.99 mL/min (ref >60.00)
Glucose, Bld: 91 mg/dL (ref 70–99)
POTASSIUM: 4.2 meq/L (ref 3.5–5.1)
SODIUM: 138 meq/L (ref 135–145)

## 2018-06-23 LAB — CBC WITH DIFFERENTIAL/PLATELET
BASOS ABS: 0 10*3/uL (ref 0.0–0.1)
BASOS PCT: 0.2 % (ref 0.0–3.0)
EOS ABS: 0.3 10*3/uL (ref 0.0–0.7)
Eosinophils Relative: 4.5 % (ref 0.0–5.0)
HCT: 34.9 % — ABNORMAL LOW (ref 36.0–46.0)
Hemoglobin: 11.7 g/dL — ABNORMAL LOW (ref 12.0–15.0)
LYMPHS ABS: 3 10*3/uL (ref 0.7–4.0)
LYMPHS PCT: 39.5 % (ref 12.0–46.0)
MCHC: 33.7 g/dL (ref 30.0–36.0)
MCV: 80.3 fl (ref 78.0–100.0)
MONO ABS: 0.5 10*3/uL (ref 0.1–1.0)
Monocytes Relative: 6.2 % (ref 3.0–12.0)
NEUTROS ABS: 3.8 10*3/uL (ref 1.4–7.7)
NEUTROS PCT: 49.6 % (ref 43.0–77.0)
PLATELETS: 318 10*3/uL (ref 150.0–400.0)
RBC: 4.34 Mil/uL (ref 3.87–5.11)
RDW: 15.6 % — AB (ref 11.5–15.5)
WBC: 7.6 10*3/uL (ref 4.0–10.5)

## 2018-06-23 LAB — IBC PANEL
IRON: 31 ug/dL — AB (ref 42–145)
Saturation Ratios: 7.6 % — ABNORMAL LOW (ref 20.0–50.0)
TRANSFERRIN: 293 mg/dL (ref 212.0–360.0)

## 2018-06-23 LAB — TSH: TSH: 2.19 u[IU]/mL (ref 0.35–4.50)

## 2018-06-23 LAB — FERRITIN: FERRITIN: 8.8 ng/mL — AB (ref 10.0–291.0)

## 2018-06-23 LAB — VITAMIN B12: VITAMIN B 12: 288 pg/mL (ref 211–911)

## 2018-06-23 LAB — VITAMIN D 25 HYDROXY (VIT D DEFICIENCY, FRACTURES): VITD: 22.54 ng/mL — ABNORMAL LOW (ref 30.00–100.00)

## 2018-06-23 MED ORDER — VITAMIN D3 25 MCG (1000 UT) PO CAPS: 1.0000 | ORAL_CAPSULE | Freq: Every day | ORAL | Status: DC

## 2018-06-23 MED ORDER — FERROUS SULFATE 325 (65 FE) MG PO TABS: 325.0000 mg | ORAL_TABLET | ORAL | 3 refills | Status: DC

## 2018-06-23 MED ORDER — B-12 1000 MCG SL SUBL: 1.0000 | SUBLINGUAL_TABLET | Freq: Every day | SUBLINGUAL | Status: DC

## 2018-06-23 NOTE — Assessment & Plan Note (Signed)
Ongoing family stressors.  Support provided. Discussed avoiding caregiver burnout.

## 2018-06-23 NOTE — Assessment & Plan Note (Signed)
Work and family stress contributing to lack of dietary and activity follow through, weight gain. Discussed healthy diet and lifestyle changes to affect sustainable weight loss. Discussed possible bariatric medication options - specifically belviq and contrave - she checked with pharmacy and said contrave may be affordable if needed.

## 2018-06-23 NOTE — Assessment & Plan Note (Signed)
Update labs - pt states unable to give blood due to low iron levels. This may contribute to fatigue.

## 2018-06-23 NOTE — Assessment & Plan Note (Signed)
Ongoing discomfort - will refill meloxicam which is effective.

## 2018-06-23 NOTE — Assessment & Plan Note (Addendum)
Check for reversible causes of fatigue - which may be contributing to low energy.

## 2018-07-11 ENCOUNTER — Encounter: Payer: Self-pay | Admitting: Family Medicine

## 2018-09-07 ENCOUNTER — Telehealth: Payer: 59 | Admitting: Nurse Practitioner

## 2018-09-07 DIAGNOSIS — M545 Low back pain, unspecified: Secondary | ICD-10-CM

## 2018-09-07 MED ORDER — NAPROXEN 500 MG PO TABS: 500.0000 mg | ORAL_TABLET | Freq: Two times a day (BID) | ORAL | 1 refills | Status: DC

## 2018-09-07 MED ORDER — CYCLOBENZAPRINE HCL 10 MG PO TABS: 10.0000 mg | ORAL_TABLET | Freq: Three times a day (TID) | ORAL | 1 refills | Status: DC | PRN

## 2018-09-07 NOTE — Progress Notes (Signed)

## 2018-11-07 ENCOUNTER — Other Ambulatory Visit: Payer: Self-pay | Admitting: Family Medicine

## 2018-11-07 ENCOUNTER — Encounter: Payer: Self-pay | Admitting: Family Medicine

## 2018-11-07 DIAGNOSIS — M1711 Unilateral primary osteoarthritis, right knee: Secondary | ICD-10-CM

## 2018-11-07 MED ORDER — FLUCONAZOLE 150 MG PO TABS: 150.0000 mg | ORAL_TABLET | Freq: Once | ORAL | 0 refills | Status: AC

## 2018-11-07 NOTE — Telephone Encounter (Signed)
Dr. G, do you want pt to come in for OV? ?

## 2018-11-07 NOTE — Addendum Note (Signed)
Addended by: Eustaquio Boyden on: 11/07/2018 09:12 AM   Modules accepted: Orders

## 2018-11-08 MED ORDER — MELOXICAM 15 MG PO TABS: 15.0000 mg | ORAL_TABLET | Freq: Every day | ORAL | 0 refills | Status: DC | PRN

## 2018-11-08 NOTE — Telephone Encounter (Signed)
Last office visit 06/22/2018 for anxiety/knee pain.  Last refilled 06/22/2018 for #30 with no refills.  No future appointments.

## 2019-01-12 ENCOUNTER — Other Ambulatory Visit: Payer: Self-pay | Admitting: Family Medicine

## 2019-01-12 ENCOUNTER — Telehealth: Payer: 59 | Admitting: Family

## 2019-01-12 DIAGNOSIS — M5442 Lumbago with sciatica, left side: Secondary | ICD-10-CM | POA: Diagnosis not present

## 2019-01-12 DIAGNOSIS — M1711 Unilateral primary osteoarthritis, right knee: Secondary | ICD-10-CM

## 2019-01-12 MED ORDER — MELOXICAM 15 MG PO TABS: 15.0000 mg | ORAL_TABLET | Freq: Every day | ORAL | 0 refills | Status: DC | PRN

## 2019-01-12 MED ORDER — BACLOFEN 10 MG PO TABS: 10.0000 mg | ORAL_TABLET | Freq: Three times a day (TID) | ORAL | 0 refills | Status: DC | PRN

## 2019-01-12 MED ORDER — ETODOLAC 300 MG PO CAPS: 300.0000 mg | ORAL_CAPSULE | Freq: Two times a day (BID) | ORAL | 0 refills | Status: DC

## 2019-01-12 NOTE — Progress Notes (Signed)
Greater than 5 minutes, yet less than 10 minutes of time have been spent researching, coordinating, and implementing care for this patient today.  Thank you for the details you included in the comment boxes. Those details are very helpful in determining the best course of treatment for you and help [place] to provide the best care.  FYI, do not take these meds below with Mobic (you should be out of that by now.)  ------------------------------------------------  We are sorry that you are not feeling well.  Here is how we plan to help!  Based on what you have shared with me it looks like you mostly have acute back pain.  Acute back pain is defined as musculoskeletal pain that can resolve in 1-3 weeks with conservative treatment.  I have prescribed Etodolac 300 mg twice a day non-steroid anti-inflammatory (NSAID) as well as Baclofen 10 mg every eight hours as needed which is a muscle relaxer  Some patients experience stomach irritation or in increased heartburn with anti-inflammatory drugs.  Please keep in mind that muscle relaxer's can cause fatigue and should not be taken while at work or driving.  Back pain is very common.  The pain often gets better over time.  The cause of back pain is usually not dangerous.  Most people can learn to manage their back pain on their own.  Home Care  Stay active.  Start with short walks on flat ground if you can.  Try to walk farther each day.  Do not sit, drive or stand in one place for more than 30 minutes.  Do not stay in bed.  Do not avoid exercise or work.  Activity can help your back heal faster.  Be careful when you bend or lift an object.  Bend at your knees, keep the object close to you, and do not twist.  Sleep on a firm mattress.  Lie on your side, and bend your knees.  If you lie on your back, put a pillow under your knees.  Only take medicines as told by your doctor.  Put ice on the injured area.  Put ice in a plastic bag  Place a towel  between your skin and the bag  Leave the ice on for 15-20 minutes, 3-4 times a day for the first 2-3 days. 210 After that, you can switch between ice and heat packs.  Ask your doctor about back exercises or massage.  Avoid feeling anxious or stressed.  Find good ways to deal with stress, such as exercise.  Get Help Right Way If:  Your pain does not go away with rest or medicine.  Your pain does not go away in 1 week.  You have new problems.  You do not feel well.  The pain spreads into your legs.  You cannot control when you poop (bowel movement) or pee (urinate)  You feel sick to your stomach (nauseous) or throw up (vomit)  You have belly (abdominal) pain.  You feel like you may pass out (faint).  If you develop a fever.  Make Sure you:  Understand these instructions.  Will watch your condition  Will get help right away if you are not doing well or get worse.  Your e-visit answers were reviewed by a board certified advanced clinical practitioner to complete your personal care plan.  Depending on the condition, your plan could have included both over the counter or prescription medications.  If there is a problem please reply  once you have received a  response from your provider.  Your safety is important to us.  If you have drug allergies check your prescription carefully.    You can use MyChart to ask questions about today's visit, request a non-urgent call back, or ask for a work or school excuse for 24 hours related to this e-Visit. If it has been greater than 24 hours you will need to follow up with your provider, or enter a new e-Visit to address those concerns.  You will get an e-mail in the next two days asking about your experience.  I hope that your e-visit has been valuable and will speed your recovery. Thank you for using e-visits.

## 2019-01-12 NOTE — Telephone Encounter (Signed)
Meloxicam Last rx:  11/08/18, #30 Last OV:  06/22/18, acute Next OV:  none

## 2019-02-28 ENCOUNTER — Telehealth: Payer: 59 | Admitting: Family

## 2019-02-28 DIAGNOSIS — M549 Dorsalgia, unspecified: Secondary | ICD-10-CM

## 2019-02-28 NOTE — Progress Notes (Signed)
Based on what you shared with me, I feel your condition warrants further evaluation and I recommend that you be seen for a face to face office visit.  We need to evaluate your back pain further. I see that you have had a couple of e-visits related to back pain. Lets see why it keeps happening.    NOTE: If you entered your credit card information for this eVisit, you will not be charged. You may see a "hold" on your card for the $35 but that hold will drop off and you will not have a charge processed.  If you are having a true medical emergency please call 911.     For an urgent face to face visit, [place] has five urgent care centers for your convenience:    [email or web address] to reserve your spot online an avoid wait times  [facility] [address] [place], [place] [address] Modified hours of operation: Monday-Friday, 12 PM to 6 PM  Closed Saturday & Sunday  *Across the street from [place] (New Address!) [address], [place], [place] [address] *Just off [address], across the road from [place] hours of operation: Monday-Friday, 12 PM to 6 PM  Closed Saturday & Sunday   The following sites will take your insurance:  . [facility]    [phone]                  Get Driving Directions  [address], [place] [address] . 10 am to 8 pm Monday-Friday . 12 pm to 8 pm Saturday-Sunday   . [facility] at [place]                  Get Driving Directions  [address], [place], [place] [address] . 8 am to 8 pm Monday-Friday . 9 am to 6 pm Saturday . 11 am to 6 pm Sunday   . [facility] at [place]                  Get Driving Directions   [address] [place], [place] [address] . 8 am to 8 pm Monday-Friday . 8 am to 4 pm Saturday-Sunday     . [facility] at [place]                    Get Driving Directions  [phone]  [address] [place], [place] [address]  . Monday-Friday, 12 PM to 6 PM    Your e-visit answers were reviewed by a board certified advanced clinical practitioner to complete your personal care plan.  Thank you for using e-Visits.

## 2019-03-08 ENCOUNTER — Telehealth: Payer: 59 | Admitting: Family

## 2019-03-08 ENCOUNTER — Encounter: Payer: Self-pay | Admitting: Family Medicine

## 2019-03-08 DIAGNOSIS — M5441 Lumbago with sciatica, right side: Secondary | ICD-10-CM

## 2019-03-08 NOTE — Progress Notes (Signed)
Based on what you shared with me, I feel your condition warrants further evaluation and I recommend that you be seen for a face to face office visit.  It looks like you completed an Evisit on 02/28/19 and was told to follow up face to face for this back pain. I strongly recommend you follow up with your PCP. You may need a referral to a specialists to discuss options with you.    NOTE: If you entered your credit card information for this eVisit, you will not be charged. You may see a "hold" on your card for the $35 but that hold will drop off and you will not have a charge processed.  If you are having a true medical emergency please call 911.     For an urgent face to face visit, [place] has five urgent care centers for your convenience:    [email or web address] to reserve your spot online an avoid wait times  [facility] [address] [place], [place] [address] Modified hours of operation: Monday-Friday, 12 PM to 6 PM  Closed Saturday & Sunday  *Across the street from [place] (New Address!) [address], [place], [place] [address] *Just off [address], across the road from [place] hours of operation: Monday-Friday, 12 PM to 6 PM  Closed Saturday & Sunday   The following sites will take your insurance:  . [facility]    [phone]                  Get Driving Directions  [address], [place] [address] . 10 am to 8 pm Monday-Friday . 12 pm to 8 pm Saturday-Sunday   . [facility] at [place]                  Get Driving Directions  [address], [place], [place] [address] . 8 am to 8 pm Monday-Friday . 9 am to 6 pm Saturday . 11 am to 6 pm Sunday   . [facility] at [place]                  Get Driving Directions   [address]  [place], [place] [address] . 8 am to 8 pm Monday-Friday . 8 am to 4 pm Saturday-Sunday    . [facility] at [place]                    Get Driving Directions  [phone]  [address] [place],  [address]  . Monday-Friday, 12 PM to 6 PM    Your e-visit answers were reviewed by a board certified advanced clinical practitioner to complete your personal care plan.  Thank you for using e-Visits.

## 2019-03-08 NOTE — Telephone Encounter (Signed)
Plz schedule in office visit.

## 2019-03-10 ENCOUNTER — Other Ambulatory Visit: Payer: Self-pay

## 2019-03-10 ENCOUNTER — Ambulatory Visit (INDEPENDENT_AMBULATORY_CARE_PROVIDER_SITE_OTHER)
Admission: RE | Admit: 2019-03-10 | Discharge: 2019-03-10 | Disposition: A | Discharge status: Home / Self Care | Payer: 59 | Source: Ambulatory Visit | Attending: Family Medicine | Admitting: Family Medicine

## 2019-03-10 ENCOUNTER — Encounter: Payer: Self-pay | Admitting: Family Medicine

## 2019-03-10 ENCOUNTER — Ambulatory Visit: Payer: 59 | Admitting: Family Medicine

## 2019-03-10 VITALS — BP 122/66 | HR 77 | Temp 98.0°F | Ht 66.0 in | Wt 229.1 lb

## 2019-03-10 DIAGNOSIS — E559 Vitamin D deficiency, unspecified: Secondary | ICD-10-CM | POA: Diagnosis not present

## 2019-03-10 DIAGNOSIS — E538 Deficiency of other specified B group vitamins: Secondary | ICD-10-CM | POA: Insufficient documentation

## 2019-03-10 DIAGNOSIS — M545 Low back pain, unspecified: Secondary | ICD-10-CM

## 2019-03-10 DIAGNOSIS — M1711 Unilateral primary osteoarthritis, right knee: Secondary | ICD-10-CM

## 2019-03-10 DIAGNOSIS — G8929 Other chronic pain: Secondary | ICD-10-CM | POA: Diagnosis not present

## 2019-03-10 MED ORDER — PHENTERMINE HCL 30 MG PO CAPS: 30.0000 mg | ORAL_CAPSULE | ORAL | 0 refills | Status: DC

## 2019-03-10 MED ORDER — MELOXICAM 15 MG PO TABS: 15.0000 mg | ORAL_TABLET | Freq: Every day | ORAL | 3 refills | Status: DC | PRN

## 2019-03-10 MED ORDER — METHOCARBAMOL 500 MG PO TABS: 500.0000 mg | ORAL_TABLET | Freq: Three times a day (TID) | ORAL | 3 refills | Status: DC | PRN

## 2019-03-10 NOTE — Assessment & Plan Note (Addendum)
Recurrent acute back pain flares. No red flags. Anticipate related to lumbar degenerative disc disease. Treat with NSAID and muscle relaxant course. Will refer to PT. Encouraged ongoing weight loss efforts.

## 2019-03-10 NOTE — Assessment & Plan Note (Addendum)
Deteriorated - weight gain over last 8 months. Pt attributes to significant stressors - covid pandemic, caregiver for ill husband and sick sister in [place], work stressors. Interested in trial bariatric medication - will prescribe 1 month of phentermine. Reviewed I will ask patient to come in for monthly visits for close f/u and evaluation on medication effectiveness. Reviewed pros/cons of phentermine - and discussed how this is not good long term management strategy, would be short term treatment to help start weight loss process. Consider labwork next visit.

## 2019-03-10 NOTE — Patient Instructions (Addendum)
I think you have wear and tear of lower back leading to pain. Treat with meloxicam anti inflammatory (don't take with ibuprofen) and robaxin muscle relaxant (with sedation precautions).  Use ice/heating pad (whichever soothes better) We will refer you for course of physical therapy. Work on weight loss to help keep back healthy.

## 2019-03-10 NOTE — Progress Notes (Signed)
This visit was conducted in person.  BP 122/66 (BP Location: Left Arm, Patient Position: Sitting, Cuff Size: Large)   Pulse 77   Temp 98 F (36.7 C) (Temporal)   Ht 5\' 6"  (1.676 m)   Wt 229 lb 2 oz (103.9 kg)   LMP 03/10/2019   SpO2 98%   BMI 36.98 kg/m    CC: back pain Subjective:    [name], female    DOB: 08-03-1966, 53 y.o.   MRN: [id number]  HPI: Jasmine OvermanValerie J Behnken is a 53 y.o. female presenting on 03/10/2019 for Back Pain (C/o low back pain, off and on.  Thinks it due to heavy breasts and compensating for arthritis in right knee.   Started a few mos ago.  Tried ibuprofen, helpful. )   Several month h/o mid lower back pain (present since 08/2018) - attributes to large breasts and favoring R leg when she's walking due to R knee pain due to osteoarthritis.   No radiation of pain down legs, numbness/weakness of legs, fever/chills, bowel/bladder incontinence. Denies inciting trauma/injury.   Has treated with muscle relaxants PRN as well as ibuprofen - tries to limit this.   Asks about weight loss medication - struggling with significant weight gain over the past 6 months. She  No personal or fmhx cardiac disease.  No chest pain.      Relevant past medical, surgical, family and social history reviewed and updated as indicated. Interim medical history since our last visit reviewed. Allergies and medications reviewed and updated. Outpatient Medications Prior to Visit  Medication Sig Dispense Refill  . baclofen (LIORESAL) 10 MG tablet Take 1 tablet (10 mg total) by mouth every 8 (eight) hours as needed for muscle spasms. 30 each 0  . Cholecalciferol (VITAMIN D3) 1000 units CAPS Take 1 capsule (1,000 Units total) by mouth daily. 30 capsule   . Cyanocobalamin (B-12) 1000 MCG SUBL Place 1 tablet under the tongue daily. 30 each   . cyclobenzaprine (FLEXERIL) 10 MG tablet Take 1 tablet (10 mg total) by mouth 3 (three) times daily as needed for muscle spasms. 30  tablet 1  . etodolac (LODINE) 300 MG capsule Take 1 capsule (300 mg total) by mouth 2 (two) times daily. 20 capsule 0  . ferrous sulfate 325 (65 FE) MG tablet Take 1 tablet (325 mg total) by mouth every Monday, Wednesday, and Friday.  3  . meloxicam (MOBIC) 15 MG tablet Take 1 tablet (15 mg total) by mouth daily as needed for pain. 30 tablet 0  . naproxen (NAPROSYN) 500 MG tablet Take 1 tablet (500 mg total) by mouth 2 (two) times daily with a meal. 60 tablet 1   No facility-administered medications prior to visit.      Per HPI unless specifically indicated in ROS section below Review of Systems Objective:    BP 122/66 (BP Location: Left Arm, Patient Position: Sitting, Cuff Size: Large)   Pulse 77   Temp 98 F (36.7 C) (Temporal)   Ht 5\' 6"  (1.676 m)   Wt 229 lb 2 oz (103.9 kg)   LMP 03/10/2019   SpO2 98%   BMI 36.98 kg/m   Wt Readings from Last 3 Encounters:  03/10/19 229 lb 2 oz (103.9 kg)  06/22/18 219 lb 4 oz (99.5 kg)  05/11/18 223 lb (101.2 kg)    Physical Exam Vitals signs and nursing note reviewed.  Constitutional:      General: She is not in acute distress.  Appearance: Normal appearance. She is not ill-appearing.  Musculoskeletal: Normal range of motion.     Comments: Mild discomfort midline mid lumbar spine + paraspinous mm tenderness/tightness Neg SLR bilaterally. No pain with int/ext rotation at hip. Neg FABER. No pain at SIJ, GTB or sciatic notch bilaterally.   Neurological:     General: No focal deficit present.     Mental Status: She is alert.     Sensory: Sensation is intact.     Motor: Motor function is intact.     Coordination: Coordination is intact.     Deep Tendon Reflexes:     Reflex Scores:      Patellar reflexes are 1+ on the right side and 1+ on the left side.    Comments: 5/5 strength BLE. Sensation intact.         Assessment & Plan:  Not currently taking supplements (vit D, iron, vit B12). Encouraged restart all.  Problem List  Items Addressed This Visit    Vitamin D deficiency   Severe obesity (BMI 35.0-39.9) with comorbidity (HCC)    Deteriorated - weight gain over last 8 months. Pt attributes to significant stressors - covid pandemic, caregiver for ill husband and sick sister in [place], work stressors. Interested in trial bariatric medication - will prescribe 1 month of phentermine. Reviewed I will ask patient to come in for monthly visits for close f/u and evaluation on medication effectiveness. Reviewed pros/cons of phentermine - and discussed how this is not good long term management strategy, would be short term treatment to help start weight loss process. Consider labwork next visit.       Relevant Medications   phentermine 30 MG capsule   Primary osteoarthritis of right knee   Relevant Medications   meloxicam (MOBIC) 15 MG tablet   methocarbamol (ROBAXIN) 500 MG tablet   Low vitamin B12 level   Acute low back pain without sciatica - Primary    Recurrent acute back pain flares. No red flags. Anticipate related to lumbar degenerative disc disease. Treat with NSAID and muscle relaxant course. Will refer to PT. Encouraged ongoing weight loss efforts.       Relevant Medications   meloxicam (MOBIC) 15 MG tablet   methocarbamol (ROBAXIN) 500 MG tablet   Other Relevant Orders   DG Lumbar Spine Complete   Ambulatory referral to Physical Therapy       Meds ordered this encounter  Medications  . meloxicam (MOBIC) 15 MG tablet    Sig: Take 1 tablet (15 mg total) by mouth daily as needed for pain.    Dispense:  30 tablet    Refill:  3  . methocarbamol (ROBAXIN) 500 MG tablet    Sig: Take 1-2 tablets (500-1,000 mg total) by mouth 3 (three) times daily as needed for muscle spasms (sedation precuations).    Dispense:  40 tablet    Refill:  3  . phentermine 30 MG capsule    Sig: Take 1 capsule (30 mg total) by mouth every morning.    Dispense:  30 capsule    Refill:  0   Orders Placed This Encounter   Procedures  . DG Lumbar Spine Complete    Standing Status:   Future    Number of Occurrences:   1    Standing Expiration Date:   05/10/2020    Order Specific Question:   Reason for Exam (SYMPTOM  OR DIAGNOSIS REQUIRED)    Answer:   lumbar back pain for years  Order Specific Question:   Is patient pregnant?    Answer:   No    Order Specific Question:   Preferred imaging location?    Answer:   [facility]    Order Specific Question:   Radiology Contrast Protocol - do NOT remove file path    Answer:   [email or web address]  . Ambulatory referral to Physical Therapy    Referral Priority:   Routine    Referral Type:   Physical Medicine    Referral Reason:   Specialty Services Required    Requested Specialty:   Physical Therapy    Number of Visits Requested:   1    Follow up plan: Return in about 4 weeks (around 04/07/2019), or if symptoms worsen or fail to improve, for follow up visit.  Ria Bush, MD

## 2019-03-10 NOTE — Assessment & Plan Note (Deleted)
Marked deterioration - weight gain (80 lbs) noted over last 8 months. Pt attributes to to significant stressors - covid pandemic, caregiver for ill husband and sick sister in [place], work stressors. Interested in trial bariatric medication - will prescribe 1 month of phentermine. Reviewed I will ask patient to come in for monthly visits for close f/u and evaluation on medication effectiveness. Reviewed pros/cons of phentermine - and discussed how this is not good long term management strategy, would be short term treatment to help start weight loss process. Consider labwork next visit.

## 2019-03-13 DIAGNOSIS — H5213 Myopia, bilateral: Secondary | ICD-10-CM | POA: Diagnosis not present

## 2019-03-17 DIAGNOSIS — Z01419 Encounter for gynecological examination (general) (routine) without abnormal findings: Secondary | ICD-10-CM | POA: Diagnosis not present

## 2019-03-17 DIAGNOSIS — Z1231 Encounter for screening mammogram for malignant neoplasm of breast: Secondary | ICD-10-CM | POA: Diagnosis not present

## 2019-03-17 LAB — RESULTS CONSOLE HPV: CHL HPV: NEGATIVE

## 2019-03-17 LAB — HM PAP SMEAR: HM Pap smear: NEGATIVE

## 2019-04-03 ENCOUNTER — Ambulatory Visit: Payer: 59 | Attending: Family Medicine

## 2019-04-03 ENCOUNTER — Other Ambulatory Visit: Payer: Self-pay

## 2019-04-03 DIAGNOSIS — M545 Low back pain, unspecified: Secondary | ICD-10-CM

## 2019-04-03 DIAGNOSIS — M25561 Pain in right knee: Secondary | ICD-10-CM | POA: Insufficient documentation

## 2019-04-03 DIAGNOSIS — G8929 Other chronic pain: Secondary | ICD-10-CM | POA: Diagnosis not present

## 2019-04-03 NOTE — Patient Instructions (Addendum)
       Seated hip extension isometrics   Sitting on a chair,    Squeeze your rear end muscles together and press your right foot onto the floor.     Hold for 5 seconds    Repeat 10 times   Perform 3 sets daily.      This is a corrective exercise. Once you no longer have symptoms, you can stop.       Medbridge Access Code: [email or web address]  Seated transversus abdominis contraction 10x3 with 5 seconds every hour.        Gave standing L lateral shift correction 10x3 with 5 second holds. Pt demonstrated and verbalized understanding. Handout provided.

## 2019-04-03 NOTE — Therapy (Signed)
[facility] [address] [place], [place], [address] Phone: [phone]   Fax:  [phone]  Physical Therapy Evaluation  Patient Details  Name: Jasmine Willis MRN: [id number] Date of Birth: October 01, 1965 Referring Provider (PT): Eustaquio BoydenJavier Gutierrez, MD   Encounter Date: 04/03/2019  PT End of Session - 04/03/19 1620    Visit Number  1    Number of Visits  13    Date for PT Re-Evaluation  05/18/19    PT Start Time  1620    PT Stop Time  1726    PT Time Calculation (min)  66 min    Activity Tolerance  Patient tolerated treatment well    Behavior During Therapy  [facility] for tasks assessed/performed       Past Medical History:  Diagnosis Date  . Anxiety     Past Surgical History:  Procedure Laterality Date  . NO PAST SURGERIES      There were no vitals filed for this visit.   Subjective Assessment - 04/03/19 1624    Subjective  Central low back pain. 0/10  currently (pt sitting), 3/10 at most for the past 3 months.; R knee 0/10 currently, 7/10 at most for the past 3 months    Pertinent History  Low back pain. Pt states pain comes and goes. Pt states that she feels great right now. Pain began last year. Pt has R knee arthritis as well as heavy trunk area (pt trying to get breast reduction) which affects the mechanics of her back when walking (antalgic gait).  Pt currently works at [employer] at the [employer]. 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  Denies LE paresthesia or loss of bowel or bladder control. Pt stood up one time at work and could not stand fully up. Had to waddle to walk.  Pain has improved since onset.    Patient Stated Goals  Improve core strength.    Currently in Pain?  No/denies    Pain Score  0-No pain    Pain Descriptors / Indicators  Pressure    Pain Type  Chronic pain    Pain Onset  More than a month ago    Pain Frequency  Occasional     Aggravating Factors   Back: Prolonged sitting (pt leans forward a lot); R knee: walking 2 miles, walking up a hill.    Pain Relieving Factors  Back: Stretches: supine cross crunch (elbow to contralateral knee), supine B shoulder flexion, muscle relaxers; R knee: wearing arch supports, sitting.         [facility] - 04/03/19 1623      Assessment   Medical Diagnosis  Acute midline low back pain without sciatica    Referring Provider (PT)  Eustaquio BoydenJavier Gutierrez, MD    Onset Date/Surgical Date  03/10/19   Date PT referral signed   Prior Therapy  No known PT for current condition      Precautions   Precaution Comments  no known precautions      Restrictions   Other Position/Activity Restrictions  no known restrictions      Balance Screen   Has the patient fallen in the past 6 months  No    Has the patient had a decrease in activity level because of a fear of falling?   No    Is the patient reluctant to leave their home because of  a fear of falling?   No      Prior Function   Vocation  Full time employment    Vocation Requirements  PLOF: better able to tolerate prolonged sitting, pick up items from lower surfaces, ambulate longer distances with minimal to no low back or R knee pain      Observation/Other Assessments   Observations  (-) Repeated flexion test, (+) Slump L side; Long sit test suggests anterior nutation of L innominate.       Posture/Postural Control   Posture Comments  Decreased lumbar lordosis, slight L lateral shift, R foot pronation > L.  R convextity around L2/3 area.       AROM   Lumbar Flexion  WFL     Lumbar Extension  WFL (movement preferance L L4/L5 area)    Lumbar - Right Side Bend  WFL    Lumbar - Left Side Bend  WFL    Lumbar - Right Rotation  [facility]    Lumbar - Left Rotation  [facility]      Strength   Right Hip Flexion  4-/5    Right Hip Extension  4-/5    Right Hip ABduction  5/5    Left Hip Flexion  4-/5    Left Hip Extension  4/5    Left Hip  ABduction  5/5    Right Knee Flexion  5/5    Right Knee Extension  5/5    Left Knee Flexion  5/5    Left Knee Extension  5/5      Palpation   Palpation comment  decreased medial glide R patella. Good low back mobility with R and L UPA and central PA L1-5. Slight decreased mobility mid thoracic spine with central P to A.       Ambulation/Gait   Gait Comments  Pelvic drop during stance phase bilaterally                 Objective measurements completed on examination: See above findings.   No latex band allergies       Observation: bending over increases back pain  Seated B ankle DF: reproduces low back pain  No pain with activation of transversus abdominis muscles  Forward flexion: back pain. Disappears with tranversus abdominis contraction.    Medbridge Access Code: [facility]  Therapeutic exercise  L lateral shift correction standing 10x5 seconds for 2 sets  Seated transversus abdominis contraction. 10x5 seconds for 2 sets  Seated L hip extension isometrics 10x5 seconds   Seated R hip extension isometrics 10x2 with 5 second holds. Improved low back posture observed during exercise   Improved exercise technique, movement at target joints, use of target muscles after mod verbal, visual, tactile cues.      Patient is a 53 year old female who came to physical therapy secondary to low back pain. She also demonstrates R knee pain, altered posture, bilateral glute max weakness, decreased trunk strength and lumbopelvic control, decreased medial glide R patella, and difficulty performing tasks such as walking long distances as well as difficulty with prolonged sitting for work due to back and R knee pain. Pt will benefit from skilled physical therapy services to address the aforementioned deficits.        PT Education - 04/03/19 1908    Education Details  ther-ex, HEP, plan of care    Person(s) Educated  Patient    Methods  Explanation;Demonstration;Tactile  cues;Verbal cues;Handout    Comprehension  Verbalized understanding;Returned demonstration  PT Short Term Goals - 04/03/19 1736      PT SHORT TERM GOAL #1   Title  Patient will be independent with her HEP to improve strength, decrease pain, improve function.    Baseline  Patient has started her HEP (04/03/2019)    Time  3    Period  Weeks    Status  New    Target Date  04/27/19        PT Long Term Goals - 04/03/19 1735      PT LONG TERM GOAL #1   Title  Patient will have a decrease in back pain to 1/10 or less at worst to promote ability to tolerate prolonged sitting, or bending over to pick items up from lower surfaces.    Baseline  3/10 back pain at worst for the past 3 months (04/03/2019)    Time  6    Period  Weeks    Status  New    Target Date  05/18/19      PT LONG TERM GOAL #2   Title  Patient will have a decrease in R knee pain to 3/10 or less at worst to promote ability to ambulate longer distances, as well as with minimal to no compensation affecting her low back.    Baseline  7/10 R knee pain at worst for the past 3 months (04/03/2019)    Time  6    Period  Weeks    Status  New    Target Date  05/18/19      PT LONG TERM GOAL #3   Title  Patient will improve bilatearl glute max strength by at least 1/2 MMT grade to promote ability to perform tasks such as picking up items from lower surfaces with minimal to no pain.    Baseline  Glute max: 4-/5 R, 4/5 L (04/03/2019)    Time  6    Period  Weeks    Status  New    Target Date  05/18/19             Plan - 04/03/19 1858    Clinical Impression Statement  Patient is a 53 year old female who came to physical therapy secondary to low back pain. She also demonstrates R knee pain, altered posture, bilateral glute max weakness, decreased trunk strength and lumbopelvic control, decreased medial glide R patella, and difficulty performing tasks such as walking long distances as well as difficulty with prolonged  sitting for work due to back and R knee pain. Pt will benefit from skilled physical therapy services to address the aforementioned deficits.    Personal Factors and Comorbidities  Time since onset of injury/illness/exacerbation;Fitness    Examination-Activity Limitations  Sit;Bend;Lift;Carry    Stability/Clinical Decision Making  Stable/Uncomplicated    Clinical Decision Making  Low    Rehab Potential  Good    PT Frequency  2x / week    PT Duration  6 weeks    PT Treatment/Interventions  Therapeutic exercise;Neuromuscular re-education;Therapeutic activities;Patient/family education;Manual techniques;Dry needling;Spinal Manipulations;Joint Manipulations;Aquatic Therapy;Electrical Stimulation;Iontophoresis 4mg /ml Dexamethasone;Traction   traction, manipulations if appropriate   PT Next Visit Plan  trunk and glute strengthening, lumbopelvic control, manual techniques, modalities PRN    PT Home Exercise Plan  Medbridge Access Code: [email or web address]    Consulted and Agree with Plan of Care  Patient       Patient will benefit from skilled therapeutic intervention in order to improve the following deficits and impairments:  Pain, Postural dysfunction, Improper body  mechanics, Decreased strength  Visit Diagnosis: 1. Chronic low back pain, unspecified back pain laterality, unspecified whether sciatica present   2. Chronic pain of right knee        Problem List Patient Active Problem List   Diagnosis Date Noted  . Acute low back pain without sciatica 03/10/2019  . Low vitamin B12 level 03/10/2019  . Vitamin D deficiency 03/10/2019  . Fatigue 06/23/2018  . Primary osteoarthritis of right knee 06/23/2018  . IDA (iron deficiency anemia) 06/23/2018  . Health maintenance examination 08/09/2015  . Severe obesity (BMI 35.0-39.9) with comorbidity (HCC) 08/09/2015  . Anxiety     Loralyn FreshwaterMiguel Jakita Willis PT, DPT   04/03/2019, 7:12 PM  [facility]  MEDICINE [address] [place], [place], [address] Phone: [phone]   Fax:  [phone]  Name: Jasmine OvermanValerie J Willis MRN: [id number] Date of Birth: October 08, 1965

## 2019-04-05 ENCOUNTER — Ambulatory Visit: Payer: 59

## 2019-04-05 ENCOUNTER — Telehealth: Payer: Self-pay

## 2019-04-05 NOTE — Telephone Encounter (Signed)
No show. Tried calling pt mobile phone number and leave a message but voice mailbox has not been set up yet.

## 2019-04-10 ENCOUNTER — Ambulatory Visit: Payer: 59

## 2019-04-11 ENCOUNTER — Other Ambulatory Visit: Payer: Self-pay | Admitting: Family Medicine

## 2019-04-11 NOTE — Telephone Encounter (Signed)
Name of Medication: Phentermine Name of Pharmacy: [facility] Date and Quantity: 03/10/19, #30 Last Office Visit and Type: 03/10/19, acute Next Office Visit and Type: 06/13/19, CPE Last Controlled Substance Agreement Date: none Last UDS: none

## 2019-04-12 ENCOUNTER — Ambulatory Visit: Payer: 59

## 2019-04-12 NOTE — Telephone Encounter (Signed)
Asked to schedule OV>

## 2019-04-12 NOTE — Telephone Encounter (Signed)
Patients receiving weight loss treatment should be seen every 4-6 wks for close monitoring. plz call and schedule then I will refill. I reviewed this with pt at office visit last month. I have not refilled phentermine.

## 2019-04-12 NOTE — Telephone Encounter (Signed)
Last office visit 03/10/2019 for back pain.  Last refilled 03/10/2019 for #30 with no refills.  CPE scheduled for 06/13/2019.

## 2019-04-14 NOTE — Telephone Encounter (Signed)
Attempted to contact pt.  No answer.  Vm box not set up.  Need to schedule wt loss med f/u.

## 2019-04-17 ENCOUNTER — Ambulatory Visit (INDEPENDENT_AMBULATORY_CARE_PROVIDER_SITE_OTHER): Payer: 59 | Admitting: Family Medicine

## 2019-04-17 ENCOUNTER — Other Ambulatory Visit: Payer: Self-pay

## 2019-04-17 ENCOUNTER — Encounter: Payer: Self-pay | Admitting: Family Medicine

## 2019-04-17 ENCOUNTER — Ambulatory Visit: Payer: 59

## 2019-04-17 VITALS — BP 132/80 | HR 77 | Temp 98.0°F | Ht 66.25 in | Wt 216.6 lb

## 2019-04-17 DIAGNOSIS — D509 Iron deficiency anemia, unspecified: Secondary | ICD-10-CM | POA: Diagnosis not present

## 2019-04-17 DIAGNOSIS — M545 Low back pain, unspecified: Secondary | ICD-10-CM

## 2019-04-17 DIAGNOSIS — G8929 Other chronic pain: Secondary | ICD-10-CM | POA: Diagnosis not present

## 2019-04-17 DIAGNOSIS — E538 Deficiency of other specified B group vitamins: Secondary | ICD-10-CM | POA: Diagnosis not present

## 2019-04-17 DIAGNOSIS — E559 Vitamin D deficiency, unspecified: Secondary | ICD-10-CM | POA: Diagnosis not present

## 2019-04-17 DIAGNOSIS — M25561 Pain in right knee: Secondary | ICD-10-CM | POA: Diagnosis not present

## 2019-04-17 DIAGNOSIS — E669 Obesity, unspecified: Secondary | ICD-10-CM | POA: Diagnosis not present

## 2019-04-17 DIAGNOSIS — Z1211 Encounter for screening for malignant neoplasm of colon: Secondary | ICD-10-CM | POA: Diagnosis not present

## 2019-04-17 DIAGNOSIS — Z0000 Encounter for general adult medical examination without abnormal findings: Secondary | ICD-10-CM | POA: Diagnosis not present

## 2019-04-17 LAB — IBC PANEL
Iron: 52 ug/dL (ref 42–145)
Saturation Ratios: 13.1 % — ABNORMAL LOW (ref 20.0–50.0)
Transferrin: 283 mg/dL (ref 212.0–360.0)

## 2019-04-17 LAB — BASIC METABOLIC PANEL
BUN: 11 mg/dL (ref 6–23)
CO2: 24 mEq/L (ref 19–32)
Calcium: 9.2 mg/dL (ref 8.4–10.5)
Chloride: 105 mEq/L (ref 96–112)
Creatinine, Ser: 0.83 mg/dL (ref 0.40–1.20)
GFR: 72.4 mL/min (ref >60.00)
Glucose, Bld: 95 mg/dL (ref 70–99)
Potassium: 3.9 mEq/L (ref 3.5–5.1)
Sodium: 137 mEq/L (ref 135–145)

## 2019-04-17 LAB — CBC WITH DIFFERENTIAL/PLATELET
Basophils Absolute: 0.1 10*3/uL (ref 0.0–0.1)
Basophils Relative: 0.8 % (ref 0.0–3.0)
Eosinophils Absolute: 0.2 10*3/uL (ref 0.0–0.7)
Eosinophils Relative: 2.3 % (ref 0.0–5.0)
HCT: 38.6 % (ref 36.0–46.0)
Hemoglobin: 12.9 g/dL (ref 12.0–15.0)
Lymphocytes Relative: 20.3 % (ref 12.0–46.0)
Lymphs Abs: 2.2 10*3/uL (ref 0.7–4.0)
MCHC: 33.5 g/dL (ref 30.0–36.0)
MCV: 84.5 fl (ref 78.0–100.0)
Monocytes Absolute: 0.5 10*3/uL (ref 0.1–1.0)
Monocytes Relative: 4.6 % (ref 3.0–12.0)
Neutro Abs: 7.8 10*3/uL — ABNORMAL HIGH (ref 1.4–7.7)
Neutrophils Relative %: 72 % (ref 43.0–77.0)
Platelets: 343 10*3/uL (ref 150.0–400.0)
RBC: 4.56 Mil/uL (ref 3.87–5.11)
RDW: 13.7 % (ref 11.5–15.5)
WBC: 10.8 10*3/uL — ABNORMAL HIGH (ref 4.0–10.5)

## 2019-04-17 MED ORDER — PHENTERMINE HCL 30 MG PO CAPS: 30.0000 mg | ORAL_CAPSULE | ORAL | 0 refills | Status: DC

## 2019-04-17 NOTE — Assessment & Plan Note (Signed)
Has started PT with benefit.

## 2019-04-17 NOTE — Assessment & Plan Note (Signed)
Preventative protocols reviewed and updated unless pt declined. Discussed healthy diet and lifestyle.  

## 2019-04-17 NOTE — Patient Instructions (Addendum)
Pass by lab to pick up stool kit. labwork today Check with employee health on latest tetanus shot  Continue phentermine, good job with weight! Return in 4-6 weeks for follow up visit.   Health Maintenance, Female Adopting a healthy lifestyle and getting preventive care are important in promoting health and wellness. Ask your health care provider about:  The right schedule for you to have regular tests and exams.  Things you can do on your own to prevent diseases and keep yourself healthy. What should I know about diet, weight, and exercise? Eat a healthy diet   Eat a diet that includes plenty of vegetables, fruits, low-fat dairy products, and lean protein.  Do not eat a lot of foods that are high in solid fats, added sugars, or sodium. Maintain a healthy weight Body mass index (BMI) is used to identify weight problems. It estimates body fat based on height and weight. Your health care provider can help determine your BMI and help you achieve or maintain a healthy weight. Get regular exercise Get regular exercise. This is one of the most important things you can do for your health. Most adults should:  Exercise for at least 150 minutes each week. The exercise should increase your heart rate and make you sweat (moderate-intensity exercise).  Do strengthening exercises at least twice a week. This is in addition to the moderate-intensity exercise.  Spend less time sitting. Even light physical activity can be beneficial. Watch cholesterol and blood lipids Have your blood tested for lipids and cholesterol at 51 years of age, then have this test every 5 years. Have your cholesterol levels checked more often if:  Your lipid or cholesterol levels are high.  You are older than 51 years of age.  You are at high risk for heart disease. What should I know about cancer screening? Depending on your health history and family history, you may need to have cancer screening at various ages. This  may include screening for:  Breast cancer.  Cervical cancer.  Colorectal cancer.  Skin cancer.  Lung cancer. What should I know about heart disease, diabetes, and high blood pressure? Blood pressure and heart disease  High blood pressure causes heart disease and increases the risk of stroke. This is more likely to develop in people who have high blood pressure readings, are of African descent, or are overweight.  Have your blood pressure checked: ? Every 3-5 years if you are 92-31 years of age. ? Every year if you are 102 years old or older. Diabetes Have regular diabetes screenings. This checks your fasting blood sugar level. Have the screening done:  Once every three years after age 54 if you are at a normal weight and have a low risk for diabetes.  More often and at a younger age if you are overweight or have a high risk for diabetes. What should I know about preventing infection? Hepatitis B If you have a higher risk for hepatitis B, you should be screened for this virus. Talk with your health care provider to find out if you are at risk for hepatitis B infection. Hepatitis C Testing is recommended for:  Everyone born from [birth year] through 1963.  Anyone with known risk factors for hepatitis C. Sexually transmitted infections (STIs)  Get screened for STIs, including gonorrhea and chlamydia, if: ? You are sexually active and are younger than 51 years of age. ? You are older than 52 years of age and your health care provider tells you that you  are at risk for this type of infection. ? Your sexual activity has changed since you were last screened, and you are at increased risk for chlamydia or gonorrhea. Ask your health care provider if you are at risk.  Ask your health care provider about whether you are at high risk for HIV. Your health care provider may recommend a prescription medicine to help prevent HIV infection. If you choose to take medicine to prevent HIV, you should  first get tested for HIV. You should then be tested every 3 months for as long as you are taking the medicine. Pregnancy  If you are about to stop having your period (premenopausal) and you may become pregnant, seek counseling before you get pregnant.  Take 400 to 800 micrograms (mcg) of folic acid every day if you become pregnant.  Ask for birth control (contraception) if you want to prevent pregnancy. Osteoporosis and menopause Osteoporosis is a disease in which the bones lose minerals and strength with aging. This can result in bone fractures. If you are 22 years old or older, or if you are at risk for osteoporosis and fractures, ask your health care provider if you should:  Be screened for bone loss.  Take a calcium or vitamin D supplement to lower your risk of fractures.  Be given hormone replacement therapy (HRT) to treat symptoms of menopause. Follow these instructions at home: Lifestyle  Do not use any products that contain nicotine or tobacco, such as cigarettes, e-cigarettes, and chewing tobacco. If you need help quitting, ask your health care provider.  Do not use street drugs.  Do not share needles.  Ask your health care provider for help if you need support or information about quitting drugs. Alcohol use  Do not drink alcohol if: ? Your health care provider tells you not to drink. ? You are pregnant, may be pregnant, or are planning to become pregnant.  If you drink alcohol: ? Limit how much you use to 0-1 drink a day. ? Limit intake if you are breastfeeding.  Be aware of how much alcohol is in your drink. In the [place] one drink equals one 12 oz bottle of beer (355 mL), one 5 oz glass of wine (148 mL), or one 1 oz glass of hard liquor (44 mL). General instructions  Schedule regular health, dental, and eye exams.  Stay current with your vaccines.  Tell your health care provider if: ? You often feel depressed. ? You have ever been abused or do not feel safe  at home. Summary  Adopting a healthy lifestyle and getting preventive care are important in promoting health and wellness.  Follow your health care provider's instructions about healthy diet, exercising, and getting tested or screened for diseases.  Follow your health care provider's instructions on monitoring your cholesterol and blood pressure. This information is not intended to replace advice given to you by your health care provider. Make sure you discuss any questions you have with your health care provider. Document Released: 02/23/2011 Document Revised: 08/03/2018 Document Reviewed: 08/03/2018 Elsevier Patient Education  2020 [employer].

## 2019-04-17 NOTE — Assessment & Plan Note (Signed)
Congratulated on weight loss! 13 lbs in the past month. Continue phentermine which she is tolerating well. rec RTC 1 mo f/u visit.

## 2019-04-17 NOTE — Assessment & Plan Note (Signed)
Update labs.  

## 2019-04-17 NOTE — Therapy (Signed)
[facility] [address] [place], [place], [address] Phone: [phone]   Fax:  [phone]  Physical Therapy Treatment  Patient Details  Name: Jasmine OvermanValerie J Gutierres MRN: [id number] Date of Birth: 01/01/1966 Referring Provider (PT): Eustaquio BoydenJavier Gutierrez, MD   Encounter Date: 04/17/2019  PT End of Session - 04/17/19 1623    Visit Number  2    Number of Visits  13    Date for PT Re-Evaluation  05/18/19    PT Start Time  1615    PT Stop Time  1655    PT Time Calculation (min)  40 min    Activity Tolerance  Patient tolerated treatment well    Behavior During Therapy  [facility] for tasks assessed/performed       Past Medical History:  Diagnosis Date  . Anxiety     Past Surgical History:  Procedure Laterality Date  . NO PAST SURGERIES      There were no vitals filed for this visit.  Subjective Assessment - 04/17/19 1619    Subjective  Patient stated that her back is doing well, except for the same lower back pain. she thinks it has to do with her leg arthritis and would benefit from a breast reduction.    Pertinent History  Low back pain. Pt states pain comes and goes. Pt states that she feels great right now. Pain began last year. Pt has R knee arthritis as well as heavy trunk area (pt trying to get breast reduction) which affects the mechanics of her back when walking (antalgic gait).  Pt currently works at [employer] at the [employer]. 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  Denies LE paresthesia or loss of bowel or bladder control. Pt stood up one time at work and could not stand fully up. Had to waddle to walk.  Pain has improved since onset.    Patient Stated Goals  Improve core strength.    Currently in Pain?  Yes    Pain Score  1     Pain Location  Back    Pain Orientation  Mid;Lower    Pain Descriptors / Indicators  Pressure    Pain Type  Chronic pain    Pain Onset  More than a month ago      Treatment:   Medbridge Access Code: [email or web address]   Therapeutic exercise   L lateral shift correction standing 10x5 seconds for 2 sets   Seated transversus abdominis contraction. 10x5 seconds for 2 sets    Seated R hip extension isometrics 10x2 with 5 second holds. Improved low back posture observed during exercise   Prone pressup x10 verbal/visual cues  Thoracic sidelying x10 bilateraly verbal/visual cues  Supine piriformis stretch 2x30secs  Clamshells bilaterally 2x15   Improved exercise technique, movement at target joints, use of target muscles after mod verbal, visual, tactile cues.   **patient would benefit from continued strengthening program next session**   Pt response/clinical impression: Patient stated she is having difficulty attending PT due to limited availability. PT and pt discussed plans moving forward, the patient would benefit from another two weeks of therapy, pt amendable with encouragement. HEP updated to address pt compliance, and pt will be requesting alternative appointments to better fit her schedule. The patient would benefit from further PT intervention to continue to progress HEP and maximize functional abilities.       PT Education -  04/17/19 1622    Education Details  ther-ex, HEP, POC    Person(s) Educated  Patient    Methods  Explanation;Demonstration;Tactile cues;Verbal cues    Comprehension  Verbalized understanding;Returned demonstration       PT Short Term Goals - 04/03/19 1736      PT SHORT TERM GOAL #1   Title  Patient will be independent with her HEP to improve strength, decrease pain, improve function.    Baseline  Patient has started her HEP (04/03/2019)    Time  3    Period  Weeks    Status  New    Target Date  04/27/19        PT Long Term Goals - 04/03/19 1735      PT LONG TERM GOAL #1   Title  Patient will have a decrease in back pain to 1/10 or less at worst to promote ability to  tolerate prolonged sitting, or bending over to pick items up from lower surfaces.    Baseline  3/10 back pain at worst for the past 3 months (04/03/2019)    Time  6    Period  Weeks    Status  New    Target Date  05/18/19      PT LONG TERM GOAL #2   Title  Patient will have a decrease in R knee pain to 3/10 or less at worst to promote ability to ambulate longer distances, as well as with minimal to no compensation affecting her low back.    Baseline  7/10 R knee pain at worst for the past 3 months (04/03/2019)    Time  6    Period  Weeks    Status  New    Target Date  05/18/19      PT LONG TERM GOAL #3   Title  Patient will improve bilatearl glute max strength by at least 1/2 MMT grade to promote ability to perform tasks such as picking up items from lower surfaces with minimal to no pain.    Baseline  Glute max: 4-/5 R, 4/5 L (04/03/2019)    Time  6    Period  Weeks    Status  New    Target Date  05/18/19            Plan - 04/17/19 1630    Clinical Impression Statement  Patient stated she is having difficulty attending PT due to limited availability. PT and pt discussed plans moving forward, the patient would benefit from another two weeks of therapy, pt amendable with encouragement. HEP updated to address pt compliance, and pt will be requesting alternative appointments to better fit her schedule. The patient would benefit from further PT intervention to continue to progress HEP and maximize functional abilities.    Personal Factors and Comorbidities  Time since onset of injury/illness/exacerbation;Fitness    Examination-Activity Limitations  Sit;Bend;Lift;Carry    Stability/Clinical Decision Making  Stable/Uncomplicated    Rehab Potential  Good    PT Frequency  2x / week    PT Duration  6 weeks    PT Treatment/Interventions  Therapeutic exercise;Neuromuscular re-education;Therapeutic activities;Patient/family education;Manual techniques;Dry needling;Spinal Manipulations;Joint  Manipulations;Aquatic Therapy;Electrical Stimulation;Iontophoresis 4mg /ml Dexamethasone;Traction   traction/manipulations as appropriate   PT Next Visit Plan  trunk and glute strengthening, lumbopelvic control, manual techniques, modalities PRN    PT Home Exercise Plan  Medbridge Access Code: [email or web address]    Consulted and Agree with Plan of Care  Patient       Patient  will benefit from skilled therapeutic intervention in order to improve the following deficits and impairments:  Pain, Postural dysfunction, Improper body mechanics, Decreased strength  Visit Diagnosis: Chronic low back pain, unspecified back pain laterality, unspecified whether sciatica present  Chronic pain of right knee     Problem List Patient Active Problem List   Diagnosis Date Noted  . Acute low back pain without sciatica 03/10/2019  . Low vitamin B12 level 03/10/2019  . Vitamin D deficiency 03/10/2019  . Fatigue 06/23/2018  . Primary osteoarthritis of right knee 06/23/2018  . IDA (iron deficiency anemia) 06/23/2018  . Health maintenance examination 08/09/2015  . Obesity, Class I, BMI 30-34.9 08/09/2015  . Anxiety     Olga CoasterDiana Rio Taber PT, DPT 4:53 PM,04/17/19 [phone]  [facility] [address] [place], [place], [address] Phone: [phone]   Fax:  [phone]  Name: Jasmine OvermanValerie J Henigan MRN: [id number] Date of Birth: [birth year]

## 2019-04-17 NOTE — Progress Notes (Signed)
This visit was conducted in person.  BP 132/80 (BP Location: Right Arm, Patient Position: Sitting, Cuff Size: Large)   Pulse 77   Temp 98 F (36.7 C) (Temporal)   Ht 5' 6.25" (1.683 m)   Wt 216 lb 9 oz (98.2 kg)   LMP 04/03/2019   SpO2 98%   BMI 34.69 kg/m    CC: CPE Subjective:    [name], female    DOB: Mar 20, 1966, 53 y.o.   MRN: [id number]  HPI: Jasmine Willis is a 53 y.o. female presenting on 04/17/2019 for Annual Exam   Back pain - referred to PT - has not started yet.  Obesity - last visit we started phentermine 48m daily - continues this with benefit. No HA, chest pain or insomnia.   Starting weight 229lbs  Today's weight 216lbs   Preventative: Well woman with KRayne Duseen last month with pap smear.  Colon cancer screening - discussed, would like stool kit. Flu shot at work Tetanus - unsure - check with employee health on latest tetanus shot  Seat belt use discussed Sunscreen use discussed, no changing moles on skin Non smoker Alcohol - occasional glass of wine Dentist q6 mo Eye exam yearly  Lives with husband and 2 children, 1 dog Edu: some college Occupation: works at [employer]: walking 2 miles 3x/wk, lots of walking at work Diet: good water, fruits/vegetables daily     Relevant past medical, surgical, family and social history reviewed and updated as indicated. Interim medical history since our last visit reviewed. Allergies and medications reviewed and updated. Outpatient Medications Prior to Visit  Medication Sig Dispense Refill  . meloxicam (MOBIC) 15 MG tablet Take 1 tablet (15 mg total) by mouth daily as needed for pain. 30 tablet 3  . methocarbamol (ROBAXIN) 500 MG tablet Take 1-2 tablets (500-1,000 mg total) by mouth 3 (three) times daily as needed for muscle spasms (sedation precuations). 40 tablet 3  . phentermine 30 MG capsule Take 1 capsule (30 mg total) by mouth every morning. 30 capsule 0    No facility-administered medications prior to visit.      Per HPI unless specifically indicated in ROS section below Review of Systems  Constitutional: Positive for appetite change (on phentermie). Negative for activity change, chills, fatigue, fever and unexpected weight change.  HENT: Negative for hearing loss.   Eyes: Negative for visual disturbance.  Respiratory: Negative for cough, chest tightness, shortness of breath and wheezing.   Cardiovascular: Negative for chest pain, palpitations and leg swelling.  Gastrointestinal: Negative for abdominal distention, abdominal pain, blood in stool, constipation, diarrhea, nausea and vomiting.  Genitourinary: Negative for difficulty urinating and hematuria.  Musculoskeletal: Negative for arthralgias, myalgias and neck pain.  Skin: Negative for rash.  Neurological: Negative for dizziness, seizures, syncope and headaches.  Hematological: Negative for adenopathy. Does not bruise/bleed easily.  Psychiatric/Behavioral: Negative for dysphoric mood. The patient is not nervous/anxious.    Objective:    BP 132/80 (BP Location: Right Arm, Patient Position: Sitting, Cuff Size: Large)   Pulse 77   Temp 98 F (36.7 C) (Temporal)   Ht 5' 6.25" (1.683 m)   Wt 216 lb 9 oz (98.2 kg)   LMP 04/03/2019   SpO2 98%   BMI 34.69 kg/m   Wt Readings from Last 3 Encounters:  04/17/19 216 lb 9 oz (98.2 kg)  03/10/19 229 lb 2 oz (103.9 kg)  06/22/18 219 lb 4 oz (99.5 kg)  Physical Exam Vitals signs and nursing note reviewed.  Constitutional:      General: She is not in acute distress.    Appearance: Normal appearance. She is well-developed. She is not ill-appearing.  HENT:     Head: Normocephalic and atraumatic.     Right Ear: Hearing, tympanic membrane, ear canal and external ear normal.     Left Ear: Hearing, tympanic membrane, ear canal and external ear normal.     Nose: Nose normal.     Mouth/Throat:     Mouth: Mucous membranes are moist.      Pharynx: Uvula midline. No oropharyngeal exudate or posterior oropharyngeal erythema.  Eyes:     General: No scleral icterus.    Extraocular Movements: Extraocular movements intact.     Conjunctiva/sclera: Conjunctivae normal.     Pupils: Pupils are equal, round, and reactive to light.  Neck:     Musculoskeletal: Normal range of motion and neck supple.  Cardiovascular:     Rate and Rhythm: Normal rate and regular rhythm.     Pulses: Normal pulses.          Radial pulses are 2+ on the right side and 2+ on the left side.     Heart sounds: Normal heart sounds. No murmur.  Pulmonary:     Effort: Pulmonary effort is normal. No respiratory distress.     Breath sounds: Normal breath sounds. No wheezing, rhonchi or rales.  Abdominal:     General: Abdomen is flat. Bowel sounds are normal. There is no distension.     Palpations: Abdomen is soft. There is no mass.     Tenderness: There is no abdominal tenderness. There is no guarding or rebound.     Hernia: No hernia is present.  Musculoskeletal: Normal range of motion.  Lymphadenopathy:     Cervical: No cervical adenopathy.  Skin:    General: Skin is warm and dry.     Findings: No rash.  Neurological:     General: No focal deficit present.     Mental Status: She is alert and oriented to person, place, and time.     Comments: CN grossly intact, station and gait intact  Psychiatric:        Mood and Affect: Mood normal.        Behavior: Behavior normal.        Thought Content: Thought content normal.        Judgment: Judgment normal.       Results for orders placed or performed in visit on 06/22/18  Lipid panel  Result Value Ref Range   Cholesterol 176 0 - 200 mg/dL   Triglycerides 111.0 0.0 - 149.0 mg/dL   HDL 43.90 >39.00 mg/dL   VLDL 22.2 0.0 - 40.0 mg/dL   LDL Cholesterol 110 (H) 0 - 99 mg/dL   Total CHOL/HDL Ratio 4    NonHDL 132.16   TSH  Result Value Ref Range   TSH 2.19 0.35 - 4.50 uIU/mL  CBC with Differential/Platelet   Result Value Ref Range   WBC 7.6 4.0 - 10.5 K/uL   RBC 4.34 3.87 - 5.11 Mil/uL   Hemoglobin 11.7 (L) 12.0 - 15.0 g/dL   HCT 34.9 (L) 36.0 - 46.0 %   MCV 80.3 78.0 - 100.0 fl   MCHC 33.7 30.0 - 36.0 g/dL   RDW 15.6 (H) 11.5 - 15.5 %   Platelets 318.0 150.0 - 400.0 K/uL   Neutrophils Relative % 49.6 43.0 - 77.0 %  Lymphocytes Relative 39.5 12.0 - 46.0 %   Monocytes Relative 6.2 3.0 - 12.0 %   Eosinophils Relative 4.5 0.0 - 5.0 %   Basophils Relative 0.2 0.0 - 3.0 %   Neutro Abs 3.8 1.4 - 7.7 K/uL   Lymphs Abs 3.0 0.7 - 4.0 K/uL   Monocytes Absolute 0.5 0.1 - 1.0 K/uL   Eosinophils Absolute 0.3 0.0 - 0.7 K/uL   Basophils Absolute 0.0 0.0 - 0.1 K/uL  Basic metabolic panel  Result Value Ref Range   Sodium 138 135 - 145 mEq/L   Potassium 4.2 3.5 - 5.1 mEq/L   Chloride 106 96 - 112 mEq/L   CO2 26 19 - 32 mEq/L   Glucose, Bld 91 70 - 99 mg/dL   BUN 15 6 - 23 mg/dL   Creatinine, Ser 0.99 0.40 - 1.20 mg/dL   Calcium 9.0 8.4 - 10.5 mg/dL   GFR 62.99 >60.00 mL/min  IBC panel  Result Value Ref Range   Iron 31 (L) 42 - 145 ug/dL   Transferrin 293.0 212.0 - 360.0 mg/dL   Saturation Ratios 7.6 (L) 20.0 - 50.0 %  Ferritin  Result Value Ref Range   Ferritin 8.8 (L) 10.0 - 291.0 ng/mL  Vitamin B12  Result Value Ref Range   Vitamin B-12 288 211 - 911 pg/mL  VITAMIN D 25 Hydroxy (Vit-D Deficiency, Fractures)  Result Value Ref Range   VITD 22.54 (L) 30.00 - 100.00 ng/mL   Assessment & Plan:   Problem List Items Addressed This Visit    Vitamin D deficiency   Relevant Orders   VITAMIN D 25 Hydroxy (Vit-D Deficiency, Fractures)   Obesity, Class I, BMI 30-34.9    Congratulated on weight loss! 13 lbs in the past month. Continue phentermine which she is tolerating well. rec RTC 1 mo f/u visit.       Low vitamin B12 level   Relevant Orders   Vitamin B12   IDA (iron deficiency anemia)    Update labs.       Relevant Orders   Basic metabolic panel   CBC with Differential/Platelet    Ferritin   IBC panel   Health maintenance examination - Primary    Preventative protocols reviewed and updated unless pt declined. Discussed healthy diet and lifestyle.       Acute low back pain without sciatica    Has started PT with benefit.        Other Visit Diagnoses    Special screening for malignant neoplasms, colon       Relevant Orders   Fecal occult blood, imunochemical       Meds ordered this encounter  Medications  . phentermine 30 MG capsule    Sig: Take 1 capsule (30 mg total) by mouth every morning.    Dispense:  30 capsule    Refill:  0   Orders Placed This Encounter  Procedures  . Fecal occult blood, imunochemical    Standing Status:   Future    Standing Expiration Date:   04/16/2020  . Basic metabolic panel  . CBC with Differential/Platelet  . VITAMIN D 25 Hydroxy (Vit-D Deficiency, Fractures)  . Vitamin B12  . Ferritin  . IBC panel    Follow up plan: Return in about 4 weeks (around 05/15/2019) for follow up visit.  Ria Bush, MD

## 2019-04-18 LAB — FERRITIN: Ferritin: 13.8 ng/mL (ref 10.0–291.0)

## 2019-04-18 LAB — VITAMIN D 25 HYDROXY (VIT D DEFICIENCY, FRACTURES): VITD: 31.12 ng/mL (ref 30.00–100.00)

## 2019-04-18 LAB — VITAMIN B12: Vitamin B-12: 217 pg/mL (ref 211–911)

## 2019-04-19 ENCOUNTER — Ambulatory Visit: Payer: 59

## 2019-04-19 NOTE — Telephone Encounter (Signed)
Please try to continue to contact patient and schedule appointment as instructed.

## 2019-04-19 NOTE — Telephone Encounter (Signed)
Quick question patient was seen for a physical on 04/17/19 do she still need this follow up? I tried calling patient and the voicemail was not set up. If she does need this appointment we might have to mail her a letter.

## 2019-04-20 NOTE — Telephone Encounter (Signed)
Yes she does, due to the wt loss med she's taking.

## 2019-04-20 NOTE — Telephone Encounter (Signed)
Pt states Dr. Darnell Level told her to follow up in 1 month or month and half. Pt is scheduled for 05/26/19.

## 2019-04-24 ENCOUNTER — Ambulatory Visit: Payer: 59

## 2019-05-04 ENCOUNTER — Other Ambulatory Visit (INDEPENDENT_AMBULATORY_CARE_PROVIDER_SITE_OTHER): Payer: 59

## 2019-05-04 DIAGNOSIS — Z1211 Encounter for screening for malignant neoplasm of colon: Secondary | ICD-10-CM | POA: Diagnosis not present

## 2019-05-04 LAB — FECAL OCCULT BLOOD, IMMUNOCHEMICAL: Fecal Occult Bld: NEGATIVE

## 2019-05-04 LAB — FECAL OCCULT BLOOD, GUAIAC: Fecal Occult Blood: NEGATIVE

## 2019-05-05 ENCOUNTER — Encounter: Payer: Self-pay | Admitting: Family Medicine

## 2019-05-23 ENCOUNTER — Other Ambulatory Visit: Payer: Self-pay | Admitting: Family Medicine

## 2019-05-23 ENCOUNTER — Encounter: Payer: Self-pay | Admitting: Family Medicine

## 2019-05-23 MED ORDER — PHENTERMINE HCL 30 MG PO CAPS: 30.0000 mg | ORAL_CAPSULE | ORAL | 0 refills | Status: DC

## 2019-05-23 NOTE — Addendum Note (Signed)
Addended by: Brenton Grills on: 01/09/3436 35:78 PM   Modules accepted: Orders

## 2019-05-23 NOTE — Addendum Note (Signed)
Addended by: Ria Bush on: 05/23/2019 05:27 PM   Modules accepted: Orders

## 2019-05-23 NOTE — Telephone Encounter (Signed)
Phentermine Last rx:  04/17/19, #30 Last OV:  04/17/19, CPE Next OV:  05/26/19, 1 mo f/u

## 2019-05-24 NOTE — Telephone Encounter (Signed)
Sent yesterday

## 2019-05-26 ENCOUNTER — Other Ambulatory Visit: Payer: Self-pay

## 2019-05-26 ENCOUNTER — Encounter: Payer: Self-pay | Admitting: Family Medicine

## 2019-05-26 ENCOUNTER — Ambulatory Visit: Payer: 59 | Admitting: Family Medicine

## 2019-05-26 VITALS — BP 124/76 | HR 90 | Temp 97.8°F | Ht 66.25 in | Wt 209.2 lb

## 2019-05-26 DIAGNOSIS — E669 Obesity, unspecified: Secondary | ICD-10-CM

## 2019-05-26 DIAGNOSIS — E538 Deficiency of other specified B group vitamins: Secondary | ICD-10-CM

## 2019-05-26 MED ORDER — B-12 1000 MCG SL SUBL: 1.0000 | SUBLINGUAL_TABLET | Freq: Every day | SUBLINGUAL | Status: DC

## 2019-05-26 NOTE — Assessment & Plan Note (Signed)
Ongoing weight loss with phentermine benefit, tolerating med well. Will continue prescription. 24 hour food recall reviewed with patient, encouraged renewed efforts at vegetables in diet as well as regular calcium intake. Discussed nutritionist referral, pt declines for now. RTC 1 mo f/u visit.

## 2019-05-26 NOTE — Progress Notes (Signed)
This visit was conducted in person.  BP 124/76 (BP Location: Left Arm, Patient Position: Sitting, Cuff Size: Large)   Pulse 90   Temp 97.8 F (36.6 C) (Temporal)   Ht 5' 6.25" (1.683 m)   Wt 209 lb 3 oz (94.9 kg)   LMP  (Within Months)   SpO2 98%   BMI 33.51 kg/m    CC: 1 mo f/u visit Subjective:    [name], female    DOB: December 01, 1965, 53 y.o.   MRN: [id number]  HPI: Jasmine Willis is a 53 y.o. female presenting on 05/26/2019 for Follow-up (Here for 1 mo f/u.)   Recently visited sister in [place].  Obesity - phentermine started 02/2019.  Starting weight 229lbs  Today's weight 209lbs   No HA, chest pain, insomnia.   Walking 2 miles daily (started last week). Enjoying this.   24 hour recall: Breakfast: 9am 2 slices of bacon, water -- 1 diet soda for the day, 20 grapes as a snack  Lunch: 12:15pm prepared meal from home - (ie 1/4 package of chicken with red peppers and onions, white rice, water)  -- snack when she gets home 1/4 cup of ice cream  Dinner: 6:30pm - 2 home made enchiladas, 1/4 cup rice, glass of wine      Relevant past medical, surgical, family and social history reviewed and updated as indicated. Interim medical history since our last visit reviewed. Allergies and medications reviewed and updated. Outpatient Medications Prior to Visit  Medication Sig Dispense Refill  . meloxicam (MOBIC) 15 MG tablet Take 1 tablet (15 mg total) by mouth daily as needed for pain. 30 tablet 3  . methocarbamol (ROBAXIN) 500 MG tablet Take 1-2 tablets (500-1,000 mg total) by mouth 3 (three) times daily as needed for muscle spasms (sedation precuations). 40 tablet 3  . phentermine 30 MG capsule Take 1 capsule (30 mg total) by mouth every morning. 30 capsule 0   No facility-administered medications prior to visit.      Per HPI unless specifically indicated in ROS section below Review of Systems Objective:    BP 124/76 (BP Location: Left Arm, Patient  Position: Sitting, Cuff Size: Large)   Pulse 90   Temp 97.8 F (36.6 C) (Temporal)   Ht 5' 6.25" (1.683 m)   Wt 209 lb 3 oz (94.9 kg)   LMP  (Within Months)   SpO2 98%   BMI 33.51 kg/m   Wt Readings from Last 3 Encounters:  05/26/19 209 lb 3 oz (94.9 kg)  04/17/19 216 lb 9 oz (98.2 kg)  03/10/19 229 lb 2 oz (103.9 kg)    Physical Exam Vitals signs and nursing note reviewed.  Constitutional:      General: She is not in acute distress.    Appearance: Normal appearance. She is not ill-appearing.  Cardiovascular:     Rate and Rhythm: Normal rate and regular rhythm.     Pulses: Normal pulses.     Heart sounds: Normal heart sounds. No murmur.  Pulmonary:     Effort: Pulmonary effort is normal. No respiratory distress.     Breath sounds: Normal breath sounds. No wheezing, rhonchi or rales.  Musculoskeletal:     Right lower leg: No edema.     Left lower leg: No edema.  Neurological:     Mental Status: She is alert.  Psychiatric:        Mood and Affect: Mood normal.        Behavior:  Behavior normal.       Assessment & Plan:   Problem List Items Addressed This Visit    Obesity, Class I, BMI 30-34.9 - Primary    Ongoing weight loss with phentermine benefit, tolerating med well. Will continue prescription. 24 hour food recall reviewed with patient, encouraged renewed efforts at vegetables in diet as well as regular calcium intake. Discussed nutritionist referral, pt declines for now. RTC 1 mo f/u visit.       Low vitamin B12 level    rec start 1000 mcg vit B12 dissolvable tablets.           Meds ordered this encounter  Medications  . Cyanocobalamin (B-12) 1000 MCG SUBL    Sig: Place 1 tablet under the tongue daily.   No orders of the defined types were placed in this encounter.   Patient Instructions  You are doing great today Congratulations on healthy changes! Phentermine refilled  Return as needed or in 1 month for follow up.   Follow up plan: Return in about  4 weeks (around 06/23/2019), or if symptoms worsen or fail to improve, for follow up visit.  Eustaquio Boyden, MD

## 2019-05-26 NOTE — Patient Instructions (Signed)
You are doing great today Congratulations on healthy changes! Phentermine refilled  Return as needed or in 1 month for follow up.

## 2019-05-26 NOTE — Assessment & Plan Note (Signed)
rec start 1000 mcg vit B12 dissolvable tablets.

## 2019-06-13 ENCOUNTER — Encounter: Payer: 59 | Admitting: Family Medicine

## 2019-06-27 ENCOUNTER — Other Ambulatory Visit: Payer: Self-pay

## 2019-06-27 ENCOUNTER — Encounter: Payer: Self-pay | Admitting: Family Medicine

## 2019-06-27 ENCOUNTER — Ambulatory Visit (INDEPENDENT_AMBULATORY_CARE_PROVIDER_SITE_OTHER): Payer: 59 | Admitting: Family Medicine

## 2019-06-27 VITALS — BP 122/80 | HR 94 | Temp 97.5°F | Ht 66.25 in | Wt 208.9 lb

## 2019-06-27 DIAGNOSIS — M7711 Lateral epicondylitis, right elbow: Secondary | ICD-10-CM | POA: Insufficient documentation

## 2019-06-27 DIAGNOSIS — E669 Obesity, unspecified: Secondary | ICD-10-CM

## 2019-06-27 MED ORDER — PHENTERMINE HCL 37.5 MG PO CAPS: 37.5000 mg | ORAL_CAPSULE | ORAL | 0 refills | Status: DC

## 2019-06-27 NOTE — Patient Instructions (Addendum)
I'm sorry about your sister! Ok to try higher phentermine dose (37.5mg  dose).  Return in 1 month for follow up visit.  Continue tennis elbow straps. Try voltaren gel to area. Do exercises provided today.

## 2019-06-27 NOTE — Assessment & Plan Note (Signed)
Exam consistent with this. Reviewed treatment plan including tennis elbow strap, topical NSAID use, and exercises provided today.

## 2019-06-27 NOTE — Assessment & Plan Note (Signed)
Some difficulty this past month after sister's death.  States current phentermine dose at times ineffective - will trial 37.5mg  dose.  Phentermine started 02/2019

## 2019-06-27 NOTE — Progress Notes (Signed)
This visit was conducted in person.  BP 122/80 (BP Location: Left Arm, Patient Position: Sitting, Cuff Size: Normal)   Pulse 94   Temp (!) 97.5 F (36.4 C) (Temporal)   Ht 5' 6.25" (1.683 m)   Wt 208 lb 14.4 oz (94.8 kg)   SpO2 96%   BMI 33.46 kg/m    CC: 1 mo f/u visit Subjective:    [name], female    DOB: 16-May-1966, 53 y.o.   MRN: [id number]  HPI: Jasmine Willis is a 53 y.o. female presenting on 06/27/2019 for Follow-up (no new concerns)   [place] sister passed away Jun 27, 2019. Some stress over this.  See prior note for details.  Phentermine started 02/2019.  Starting weight 229 lbs Today's weight 208 lbs  Denies HA, chest pain, tachycardia, insomnia.   Was walking 2 mi/day (started late 04/2019) - less this month.   Some R lateral elbow pain - tennis elbow strap helpful.      Relevant past medical, surgical, family and social history reviewed and updated as indicated. Interim medical history since our last visit reviewed. Allergies and medications reviewed and updated. Outpatient Medications Prior to Visit  Medication Sig Dispense Refill  . Cyanocobalamin (B-12) 1000 MCG SUBL Place 1 tablet under the tongue daily.    . meloxicam (MOBIC) 15 MG tablet Take 1 tablet (15 mg total) by mouth daily as needed for pain. 30 tablet 3  . methocarbamol (ROBAXIN) 500 MG tablet Take 1-2 tablets (500-1,000 mg total) by mouth 3 (three) times daily as needed for muscle spasms (sedation precuations). 40 tablet 3  . phentermine 30 MG capsule Take 1 capsule (30 mg total) by mouth every morning. 30 capsule 0   No facility-administered medications prior to visit.      Per HPI unless specifically indicated in ROS section below Review of Systems Objective:    BP 122/80 (BP Location: Left Arm, Patient Position: Sitting, Cuff Size: Normal)   Pulse 94   Temp (!) 97.5 F (36.4 C) (Temporal)   Ht 5' 6.25" (1.683 m)   Wt 208 lb 14.4 oz (94.8 kg)   SpO2 96%   BMI  33.46 kg/m   Wt Readings from Last 3 Encounters:  06/27/19 208 lb 14.4 oz (94.8 kg)  05/26/19 209 lb 3 oz (94.9 kg)  04/17/19 216 lb 9 oz (98.2 kg)    Physical Exam Vitals signs and nursing note reviewed.  Constitutional:      General: She is not in acute distress.    Appearance: Normal appearance. She is obese. She is not ill-appearing.  HENT:     Mouth/Throat:     Mouth: Mucous membranes are moist.     Pharynx: Oropharynx is clear.  Cardiovascular:     Rate and Rhythm: Normal rate and regular rhythm.     Pulses: Normal pulses.     Heart sounds: Normal heart sounds. No murmur.  Pulmonary:     Effort: Pulmonary effort is normal. No respiratory distress.     Breath sounds: Normal breath sounds. No wheezing, rhonchi or rales.  Musculoskeletal:        General: Tenderness present.     Right lower leg: No edema.     Left lower leg: No edema.     Comments: Mild discomfort to palpation distal to R lateral epicondyle, reproducible with testing forearm flexors against resistance as well as forced pronation  Neurological:     Mental Status: She is alert.  Psychiatric:  Mood and Affect: Mood normal.        Behavior: Behavior normal.       Results for orders placed or performed in visit on 05/05/19  Fecal Occult Blood, Guaiac  Result Value Ref Range   Fecal Occult Blood Negative    Assessment & Plan:   Problem List Items Addressed This Visit    Right tennis elbow    Exam consistent with this. Reviewed treatment plan including tennis elbow strap, topical NSAID use, and exercises provided today.       Obesity, Class I, BMI 30-34.9 - Primary    Some difficulty this past month after sister's death.  States current phentermine dose at times ineffective - will trial 37.5mg  dose.  Phentermine started 02/2019          Meds ordered this encounter  Medications  . phentermine 37.5 MG capsule    Sig: Take 1 capsule (37.5 mg total) by mouth every morning.    Dispense:  30  capsule    Refill:  0   No orders of the defined types were placed in this encounter.  Patient Instructions  I'm sorry about your sister! Ok to try higher phentermine dose (37.5mg  dose).  Return in 1 month for follow up visit.  Continue tennis elbow straps. Try voltaren gel to area. Do exercises provided today.    Follow up plan: Return in about 4 weeks (around 07/25/2019) for follow up visit.  Ria Bush, MD

## 2019-07-28 ENCOUNTER — Encounter: Payer: Self-pay | Admitting: Family Medicine

## 2019-07-28 ENCOUNTER — Other Ambulatory Visit: Payer: Self-pay

## 2019-07-28 ENCOUNTER — Ambulatory Visit (INDEPENDENT_AMBULATORY_CARE_PROVIDER_SITE_OTHER): Payer: 59 | Admitting: Family Medicine

## 2019-07-28 VITALS — BP 118/84 | HR 85 | Temp 97.1°F | Ht 66.0 in | Wt 201.6 lb

## 2019-07-28 DIAGNOSIS — E669 Obesity, unspecified: Secondary | ICD-10-CM | POA: Diagnosis not present

## 2019-07-28 DIAGNOSIS — E538 Deficiency of other specified B group vitamins: Secondary | ICD-10-CM

## 2019-07-28 MED ORDER — PHENTERMINE HCL 37.5 MG PO CAPS: 37.5000 mg | ORAL_CAPSULE | ORAL | 0 refills | Status: DC

## 2019-07-28 NOTE — Assessment & Plan Note (Signed)
Ongoing weight loss noted, tolerating phentermine well. Continue 37.5mg  daily. Requests #90 at end of year to be able to use flex spending account. RTC 6 wks f/u visit. 24 hour diet recall reviewed - encouraged continued work towards vegetables and more robust breakfast.

## 2019-07-28 NOTE — Progress Notes (Signed)
This visit was conducted in person.  BP 118/84 (BP Location: Left Arm, Patient Position: Sitting, Cuff Size: Large)   Pulse 85   Temp (!) 97.1 F (36.2 C)   Ht 5\' 6"  (1.676 m)   Wt 201 lb 9 oz (91.4 kg)   SpO2 100%   BMI 32.53 kg/m    CC: 1 mo f/u visit Subjective:    [name], female    DOB: 08-09-1966, 53 y.o.   MRN: [id number]  HPI: Jasmine Willis is a 53 y.o. female presenting on 07/28/2019 for 1 Month F/U Weight   See prior note for details.  MIL passed away last month - traveled to [place].   Phentermine started 02/2019  Starting weight 229 lbs  Today's weight 201 lbs   Last visit we increased phentermine to 37.5mg  daily.   Tolerating phentermine well without HA, chest pain, tachycardia, insomnia.   Walked 3 miles yesterday. Continues walking  24 hour recall: Breakfast: skipped breakfast while at work  [employer] - 2 halo oranges at work  [employer]: one pork tenderloin brought from home, diet sunkist and water  Dinner: portion of lasagna, 2 halo oranges, glass of wine      Relevant past medical, surgical, family and social history reviewed and updated as indicated. Interim medical history since our last visit reviewed. Allergies and medications reviewed and updated. Outpatient Medications Prior to Visit  Medication Sig Dispense Refill  . Cyanocobalamin (B-12) 1000 MCG SUBL Place 1 tablet under the tongue daily.    . meloxicam (MOBIC) 15 MG tablet Take 1 tablet (15 mg total) by mouth daily as needed for pain. 30 tablet 3  . methocarbamol (ROBAXIN) 500 MG tablet Take 1-2 tablets (500-1,000 mg total) by mouth 3 (three) times daily as needed for muscle spasms (sedation precuations). 40 tablet 3  . phentermine 37.5 MG capsule Take 1 capsule (37.5 mg total) by mouth every morning. 30 capsule 0   No facility-administered medications prior to visit.      Per HPI unless specifically indicated in ROS section below Review of Systems Objective:    BP  118/84 (BP Location: Left Arm, Patient Position: Sitting, Cuff Size: Large)   Pulse 85   Temp (!) 97.1 F (36.2 C)   Ht 5\' 6"  (1.676 m)   Wt 201 lb 9 oz (91.4 kg)   SpO2 100%   BMI 32.53 kg/m   Wt Readings from Last 3 Encounters:  07/28/19 201 lb 9 oz (91.4 kg)  06/27/19 208 lb 14.4 oz (94.8 kg)  05/26/19 209 lb 3 oz (94.9 kg)    Physical Exam Vitals signs and nursing note reviewed.  Constitutional:      General: She is not in acute distress.    Appearance: Normal appearance. She is obese. She is not ill-appearing.  Eyes:     Extraocular Movements: Extraocular movements intact.     Pupils: Pupils are equal, round, and reactive to light.  Cardiovascular:     Rate and Rhythm: Normal rate and regular rhythm.     Pulses: Normal pulses.     Heart sounds: Normal heart sounds. No murmur.  Pulmonary:     Effort: Pulmonary effort is normal. No respiratory distress.     Breath sounds: Normal breath sounds. No wheezing, rhonchi or rales.  Musculoskeletal:     Right lower leg: No edema.     Left lower leg: No edema.  Neurological:     Mental Status: She is alert.  Psychiatric:        Mood and Affect: Mood normal.        Behavior: Behavior normal.       Assessment & Plan:  This visit occurred during the SARS-CoV-2 public health emergency.  Safety protocols were in place, including screening questions prior to the visit, additional usage of staff PPE, and extensive cleaning of exam room while observing appropriate contact time as indicated for disinfecting solutions.   Problem List Items Addressed This Visit    Obesity, Class I, BMI 30-34.9    Ongoing weight loss noted, tolerating phentermine well. Continue 37.5mg  daily. Requests #90 at end of year to be able to use flex spending account. RTC 6 wks f/u visit. 24 hour diet recall reviewed - encouraged continued work towards vegetables and more robust breakfast.       Low vitamin B12 level    She is taking vit b12 daily.            Meds ordered this encounter  Medications  . phentermine 37.5 MG capsule    Sig: Take 1 capsule (37.5 mg total) by mouth every morning.    Dispense:  90 capsule    Refill:  0   No orders of the defined types were placed in this encounter.  Patient Instructions  Phentermine refilled. Ok to do 90 days at end of year.  Return in 6 weeks for follow up visit.  Continue working on exercise routine.  Continue working on [employer].    Follow up plan: Return in about 6 weeks (around 09/08/2019) for follow up visit.  Eustaquio Boyden, MD

## 2019-07-28 NOTE — Assessment & Plan Note (Signed)
She is taking vit b12 daily.

## 2019-07-28 NOTE — Patient Instructions (Addendum)
Phentermine refilled. Ok to do 90 days at end of year.  Return in 6 weeks for follow up visit.  Continue working on exercise routine.  Continue working on [employer].

## 2019-10-11 ENCOUNTER — Ambulatory Visit: Payer: 59 | Admitting: Family Medicine

## 2019-10-20 ENCOUNTER — Other Ambulatory Visit: Payer: Self-pay

## 2019-10-20 ENCOUNTER — Encounter: Payer: Self-pay | Admitting: Family Medicine

## 2019-10-20 ENCOUNTER — Ambulatory Visit: Payer: 59 | Admitting: Family Medicine

## 2019-10-20 VITALS — BP 130/80 | HR 86 | Temp 97.9°F | Ht 66.0 in | Wt 195.1 lb

## 2019-10-20 DIAGNOSIS — F419 Anxiety disorder, unspecified: Secondary | ICD-10-CM | POA: Diagnosis not present

## 2019-10-20 DIAGNOSIS — E669 Obesity, unspecified: Secondary | ICD-10-CM | POA: Diagnosis not present

## 2019-10-20 MED ORDER — HYDROXYZINE HCL 25 MG PO TABS: 25.0000 mg | ORAL_TABLET | Freq: Every day | ORAL | 0 refills | Status: DC | PRN

## 2019-10-20 MED ORDER — CONTRAVE 8-90 MG PO TB12: ORAL_TABLET | ORAL | 0 refills | Status: DC

## 2019-10-20 MED ORDER — PHENTERMINE HCL 37.5 MG PO CAPS: 37.5000 mg | ORAL_CAPSULE | ORAL | 0 refills | Status: DC

## 2019-10-20 NOTE — Assessment & Plan Note (Signed)
Ongoing family stressors around sister's recent death (2019/06/28). Requests PRN hydroxyzine which is reasonable.

## 2019-10-20 NOTE — Assessment & Plan Note (Signed)
Continued weight loss, continues tolerating phentermine well however has been on this for the past 6 months. Discussed would want to transition off stimulant onto other medication. Will price out contrave. Reviewed mechanism of action. RTC 4-6 wks f/u visit.

## 2019-10-20 NOTE — Progress Notes (Signed)
This visit was conducted in person.  BP 130/80 (BP Location: Left Arm, Patient Position: Sitting, Cuff Size: Large)   Pulse 86   Temp 97.9 F (36.6 C) (Temporal)   Ht 5\' 6"  (1.676 m)   Wt 195 lb 2 oz (88.5 kg)   LMP  (Within Months)   SpO2 97%   BMI 31.49 kg/m    CC: med refill  Subjective:    [name], female    DOB: 1966-04-25, 54 y.o.   MRN: [id number]  HPI: Jasmine Willis is a 54 y.o. female presenting on 10/20/2019 for Medication Refill (Here for f/u of phentermine. )   See prior not for details  Phentermine started 02/2019  Starting weight 229 lbs  Today's weight 195 lbs   Tolerating med well without HA, chest pain, tachycardia, insomnia.   Continues walking 1/2 hour daily.  Went hiking last weekend.   Previously on PRN medication for anxiety      Relevant past medical, surgical, family and social history reviewed and updated as indicated. Interim medical history since our last visit reviewed. Allergies and medications reviewed and updated. Outpatient Medications Prior to Visit  Medication Sig Dispense Refill  . Cyanocobalamin (B-12) 1000 MCG SUBL Place 1 tablet under the tongue daily.    . meloxicam (MOBIC) 15 MG tablet Take 1 tablet (15 mg total) by mouth daily as needed for pain. 30 tablet 3  . methocarbamol (ROBAXIN) 500 MG tablet Take 1-2 tablets (500-1,000 mg total) by mouth 3 (three) times daily as needed for muscle spasms (sedation precuations). 40 tablet 3  . phentermine 37.5 MG capsule Take 1 capsule (37.5 mg total) by mouth every morning. 90 capsule 0   No facility-administered medications prior to visit.     Per HPI unless specifically indicated in ROS section below Review of Systems Objective:    BP 130/80 (BP Location: Left Arm, Patient Position: Sitting, Cuff Size: Large)   Pulse 86   Temp 97.9 F (36.6 C) (Temporal)   Ht 5\' 6"  (1.676 m)   Wt 195 lb 2 oz (88.5 kg)   LMP  (Within Months)   SpO2 97%   BMI 31.49 kg/m    Wt Readings from Last 3 Encounters:  10/20/19 195 lb 2 oz (88.5 kg)  07/28/19 201 lb 9 oz (91.4 kg)  06/27/19 208 lb 14.4 oz (94.8 kg)    Physical Exam Vitals and nursing note reviewed.  Constitutional:      Appearance: Normal appearance. She is not ill-appearing.  Eyes:     Extraocular Movements: Extraocular movements intact.     Pupils: Pupils are equal, round, and reactive to light.  Cardiovascular:     Rate and Rhythm: Normal rate and regular rhythm.     Pulses: Normal pulses.     Heart sounds: Normal heart sounds. No murmur.  Pulmonary:     Effort: Pulmonary effort is normal. No respiratory distress.     Breath sounds: Normal breath sounds. No wheezing, rhonchi or rales.  Musculoskeletal:     Right lower leg: No edema.     Left lower leg: No edema.  Neurological:     Mental Status: She is alert.  Psychiatric:        Mood and Affect: Mood normal.        Behavior: Behavior normal.       Assessment & Plan:  This visit occurred during the SARS-CoV-2 public health emergency.  Safety protocols were in place, including screening  questions prior to the visit, additional usage of staff PPE, and extensive cleaning of exam room while observing appropriate contact time as indicated for disinfecting solutions.   Problem List Items Addressed This Visit    Obesity, Class I, BMI 30-34.9 - Primary    Continued weight loss, continues tolerating phentermine well however has been on this for the past 6 months. Discussed would want to transition off stimulant onto other medication. Will price out contrave. Reviewed mechanism of action. RTC 4-6 wks f/u visit.       Anxiety    Ongoing family stressors around sister's recent death (09-Jun-2019). Requests PRN hydroxyzine which is reasonable.       Relevant Medications   hydrOXYzine (ATARAX/VISTARIL) 25 MG tablet       Meds ordered this encounter  Medications  . phentermine 37.5 MG capsule    Sig: Take 1 capsule (37.5 mg total) by mouth  every morning.    Dispense:  30 capsule    Refill:  0  . hydrOXYzine (ATARAX/VISTARIL) 25 MG tablet    Sig: Take 1-2 tablets (25-50 mg total) by mouth daily as needed for anxiety.    Dispense:  30 tablet    Refill:  0  . Naltrexone-buPROPion HCl ER (CONTRAVE) 8-90 MG TB12    Sig: Take 1 tablet by mouth daily for 7 days, THEN 1 tablet in the morning and at bedtime.    Dispense:  60 tablet    Refill:  0    To price out   No orders of the defined types were placed in this encounter.   Patient Instructions  Phentermine refilled. Price out [employer] - sent to pharmacy.  I'd like to transition from phentermine to contrave over the next few months.  Ok to try hydroxyzine as needed for stress.    Follow up plan: Return in about 4 weeks (around 11/17/2019) for follow up visit.  Eustaquio Boyden, MD

## 2019-10-20 NOTE — Patient Instructions (Addendum)
Phentermine refilled. Price out [employer] - sent to pharmacy.  I'd like to transition from phentermine to contrave over the next few months.  Ok to try hydroxyzine as needed for stress.

## 2019-10-23 ENCOUNTER — Encounter: Payer: Self-pay | Admitting: Family Medicine

## 2019-10-24 ENCOUNTER — Telehealth: Payer: Self-pay

## 2019-10-24 MED ORDER — SAXENDA 18 MG/3ML ~~LOC~~ SOPN: PEN_INJECTOR | SUBCUTANEOUS | 0 refills | Status: DC

## 2019-10-24 NOTE — Telephone Encounter (Signed)
plz notify saxenda sent to pharmacy to price out

## 2019-10-24 NOTE — Telephone Encounter (Signed)
Patient contacted the office and wanted to inform Dr. Reece Agar that she has been looking into the options for her weight loss options, and she states the weight loss shots are the cheapest and this would be best for her. She would like an rx sent into the  [facility] pharmacy. Thank you!

## 2019-10-25 NOTE — Telephone Encounter (Signed)
Patient called about the RX that was sent She stated that her husband went to the pharmacy to pick this up and they advised him they would have to send it back over to [place] to get approval

## 2019-10-25 NOTE — Telephone Encounter (Signed)
Submitted PA for Saxenda; key:  [id number].  Decision pending.   Attempted to inform pt PA has been submitted.  No answer.  Vm box not set up.

## 2019-10-25 NOTE — Telephone Encounter (Signed)
Spoke with pt relaying Dr. G's message.  Verbalizes understanding.  

## 2019-10-27 NOTE — Telephone Encounter (Signed)
Received faxed PA approval valid 10/26/2019- 02/24/2020.  Pharmacy and pt aware.

## 2019-12-08 ENCOUNTER — Encounter: Payer: Self-pay | Admitting: Family Medicine

## 2019-12-08 ENCOUNTER — Ambulatory Visit: Payer: 59 | Admitting: Family Medicine

## 2019-12-08 ENCOUNTER — Other Ambulatory Visit: Payer: Self-pay

## 2019-12-08 VITALS — BP 120/66 | HR 82 | Temp 97.8°F | Ht 66.0 in | Wt 184.5 lb

## 2019-12-08 DIAGNOSIS — E663 Overweight: Secondary | ICD-10-CM | POA: Diagnosis not present

## 2019-12-08 DIAGNOSIS — F419 Anxiety disorder, unspecified: Secondary | ICD-10-CM | POA: Diagnosis not present

## 2019-12-08 MED ORDER — SAXENDA 18 MG/3ML ~~LOC~~ SOPN: 1.8000 mg | PEN_INJECTOR | Freq: Every day | SUBCUTANEOUS | 1 refills | Status: AC

## 2019-12-08 NOTE — Assessment & Plan Note (Addendum)
11 lb weight loss in the past month with commencement of saxenda! Pt tolerating well. will continue this. RTC 4-6 wks f/u visit.

## 2019-12-08 NOTE — Progress Notes (Signed)
This visit was conducted in person.  BP 120/66 (BP Location: Left Arm, Patient Position: Sitting, Cuff Size: Large)   Pulse 82   Temp 97.8 F (36.6 C) (Temporal)   Ht 5\' 6"  (1.676 m)   Wt 184 lb 8 oz (83.7 kg)   LMP 11/24/2019   SpO2 97%   BMI 29.78 kg/m    CC: 1 mo f/u visit  Subjective:    Patient ID: 01/24/2020, female    DOB: 1965-12-14, 54 y.o.   MRN: [id number]  HPI: RHODIE CIENFUEGOS is a 54 y.o. female presenting on 12/08/2019 for Medication Refill (Requests refill for Saxenda, however, med not originally on list. )   See prior note for details.  Phentermine started 02/2019  Starting weight 229 lbs  Today's weight 184.5 lbs   Seen here last month for ongoing weight management - after 6 months of phentermine - we transitioned to saxenda 1.8mg  daily. Tolerating daily shot well. Minimal nausea, no constipation or GI upset.  Continues walking regularly at lunch, eating small lunch at her desk.  No fmhx thyroid cancer.      Relevant past medical, surgical, family and social history reviewed and updated as indicated. Interim medical history since our last visit reviewed. Allergies and medications reviewed and updated. Outpatient Medications Prior to Visit  Medication Sig Dispense Refill  . Cyanocobalamin (B-12) 1000 MCG SUBL Place 1 tablet under the tongue daily.    . hydrOXYzine (ATARAX/VISTARIL) 25 MG tablet Take 1-2 tablets (25-50 mg total) by mouth daily as needed for anxiety. 30 tablet 0  . meloxicam (MOBIC) 15 MG tablet Take 1 tablet (15 mg total) by mouth daily as needed for pain. 30 tablet 3  . methocarbamol (ROBAXIN) 500 MG tablet Take 1-2 tablets (500-1,000 mg total) by mouth 3 (three) times daily as needed for muscle spasms (sedation precuations). 40 tablet 3  . Liraglutide -Weight Management (SAXENDA) 18 MG/3ML SOPN Inject into the skin daily.    . phentermine 37.5 MG capsule Take 1 capsule (37.5 mg total) by mouth every morning. 30 capsule 0   No  facility-administered medications prior to visit.     Per HPI unless specifically indicated in ROS section below Review of Systems Objective:    BP 120/66 (BP Location: Left Arm, Patient Position: Sitting, Cuff Size: Large)   Pulse 82   Temp 97.8 F (36.6 C) (Temporal)   Ht 5\' 6"  (1.676 m)   Wt 184 lb 8 oz (83.7 kg)   LMP 11/24/2019   SpO2 97%   BMI 29.78 kg/m   Wt Readings from Last 3 Encounters:  12/08/19 184 lb 8 oz (83.7 kg)  10/20/19 195 lb 2 oz (88.5 kg)  07/28/19 201 lb 9 oz (91.4 kg)    Physical Exam Vitals and nursing note reviewed.  Constitutional:      Appearance: Normal appearance. She is not ill-appearing.  Eyes:     Extraocular Movements: Extraocular movements intact.     Pupils: Pupils are equal, round, and reactive to light.  Cardiovascular:     Rate and Rhythm: Normal rate and regular rhythm.     Pulses: Normal pulses.     Heart sounds: Normal heart sounds. No murmur.  Pulmonary:     Effort: Pulmonary effort is normal. No respiratory distress.     Breath sounds: Normal breath sounds. No wheezing, rhonchi or rales.  Abdominal:     General: Abdomen is flat. Bowel sounds are normal. There is no distension.  Palpations: Abdomen is soft. There is no mass.     Tenderness: There is no abdominal tenderness. There is no guarding or rebound.     Hernia: No hernia is present.  Neurological:     Mental Status: She is alert.  Psychiatric:        Mood and Affect: Mood normal.        Behavior: Behavior normal.       Results for orders placed or performed in visit on 05/05/19  Fecal Occult Blood, Guaiac  Result Value Ref Range   Fecal Occult Blood Negative    Assessment & Plan:  This visit occurred during the SARS-CoV-2 public health emergency.  Safety protocols were in place, including screening questions prior to the visit, additional usage of staff PPE, and extensive cleaning of exam room while observing appropriate contact time as indicated for  disinfecting solutions.   Problem List Items Addressed This Visit    Overweight with body mass index (BMI) 25.0-29.9 - Primary    11 lb weight loss in the past month with commencement of saxenda! Pt tolerating well. will continue this. RTC 4-6 wks f/u visit.       Anxiety    Discussed PRN hydroxyzine use.           Meds ordered this encounter  Medications  . Liraglutide -Weight Management (SAXENDA) 18 MG/3ML SOPN    Sig: Inject 0.3 mLs (1.8 mg total) into the skin daily.    Dispense:  9 mL    Refill:  1   No orders of the defined types were placed in this encounter.   Follow up plan: Return in about 6 weeks (around 01/19/2020) for follow up visit.  Ria Bush, MD

## 2019-12-08 NOTE — Patient Instructions (Signed)
You are doing well today! Continue saxenda.

## 2019-12-08 NOTE — Assessment & Plan Note (Signed)
Discussed PRN hydroxyzine use.

## 2019-12-18 ENCOUNTER — Encounter: Payer: Self-pay | Admitting: Family Medicine

## 2020-02-13 ENCOUNTER — Telehealth: Payer: Self-pay

## 2020-02-13 NOTE — Telephone Encounter (Signed)
Received faxed notification of expiring PA for Saxenda.  Reauthorization requested.  Submitted PA; key:  [id number].  Decision pending.

## 2020-02-16 ENCOUNTER — Encounter: Payer: Self-pay | Admitting: Family Medicine

## 2020-02-16 ENCOUNTER — Other Ambulatory Visit: Payer: Self-pay

## 2020-02-16 ENCOUNTER — Ambulatory Visit (INDEPENDENT_AMBULATORY_CARE_PROVIDER_SITE_OTHER): Payer: 59 | Admitting: Family Medicine

## 2020-02-16 DIAGNOSIS — E663 Overweight: Secondary | ICD-10-CM

## 2020-02-16 MED ORDER — SAXENDA 18 MG/3ML ~~LOC~~ SOPN: 2.4000 mg | PEN_INJECTOR | Freq: Every day | SUBCUTANEOUS | 1 refills | Status: DC

## 2020-02-16 NOTE — Assessment & Plan Note (Signed)
5 lb weight loss in the past 2 months, on saxenda 1.8mg  daily and tolerating well. Will titrate dose to 2.4mg  daily and reassess at f/u visit. She continues healthy diet and lifestyle choices - walking 30 min regularly in the mornings before work. Pt agrees with plan.

## 2020-02-16 NOTE — Telephone Encounter (Signed)
Received faxed PA approval, valid 02/15/2020- 02/13/2021.

## 2020-02-16 NOTE — Patient Instructions (Addendum)
You are doing great! Let's try 2.4mg  dose of saxenda - if side effects, drop back to 1.8mg  dose.  Return in 2 months for physical.  Continue healthy diet and walking routine.

## 2020-02-16 NOTE — Progress Notes (Signed)
This visit was conducted in person.  BP 118/70 (BP Location: Left Arm, Patient Position: Sitting, Cuff Size: Normal)   Pulse 82   Temp 97.8 F (36.6 C) (Temporal)   Ht 5\' 6"  (1.676 m)   Wt 179 lb 8 oz (81.4 kg)   LMP  (Within Months)   SpO2 98%   BMI 28.97 kg/m    CC: weight management f/u Subjective:    Patient ID: , female    DOB: November 19, 1965, 54 y.o.   MRN: [id number]  HPI: Jasmine Willis is a 54 y.o. female presenting on 02/16/2020 for Weight Management (Here for f/u and med refill. )   See prior notes for details.  Phentermine started 02/2019  Transitioned to saxenda 10/2019 Starting weight 229 lbs (02/2019) Today's weight179.5 lbs (171 lb at home).   Continues tolerating medication well. No more nausea on current dose.  Continues walking regularly in the morning, eating small lunch at her desk - today had watermelon for lunch.      Relevant past medical, surgical, family and social history reviewed and updated as indicated. Interim medical history since our last visit reviewed. Allergies and medications reviewed and updated. Outpatient Medications Prior to Visit  Medication Sig Dispense Refill  . hydrOXYzine (ATARAX/VISTARIL) 25 MG tablet Take 1-2 tablets (25-50 mg total) by mouth daily as needed for anxiety. 30 tablet 0  . meloxicam (MOBIC) 15 MG tablet Take 1 tablet (15 mg total) by mouth daily as needed for pain. 30 tablet 3  . methocarbamol (ROBAXIN) 500 MG tablet Take 1-2 tablets (500-1,000 mg total) by mouth 3 (three) times daily as needed for muscle spasms (sedation precuations). 40 tablet 3  . Cyanocobalamin (B-12) 1000 MCG SUBL Place 1 tablet under the tongue daily.     No facility-administered medications prior to visit.     Per HPI unless specifically indicated in ROS section below Review of Systems Objective:  BP 118/70 (BP Location: Left Arm, Patient Position: Sitting, Cuff Size: Normal)   Pulse 82   Temp 97.8 F (36.6 C) (Temporal)    Ht 5\' 6"  (1.676 m)   Wt 179 lb 8 oz (81.4 kg)   LMP  (Within Months)   SpO2 98%   BMI 28.97 kg/m   Wt Readings from Last 3 Encounters:  02/16/20 179 lb 8 oz (81.4 kg)  12/08/19 184 lb 8 oz (83.7 kg)  10/20/19 195 lb 2 oz (88.5 kg)      Physical Exam Vitals and nursing note reviewed.  Constitutional:      Appearance: Normal appearance. She is not ill-appearing.  Neck:     Thyroid: No thyromegaly or thyroid tenderness.  Cardiovascular:     Rate and Rhythm: Normal rate and regular rhythm.     Pulses: Normal pulses.     Heart sounds: Normal heart sounds. No murmur heard.   Pulmonary:     Effort: Pulmonary effort is normal. No respiratory distress.     Breath sounds: Normal breath sounds. No wheezing, rhonchi or rales.  Musculoskeletal:     Cervical back: Normal range of motion and neck supple.  Skin:    General: Skin is warm and dry.     Findings: No rash.  Neurological:     Mental Status: She is alert.  Psychiatric:        Mood and Affect: Mood normal.        Behavior: Behavior normal.       Results for orders placed or performed  in visit on 05/05/19  Fecal Occult Blood, Guaiac  Result Value Ref Range   Fecal Occult Blood Negative    Assessment & Plan:  This visit occurred during the SARS-CoV-2 public health emergency.  Safety protocols were in place, including screening questions prior to the visit, additional usage of staff PPE, and extensive cleaning of exam room while observing appropriate contact time as indicated for disinfecting solutions.  Will return for CPE in August 2021.  Problem List Items Addressed This Visit    Overweight with body mass index (BMI) 25.0-29.9    5 lb weight loss in the past 2 months, on saxenda 1.8mg  daily and tolerating well. Will titrate dose to 2.4mg  daily and reassess at f/u visit. She continues healthy diet and lifestyle choices - walking 30 min regularly in the mornings before work. Pt agrees with plan.           Meds ordered  this encounter  Medications  . Liraglutide -Weight Management (SAXENDA) 18 MG/3ML SOPN    Sig: Inject 0.4 mLs (2.4 mg total) into the skin daily.    Dispense:  12 mL    Refill:  1   No orders of the defined types were placed in this encounter.   Patient Instructions  You are doing great! Let's try 2.4mg  dose of saxenda - if side effects, drop back to 1.8mg  dose.  Return in 2 months for physical.  Continue healthy diet and walking routine.   Follow up plan: Return in about 2 months (around 04/17/2020) for annual exam, prior fasting for blood work.  Ria Bush, MD

## 2020-05-03 ENCOUNTER — Ambulatory Visit: Payer: 59 | Admitting: Family Medicine

## 2020-05-07 ENCOUNTER — Encounter: Payer: Self-pay | Admitting: Family Medicine

## 2020-05-07 ENCOUNTER — Ambulatory Visit: Payer: 59 | Admitting: Family Medicine

## 2020-05-07 ENCOUNTER — Other Ambulatory Visit: Payer: Self-pay

## 2020-05-07 VITALS — BP 110/78 | HR 72 | Temp 98.4°F | Ht 66.0 in | Wt 178.3 lb

## 2020-05-07 DIAGNOSIS — Z23 Encounter for immunization: Secondary | ICD-10-CM | POA: Diagnosis not present

## 2020-05-07 DIAGNOSIS — E538 Deficiency of other specified B group vitamins: Secondary | ICD-10-CM | POA: Diagnosis not present

## 2020-05-07 DIAGNOSIS — D509 Iron deficiency anemia, unspecified: Secondary | ICD-10-CM | POA: Diagnosis not present

## 2020-05-07 DIAGNOSIS — Z1211 Encounter for screening for malignant neoplasm of colon: Secondary | ICD-10-CM

## 2020-05-07 DIAGNOSIS — E559 Vitamin D deficiency, unspecified: Secondary | ICD-10-CM | POA: Diagnosis not present

## 2020-05-07 DIAGNOSIS — E663 Overweight: Secondary | ICD-10-CM

## 2020-05-07 DIAGNOSIS — Z0000 Encounter for general adult medical examination without abnormal findings: Secondary | ICD-10-CM

## 2020-05-07 MED ORDER — VITAMIN D3 25 MCG (1000 UT) PO CAPS: 1.0000 | ORAL_CAPSULE | Freq: Every day | ORAL | Status: DC

## 2020-05-07 MED ORDER — CYANOCOBALAMIN 500 MCG PO TABS: 500.0000 ug | ORAL_TABLET | Freq: Every day | ORAL | Status: DC

## 2020-05-07 MED ORDER — SAXENDA 18 MG/3ML ~~LOC~~ SOPN: 3.0000 mg | PEN_INJECTOR | Freq: Every day | SUBCUTANEOUS | 1 refills | Status: DC

## 2020-05-07 NOTE — Assessment & Plan Note (Signed)
Continue replacement, update levels.

## 2020-05-07 NOTE — Assessment & Plan Note (Signed)
Overall maintaining weight loss. Congratulated.  Will increase saxenda to full 3mg  dose. Pt has tolerated well to date.

## 2020-05-07 NOTE — Assessment & Plan Note (Signed)
Update labs.  

## 2020-05-07 NOTE — Patient Instructions (Addendum)
Labs today.  Shingrix vaccine today. Schedule nurse visit in 2-6 months to complete the series.  Increase saxenda to 42m daily.  Pass by lab to pick up stool kit.  I do recommend mammogram - call to schedule [facility] at your convenience[facility]([phone] on latest tetanus date.  Call GYN for well woman exam.   Health Maintenance, Female Adopting a healthy lifestyle and getting preventive care are important in promoting health and wellness. Ask your health care provider about:  The right schedule for you to have regular tests and exams.  Things you can do on your own to prevent diseases and keep yourself healthy. What should I know about diet, weight, and exercise? Eat a healthy diet   Eat a diet that includes plenty of vegetables, fruits, low-fat dairy products, and lean protein.  Do not eat a lot of foods that are high in solid fats, added sugars, or sodium. Maintain a healthy weight Body mass index (BMI) is used to identify weight problems. It estimates body fat based on height and weight. Your health care provider can help determine your BMI and help you achieve or maintain a healthy weight. Get regular exercise Get regular exercise. This is one of the most important things you can do for your health. Most adults should:  Exercise for at least 150 minutes each week. The exercise should increase your heart rate and make you sweat (moderate-intensity exercise).  Do strengthening exercises at least twice a week. This is in addition to the moderate-intensity exercise.  Spend less time sitting. Even light physical activity can be beneficial. Watch cholesterol and blood lipids Have your blood tested for lipids and cholesterol at 52years of age, then have this test every 5 years. Have your cholesterol levels checked more often if:  Your lipid or cholesterol levels are high.  You are older than 52years of age.  You are at high risk for heart  disease. What should I know about cancer screening? Depending on your health history and family history, you may need to have cancer screening at various ages. This may include screening for:  Breast cancer.  Cervical cancer.  Colorectal cancer.  Skin cancer.  Lung cancer. What should I know about heart disease, diabetes, and high blood pressure? Blood pressure and heart disease  High blood pressure causes heart disease and increases the risk of stroke. This is more likely to develop in people who have high blood pressure readings, are of African descent, or are overweight.  Have your blood pressure checked: ? Every 3-5 years if you are 145319years of age. ? Every year if you are 428years old or older. Diabetes Have regular diabetes screenings. This checks your fasting blood sugar level. Have the screening done:  Once every three years after age 6924if you are at a normal weight and have a low risk for diabetes.  More often and at a younger age if you are overweight or have a high risk for diabetes. What should I know about preventing infection? Hepatitis B If you have a higher risk for hepatitis B, you should be screened for this virus. Talk with your health care provider to find out if you are at risk for hepatitis B infection. Hepatitis C Testing is recommended for:  Everyone born from [birth year]through 1963.  Anyone with known risk factors for hepatitis C. Sexually transmitted infections (STIs)  Get screened for STIs, including gonorrhea and chlamydia, if: ?  You are sexually active and are younger than 52 years of age. ? You are older than 52 years of age and your health care provider tells you that you are at risk for this type of infection. ? Your sexual activity has changed since you were last screened, and you are at increased risk for chlamydia or gonorrhea. Ask your health care provider if you are at risk.  Ask your health care provider about whether you are at high  risk for HIV. Your health care provider may recommend a prescription medicine to help prevent HIV infection. If you choose to take medicine to prevent HIV, you should first get tested for HIV. You should then be tested every 3 months for as long as you are taking the medicine. Pregnancy  If you are about to stop having your period (premenopausal) and you may become pregnant, seek counseling before you get pregnant.  Take 400 to 800 micrograms (mcg) of folic acid every day if you become pregnant.  Ask for birth control (contraception) if you want to prevent pregnancy. Osteoporosis and menopause Osteoporosis is a disease in which the bones lose minerals and strength with aging. This can result in bone fractures. If you are 76 years old or older, or if you are at risk for osteoporosis and fractures, ask your health care provider if you should:  Be screened for bone loss.  Take a calcium or vitamin D supplement to lower your risk of fractures.  Be given hormone replacement therapy (HRT) to treat symptoms of menopause. Follow these instructions at home: Lifestyle  Do not use any products that contain nicotine or tobacco, such as cigarettes, e-cigarettes, and chewing tobacco. If you need help quitting, ask your health care provider.  Do not use street drugs.  Do not share needles.  Ask your health care provider for help if you need support or information about quitting drugs. Alcohol use  Do not drink alcohol if: ? Your health care provider tells you not to drink. ? You are pregnant, may be pregnant, or are planning to become pregnant.  If you drink alcohol: ? Limit how much you use to 0-1 drink a day. ? Limit intake if you are breastfeeding.  Be aware of how much alcohol is in your drink. In the [place] one drink equals one 12 oz bottle of beer (355 mL), one 5 oz glass of wine (148 mL), or one 1 oz glass of hard liquor (44 mL). General instructions  Schedule regular health, dental,  and eye exams.  Stay current with your vaccines.  Tell your health care provider if: ? You often feel depressed. ? You have ever been abused or do not feel safe at home. Summary  Adopting a healthy lifestyle and getting preventive care are important in promoting health and wellness.  Follow your health care provider's instructions about healthy diet, exercising, and getting tested or screened for diseases.  Follow your health care provider's instructions on monitoring your cholesterol and blood pressure. This information is not intended to replace advice given to you by your health care provider. Make sure you discuss any questions you have with your health care provider. Document Revised: 08/03/2018 Document Reviewed: 08/03/2018 Elsevier Patient Education  2020 [employer].

## 2020-05-07 NOTE — Progress Notes (Signed)
This visit was conducted in person.  BP 110/78 (BP Location: Left Arm, Patient Position: Sitting, Cuff Size: Normal)   Pulse 72   Temp 98.4 F (36.9 C) (Temporal)   Ht '5\' 6"'  (1.676 m)   Wt 178 lb 5 oz (80.9 kg)   LMP 04/29/2020 Comment: irregular  SpO2 97%   BMI 28.78 kg/m    CC: CPE Subjective:    [name], female    DOB: 1965/12/30, 54 y.o.   MRN: [id number]  HPI: Jasmine Willis is a 54 y.o. female presenting on 05/07/2020 for Annual Exam and Follow-up (Also here for wt mngmt f/u.)   See prior note for details.   Phentermine started 02/2019, transitioned to saxenda 10/2019. Currently on saxenda 2.44m daily.  Starting weight 229 lbs (02/2019) - today's weight 178lbs.   Last ate at noon.   Preventative: Well woman with KRayne Duseen last year with normal pap smear.  LMP - last week, some daily spotting - wears pad  Colon cancer screening - discussed, would like stool kit.  Breast cancer screening - due for mammogram . Flu shot at work Tetanus - unsure - check with employee health on latest tetanus shot  COVID vaccine - Moderna completed  Shingrix - discussed - interested  Seat belt use discussed Sunscreen use discussed, no changing moles on skin  Non smoker Alcohol - occasional glass of wine  Dentist q6 mo  Eye exam yearly   Lives with husband and 2 children, 1 dog Edu: some college Occupation: works at [employer]Activity: walking 2 miles 3x/wk  Diet: good water, fruits/vegetables daily     Relevant past medical, surgical, family and social history reviewed and updated as indicated. Interim medical history since our last visit reviewed. Allergies and medications reviewed and updated. Outpatient Medications Prior to Visit  Medication Sig Dispense Refill  . hydrOXYzine (ATARAX/VISTARIL) 25 MG tablet Take 1-2 tablets (25-50 mg total) by mouth daily as needed for anxiety. 30 tablet 0  . meloxicam (MOBIC) 15 MG tablet Take 1 tablet  (15 mg total) by mouth daily as needed for pain. 30 tablet 3  . methocarbamol (ROBAXIN) 500 MG tablet Take 1-2 tablets (500-1,000 mg total) by mouth 3 (three) times daily as needed for muscle spasms (sedation precuations). 40 tablet 3  . Liraglutide -Weight Management (SAXENDA) 18 MG/3ML SOPN Inject 0.4 mLs (2.4 mg total) into the skin daily. 12 mL 1   No facility-administered medications prior to visit.     Per HPI unless specifically indicated in ROS section below Review of Systems  Constitutional: Negative for activity change, appetite change, chills, fatigue, fever and unexpected weight change.  HENT: Negative for hearing loss.   Eyes: Negative for visual disturbance.  Respiratory: Negative for cough, chest tightness, shortness of breath and wheezing.   Cardiovascular: Negative for chest pain, palpitations and leg swelling.  Gastrointestinal: Negative for abdominal distention, abdominal pain, blood in stool, constipation, diarrhea, nausea and vomiting.  Genitourinary: Negative for difficulty urinating and hematuria.  Musculoskeletal: Negative for arthralgias, myalgias and neck pain.  Skin: Negative for rash.  Neurological: Negative for dizziness, seizures, syncope and headaches.  Hematological: Negative for adenopathy. Does not bruise/bleed easily.  Psychiatric/Behavioral: Negative for dysphoric mood. The patient is not nervous/anxious.    Objective:  BP 110/78 (BP Location: Left Arm, Patient Position: Sitting, Cuff Size: Normal)   Pulse 72   Temp 98.4 F (36.9 C) (Temporal)   Ht '5\' 6"'  (1.676 m)  Wt 178 lb 5 oz (80.9 kg)   LMP 04/29/2020 Comment: irregular  SpO2 97%   BMI 28.78 kg/m   Wt Readings from Last 3 Encounters:  05/07/20 178 lb 5 oz (80.9 kg)  02/16/20 179 lb 8 oz (81.4 kg)  12/08/19 184 lb 8 oz (83.7 kg)      Physical Exam Vitals and nursing note reviewed.  Constitutional:      General: She is not in acute distress.    Appearance: Normal appearance. She is  well-developed. She is not ill-appearing.  HENT:     Head: Normocephalic and atraumatic.     Right Ear: Hearing, tympanic membrane, ear canal and external ear normal.     Left Ear: Hearing, tympanic membrane, ear canal and external ear normal.     Mouth/Throat:     Pharynx: Uvula midline.  Eyes:     General: No scleral icterus.    Extraocular Movements: Extraocular movements intact.     Conjunctiva/sclera: Conjunctivae normal.     Pupils: Pupils are equal, round, and reactive to light.  Cardiovascular:     Rate and Rhythm: Normal rate and regular rhythm.     Pulses: Normal pulses.          Radial pulses are 2+ on the right side and 2+ on the left side.     Heart sounds: Normal heart sounds. No murmur heard.   Pulmonary:     Effort: Pulmonary effort is normal. No respiratory distress.     Breath sounds: Normal breath sounds. No wheezing, rhonchi or rales.  Abdominal:     General: Abdomen is flat. Bowel sounds are normal. There is no distension.     Palpations: Abdomen is soft. There is no mass.     Tenderness: There is no abdominal tenderness. There is no guarding or rebound.     Hernia: No hernia is present.  Musculoskeletal:        General: Normal range of motion.     Cervical back: Normal range of motion and neck supple.     Right lower leg: No edema.     Left lower leg: No edema.  Lymphadenopathy:     Cervical: No cervical adenopathy.  Skin:    General: Skin is warm and dry.     Findings: No rash.  Neurological:     General: No focal deficit present.     Mental Status: She is alert and oriented to person, place, and time.     Comments: CN grossly intact, station and gait intact  Psychiatric:        Mood and Affect: Mood normal.        Behavior: Behavior normal.        Thought Content: Thought content normal.        Judgment: Judgment normal.       Results for orders placed or performed in visit on 05/05/19  Fecal Occult Blood, Guaiac  Result Value Ref Range    Fecal Occult Blood Negative    Assessment & Plan:  This visit occurred during the SARS-CoV-2 public health emergency.  Safety protocols were in place, including screening questions prior to the visit, additional usage of staff PPE, and extensive cleaning of exam room while observing appropriate contact time as indicated for disinfecting solutions.   Problem List Items Addressed This Visit    Vitamin D deficiency    Update levels.       Relevant Orders   VITAMIN D 25 Hydroxy (Vit-D Deficiency, Fractures)  Overweight with body mass index (BMI) 25.0-29.9    Overall maintaining weight loss. Congratulated.  Will increase saxenda to full 79m dose. Pt has tolerated well to date.       Relevant Orders   Comprehensive metabolic panel   Lipid panel   TSH   Low vitamin B12 level    Continue replacement, update levels.       Relevant Orders   Vitamin B12   IDA (iron deficiency anemia)    Update labs.       Relevant Medications   vitamin B-12 (V-R VITAMIN B-12) 500 MCG tablet   Other Relevant Orders   TSH   CBC with Differential/Platelet   Ferritin   IBC panel   Health maintenance examination - Primary    Preventative protocols reviewed and updated unless pt declined. Discussed healthy diet and lifestyle.        Other Visit Diagnoses    Special screening for malignant neoplasms, colon       Relevant Orders   Fecal occult blood, imunochemical   Need for shingles vaccine       Relevant Orders   Varicella-zoster vaccine IM (Completed)       Meds ordered this encounter  Medications  . Liraglutide -Weight Management (SAXENDA) 18 MG/3ML SOPN    Sig: Inject 3 mg into the skin daily.    Dispense:  15 mL    Refill:  1  . vitamin B-12 (V-R VITAMIN B-12) 500 MCG tablet    Sig: Take 1 tablet (500 mcg total) by mouth daily.  . Cholecalciferol (VITAMIN D3) 25 MCG (1000 UT) CAPS    Sig: Take 1 capsule (1,000 Units total) by mouth daily.    Dispense:  30 capsule   Orders Placed  This Encounter  Procedures  . Fecal occult blood, imunochemical    Standing Status:   Future    Standing Expiration Date:   05/07/2021  . Varicella-zoster vaccine IM  . Comprehensive metabolic panel  . Lipid panel  . TSH  . CBC with Differential/Platelet  . Vitamin B12  . Ferritin  . IBC panel  . VITAMIN D 25 Hydroxy (Vit-D Deficiency, Fractures)    Patient instructions: Labs today.  Shingrix vaccine today. Schedule nurse visit in 2-6 months to complete the series.  Increase saxenda to 354mdaily.  Pass by lab to pick up stool kit.  I do recommend mammogram - call to schedule [facility] at your convenience[phone] on latest tetanus date.  Call GYN for well woman exam.   Follow up plan: Return in about 6 weeks (around 06/18/2020) for follow up visit.  JaRia BushMD

## 2020-05-07 NOTE — Assessment & Plan Note (Signed)
Update levels. 

## 2020-05-07 NOTE — Assessment & Plan Note (Signed)
Preventative protocols reviewed and updated unless pt declined. Discussed healthy diet and lifestyle.  

## 2020-05-08 ENCOUNTER — Encounter: Payer: Self-pay | Admitting: Family Medicine

## 2020-05-08 LAB — CBC WITH DIFFERENTIAL/PLATELET
Basophils Absolute: 0.1 10*3/uL (ref 0.0–0.1)
Basophils Relative: 1.2 % (ref 0.0–3.0)
Eosinophils Absolute: 0.3 10*3/uL (ref 0.0–0.7)
Eosinophils Relative: 3.4 % (ref 0.0–5.0)
HCT: 36.3 % (ref 36.0–46.0)
Hemoglobin: 12.3 g/dL (ref 12.0–15.0)
Lymphocytes Relative: 35.8 % (ref 12.0–46.0)
Lymphs Abs: 2.8 10*3/uL (ref 0.7–4.0)
MCHC: 34 g/dL (ref 30.0–36.0)
MCV: 85.6 fl (ref 78.0–100.0)
Monocytes Absolute: 0.5 10*3/uL (ref 0.1–1.0)
Monocytes Relative: 5.9 % (ref 3.0–12.0)
Neutro Abs: 4.2 10*3/uL (ref 1.4–7.7)
Neutrophils Relative %: 53.7 % (ref 43.0–77.0)
Platelets: 276 10*3/uL (ref 150.0–400.0)
RBC: 4.24 Mil/uL (ref 3.87–5.11)
RDW: 13.8 % (ref 11.5–15.5)
WBC: 7.8 10*3/uL (ref 4.0–10.5)

## 2020-05-08 LAB — COMPREHENSIVE METABOLIC PANEL
ALT: 11 U/L (ref 0–35)
AST: 14 U/L (ref 0–37)
Albumin: 4.1 g/dL (ref 3.5–5.2)
Alkaline Phosphatase: 51 U/L (ref 39–117)
BUN: 10 mg/dL (ref 6–23)
CO2: 27 mEq/L (ref 19–32)
Calcium: 9 mg/dL (ref 8.4–10.5)
Chloride: 105 mEq/L (ref 96–112)
Creatinine, Ser: 0.84 mg/dL (ref 0.40–1.20)
GFR: 71.11 mL/min (ref >60.00)
Glucose, Bld: 82 mg/dL (ref 70–99)
Potassium: 4.2 mEq/L (ref 3.5–5.1)
Sodium: 139 mEq/L (ref 135–145)
Total Bilirubin: 0.4 mg/dL (ref 0.2–1.2)
Total Protein: 6.4 g/dL (ref 6.0–8.3)

## 2020-05-08 LAB — LIPID PANEL
Cholesterol: 176 mg/dL (ref 0–200)
HDL: 52.6 mg/dL (ref >39.00)
LDL Cholesterol: 108 mg/dL — ABNORMAL HIGH (ref 0–99)
NonHDL: 123.61
Total CHOL/HDL Ratio: 3
Triglycerides: 76 mg/dL (ref 0.0–149.0)
VLDL: 15.2 mg/dL (ref 0.0–40.0)

## 2020-05-08 LAB — FERRITIN: Ferritin: 12 ng/mL (ref 10.0–291.0)

## 2020-05-08 LAB — VITAMIN D 25 HYDROXY (VIT D DEFICIENCY, FRACTURES): VITD: 27.68 ng/mL — ABNORMAL LOW (ref 30.00–100.00)

## 2020-05-08 LAB — TSH: TSH: 2.19 u[IU]/mL (ref 0.35–4.50)

## 2020-05-08 LAB — IBC PANEL
Iron: 61 ug/dL (ref 42–145)
Saturation Ratios: 15.6 % — ABNORMAL LOW (ref 20.0–50.0)
Transferrin: 279 mg/dL (ref 212.0–360.0)

## 2020-05-08 LAB — VITAMIN B12: Vitamin B-12: 229 pg/mL (ref 211–911)

## 2020-05-08 NOTE — Telephone Encounter (Signed)
Updated pt's chart.  

## 2020-05-23 DIAGNOSIS — N939 Abnormal uterine and vaginal bleeding, unspecified: Secondary | ICD-10-CM | POA: Diagnosis not present

## 2020-05-23 DIAGNOSIS — Z86018 Personal history of other benign neoplasm: Secondary | ICD-10-CM | POA: Diagnosis not present

## 2020-05-23 DIAGNOSIS — Z01419 Encounter for gynecological examination (general) (routine) without abnormal findings: Secondary | ICD-10-CM | POA: Diagnosis not present

## 2020-05-24 ENCOUNTER — Telehealth: Payer: Self-pay | Admitting: Family Medicine

## 2020-05-24 NOTE — Telephone Encounter (Signed)
A few office visits have popped up in her chart from an outside health system from 12/2016 - 04/2017 - that mention kidney transplant status and nephrology visits - this must be a mistake.  Please verify with patient that she has not had kidney transplant and if that is the case please forward to [facility] to try to get those visits removed from her medical record.

## 2020-05-27 NOTE — Telephone Encounter (Signed)
Spoke with pt asking about a kidney transplant.  States it is not her but her husband, Toyoko Silos- 02/09/1960.  FYI to Dr. Reece Agar.    Forwarded to [place].

## 2020-05-27 NOTE — Telephone Encounter (Signed)
I have placed a ticket with IT and they will forward to the HIM department to transfer the kidney transfer records from 10/2016, 12/2016, 02/2017 and 04/2017 that are in the wrong chart to her husbands file.

## 2020-06-11 DIAGNOSIS — N939 Abnormal uterine and vaginal bleeding, unspecified: Secondary | ICD-10-CM | POA: Diagnosis not present

## 2020-08-22 ENCOUNTER — Encounter: Payer: Self-pay | Admitting: Family Medicine

## 2020-08-22 NOTE — Telephone Encounter (Signed)
Saxenda Last rx:  05/07/20, 15 ml Last OV:  05/07/20, CPE Next OV:  none

## 2020-08-23 ENCOUNTER — Other Ambulatory Visit: Payer: Self-pay | Admitting: Family Medicine

## 2020-08-23 MED ORDER — SAXENDA 18 MG/3ML ~~LOC~~ SOPN: 3.0000 mg | PEN_INJECTOR | Freq: Every day | SUBCUTANEOUS | 0 refills | Status: DC

## 2020-08-23 NOTE — Telephone Encounter (Signed)
ERx 

## 2020-09-02 ENCOUNTER — Other Ambulatory Visit: Payer: Self-pay | Admitting: Obstetrics and Gynecology

## 2020-09-02 DIAGNOSIS — N840 Polyp of corpus uteri: Secondary | ICD-10-CM | POA: Diagnosis not present

## 2020-09-02 DIAGNOSIS — N939 Abnormal uterine and vaginal bleeding, unspecified: Secondary | ICD-10-CM | POA: Diagnosis not present

## 2020-09-02 DIAGNOSIS — N921 Excessive and frequent menstruation with irregular cycle: Secondary | ICD-10-CM | POA: Diagnosis not present

## 2020-09-30 DIAGNOSIS — Z01818 Encounter for other preprocedural examination: Secondary | ICD-10-CM | POA: Diagnosis not present

## 2020-10-03 DIAGNOSIS — F5222 Female sexual arousal disorder: Secondary | ICD-10-CM | POA: Diagnosis not present

## 2020-10-03 DIAGNOSIS — N921 Excessive and frequent menstruation with irregular cycle: Secondary | ICD-10-CM | POA: Diagnosis not present

## 2020-10-03 DIAGNOSIS — Z3200 Encounter for pregnancy test, result unknown: Secondary | ICD-10-CM | POA: Diagnosis not present

## 2020-11-01 ENCOUNTER — Telehealth: Payer: Self-pay | Admitting: Family Medicine

## 2020-11-01 NOTE — Telephone Encounter (Signed)
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 Her husband, audree, schrecengost 02[birth year]09[birth year]60, sees Dr.Letvak.

## 2020-11-01 NOTE — Telephone Encounter (Addendum)
Since husband is the actual pt that needs the care, I think this should be placed as a phone note for Dr Alphonsus Sias in husband's chart.  Thanks, let me know if any questions

## 2020-11-06 ENCOUNTER — Encounter: Payer: Self-pay | Admitting: Family Medicine

## 2020-11-06 ENCOUNTER — Other Ambulatory Visit: Payer: Self-pay | Admitting: Family Medicine

## 2020-11-06 ENCOUNTER — Ambulatory Visit (INDEPENDENT_AMBULATORY_CARE_PROVIDER_SITE_OTHER): Payer: 59 | Admitting: Family Medicine

## 2020-11-06 ENCOUNTER — Other Ambulatory Visit: Payer: Self-pay

## 2020-11-06 VITALS — BP 122/80 | HR 76 | Temp 97.7°F | Ht 66.0 in | Wt 189.1 lb

## 2020-11-06 DIAGNOSIS — E669 Obesity, unspecified: Secondary | ICD-10-CM

## 2020-11-06 DIAGNOSIS — K0889 Other specified disorders of teeth and supporting structures: Secondary | ICD-10-CM | POA: Diagnosis not present

## 2020-11-06 DIAGNOSIS — M25561 Pain in right knee: Secondary | ICD-10-CM

## 2020-11-06 MED ORDER — SAXENDA 18 MG/3ML ~~LOC~~ SOPN: PEN_INJECTOR | SUBCUTANEOUS | 0 refills | Status: DC

## 2020-11-06 MED ORDER — AMOXICILLIN-POT CLAVULANATE 875-125 MG PO TABS: 1.0000 | ORAL_TABLET | Freq: Two times a day (BID) | ORAL | 0 refills | Status: DC

## 2020-11-06 NOTE — Patient Instructions (Addendum)
saxenda refilled - start slow taper as per instructions. augmentin refilled today for dental issue - call to schedule dentist appointment.  For right knee - likely knee strain after walking on the beach. Supportive care- elevation of leg, rest, stretching, exercises provided today. May use ice or heating pad, may use tylenol or ibuprofen as needed. Let us know if not improving as expected

## 2020-11-06 NOTE — Progress Notes (Addendum)
[name], female    DOB: [birth year], 55 y.o.   MRN: [id number]  This visit was conducted in person.  BP 122/80   Pulse 76   Temp 97.7 F (36.5 C) (Temporal)   Ht 5\' 6"  (1.676 m)   Wt 189 lb 2 oz (85.8 kg)   SpO2 98%   BMI 30.53 kg/m    CC: weight management visit  Subjective:   HPI: Jasmine Willis is a 55 y.o. female presenting on 11/06/2020 for Weight Management (Wants to discuss starting a diet pill. ) and Knee Pain (C/o right knee pain.  Started 11/03/20.  H/o arthritis. )   Starting weight: 229 lbs (02/2019) Last weight: 178 lbs (04/2020) Today's weight 189 lbs 11/11/20   Phentermine started 02/2019 - transitioned to saxenda 10/2019, up to 3mg  daily which started causing GI upset and nausea so she stopped this. Off saxenda for the past month.   Did have endometrial ablation - led to amenorrhea ([place]). Notes decreased libido since.   24 hour recall:   Activity regimen: Walking regularly during lunch - 30 min daily.   R knee pain started 3d ago associated with limping. Denies inciting trauma/injury or falls. She did recently go to beach and walked on sand. Also helped a friend move. Points to anterior knee pain. No locking of knee. Treating with ES tylenol with some benefit.   Dental pain planning to see dentist - asks about abx.      Relevant past medical, surgical, family and social history reviewed and updated as indicated. Interim medical history since our last visit reviewed. Allergies and medications reviewed and updated. Outpatient Medications Prior to Visit  Medication Sig Dispense Refill  . Cholecalciferol (VITAMIN D3) 25 MCG (1000 UT) CAPS Take 1 capsule (1,000 Units total) by mouth daily. 30 capsule   . hydrOXYzine (ATARAX/VISTARIL) 25 MG tablet Take 1-2 tablets (25-50 mg total) by mouth daily as needed for anxiety. 30 tablet 0  . Liraglutide -Weight Management (SAXENDA) 18 MG/3ML SOPN Inject 3 mg into the skin daily. 45 mL 0  .  meloxicam (MOBIC) 15 MG tablet Take 1 tablet (15 mg total) by mouth daily as needed for pain. 30 tablet 3  . methocarbamol (ROBAXIN) 500 MG tablet Take 1-2 tablets (500-1,000 mg total) by mouth 3 (three) times daily as needed for muscle spasms (sedation precuations). 40 tablet 3  . UNIFINE PENTIPS 32G X 4 MM MISC USE AS DIRECTED 100 each 0  . vitamin B-12 (V-R VITAMIN B-12) 500 MCG tablet Take 1 tablet (500 mcg total) by mouth daily.     No facility-administered medications prior to visit.     Per HPI unless specifically indicated in ROS section below Review of Systems Objective:  BP 122/80   Pulse 76   Temp 97.7 F (36.5 C) (Temporal)   Ht 5\' 6"  (1.676 m)   Wt 189 lb 2 oz (85.8 kg)   SpO2 98%   BMI 30.53 kg/m   Wt Readings from Last 3 Encounters:  11/06/20 189 lb 2 oz (85.8 kg)  05/07/20 178 lb 5 oz (80.9 kg)  02/16/20 179 lb 8 oz (81.4 kg)      Physical Exam Vitals and nursing note reviewed.  Constitutional:      Appearance: Normal appearance. She is not ill-appearing.  HENT:     Mouth/Throat:     Dentition: Abnormal dentition. Dental tenderness (left lower incisor/canine) and gingival swelling present. No dental abscesses.  Cardiovascular:  Rate and Rhythm: Normal rate and regular rhythm.     Pulses: Normal pulses.     Heart sounds: Normal heart sounds. No murmur heard.   Pulmonary:     Effort: Pulmonary effort is normal. No respiratory distress.     Breath sounds: Normal breath sounds. No wheezing, rhonchi or rales.  Musculoskeletal:     Right lower leg: No edema.     Left lower leg: No edema.     Comments:  Left knee WNL R knee exam: No deformity on inspection. Discomfort with palpation of medial knee  No effusion/swelling noted. FROM in flex/extension with mild crepitus. No popliteal fullness. Neg drawer test. Neg mcmurray test. No pain with valgus/varus stress. No PFgrind. No abnormal patellar mobility.   Neurological:     Mental Status: She is  alert.  Psychiatric:        Mood and Affect: Mood normal.        Behavior: Behavior normal.       Lab Results  Component Value Date   VITAMINB12 229 05/07/2020    Assessment & Plan:  This visit occurred during the SARS-CoV-2 public health emergency.  Safety protocols were in place, including screening questions prior to the visit, additional usage of staff PPE, and extensive cleaning of exam room while observing appropriate contact time as indicated for disinfecting solutions.   Problem List Items Addressed This Visit    Obesity (BMI 30.0-34.9) - Primary    Weight gain noted since saxenda stopped - will restart with slow taper planned to 2.4mg  daily. Nausea at 3mg  dose.       Right knee pain    Acute knee pain that started after walking on beach - anticipate knee strain. Overall reassuring exam. Supportive care reviewed. Update if not improving with treatment.       Pain, dental    Chipped teeth, gingival swelling, possible caries to left lower teeth. She is going to call dentist to schedule appointment. Given tooth pain and gingival swelling, will treat with augmentin course. Encouraged expedited dental eval.           Meds ordered this encounter  Medications  . Liraglutide -Weight Management (SAXENDA) 18 MG/3ML SOPN    Sig: Inject 0.6 mg into the skin daily for 7 days, THEN 1.2 mg daily for 7 days, THEN 1.8 mg daily for 7 days, THEN 2.4 mg daily.    Dispense:  15 mL    Refill:  0  . amoxicillin-clavulanate (AUGMENTIN) 875-125 MG tablet    Sig: Take 1 tablet by mouth 2 (two) times daily for 7 days.    Dispense:  14 tablet    Refill:  0   No orders of the defined types were placed in this encounter.  Patient Instructions  saxenda refilled - start slow taper as per instructions. augmentin refilled today for dental issue - call to schedule dentist appointment.  For right knee - likely knee strain after walking on the beach. Supportive care- elevation of leg, rest,  stretching, exercises provided today. May use ice or heating pad, may use tylenol or ibuprofen as needed. Let know if not improving as expected   Follow up plan: Return in about 6 weeks (around 12/18/2020), or if symptoms worsen or fail to improve.  12/20/2020, MD

## 2020-11-11 ENCOUNTER — Telehealth: Payer: Self-pay | Admitting: Family Medicine

## 2020-11-11 DIAGNOSIS — Z0279 Encounter for issue of other medical certificate: Secondary | ICD-10-CM

## 2020-11-11 DIAGNOSIS — M25561 Pain in right knee: Secondary | ICD-10-CM | POA: Insufficient documentation

## 2020-11-11 DIAGNOSIS — G8929 Other chronic pain: Secondary | ICD-10-CM | POA: Insufficient documentation

## 2020-11-11 DIAGNOSIS — K0889 Other specified disorders of teeth and supporting structures: Secondary | ICD-10-CM | POA: Insufficient documentation

## 2020-11-11 NOTE — Assessment & Plan Note (Signed)
Acute knee pain that started after walking on beach - anticipate knee strain. Overall reassuring exam. Supportive care reviewed. Update if not improving with treatment.

## 2020-11-11 NOTE — Telephone Encounter (Signed)
Form signed Thanks so much for your help

## 2020-11-11 NOTE — Assessment & Plan Note (Addendum)
Weight gain noted since saxenda stopped - will restart with slow taper planned to 2.4mg  daily. Nausea at 3mg  dose.

## 2020-11-11 NOTE — Assessment & Plan Note (Addendum)
Chipped teeth, gingival swelling, possible caries to left lower teeth. She is going to call dentist to schedule appointment. Given tooth pain and gingival swelling, will treat with augmentin course. Encouraged expedited dental eval.

## 2020-11-11 NOTE — Telephone Encounter (Signed)
Spoke to pt about FMLA paperwork, repeat of last year.  Placed in PCP's inbox to be signed

## 2020-11-14 ENCOUNTER — Other Ambulatory Visit: Payer: Self-pay | Admitting: Family Medicine

## 2020-11-14 DIAGNOSIS — E559 Vitamin D deficiency, unspecified: Secondary | ICD-10-CM

## 2020-11-14 DIAGNOSIS — D509 Iron deficiency anemia, unspecified: Secondary | ICD-10-CM

## 2020-11-14 DIAGNOSIS — E538 Deficiency of other specified B group vitamins: Secondary | ICD-10-CM

## 2020-11-14 NOTE — Telephone Encounter (Signed)
I spoke with Fulton Mole who finished this off  FMLA paperwork faxed   Copy made for scan  Copy for billing  Copy retained for self   Copy for pt

## 2020-12-19 ENCOUNTER — Other Ambulatory Visit (HOSPITAL_COMMUNITY): Payer: Self-pay

## 2021-02-05 ENCOUNTER — Other Ambulatory Visit: Payer: Self-pay

## 2021-02-05 MED ORDER — FLUTICASONE PROPIONATE 50 MCG/ACT NA SUSP
NASAL | 0 refills | Status: DC
  Filled 2021-02-05: qty 16, 30d supply, fill #0

## 2021-02-13 ENCOUNTER — Telehealth: Payer: Self-pay

## 2021-02-13 NOTE — Telephone Encounter (Signed)
Received faxed PA renewal from [employer] for Saxenda.  Submitted PA renewal; key:  [id number].  Decision pending.

## 2021-02-14 ENCOUNTER — Other Ambulatory Visit: Payer: Self-pay

## 2021-02-14 NOTE — Telephone Encounter (Addendum)
Received faxed PA renewal approval, valid 02/13/2021- 02/12/2022.

## 2021-02-18 ENCOUNTER — Other Ambulatory Visit: Payer: Self-pay | Admitting: Family Medicine

## 2021-02-18 ENCOUNTER — Other Ambulatory Visit: Payer: Self-pay

## 2021-02-19 NOTE — Telephone Encounter (Signed)
Saxenda Last filled:  11/06/20, #15 mL Last OV:  11/06/20, wt mgmt f/u Next OV:  none

## 2021-02-25 ENCOUNTER — Other Ambulatory Visit: Payer: Self-pay

## 2021-02-25 MED FILL — Liraglutide (Weight Mngmt) Soln Pen-Inj 18 MG/3ML (6 MG/ML): SUBCUTANEOUS | 30 days supply | Qty: 15 | Fill #0 | Status: AC

## 2021-02-25 NOTE — Telephone Encounter (Signed)
ERx Will need OV prior to more refills.

## 2021-03-02 ENCOUNTER — Encounter: Payer: Self-pay | Admitting: Family Medicine

## 2021-05-06 ENCOUNTER — Telehealth: Payer: 59 | Admitting: Family Medicine

## 2021-05-06 ENCOUNTER — Other Ambulatory Visit: Payer: Self-pay

## 2021-05-06 ENCOUNTER — Encounter: Payer: Self-pay | Admitting: Family Medicine

## 2021-05-06 ENCOUNTER — Telehealth (INDEPENDENT_AMBULATORY_CARE_PROVIDER_SITE_OTHER): Payer: 59 | Admitting: Family Medicine

## 2021-05-06 VITALS — BP 120/80 | Temp 97.6°F | Ht 66.0 in | Wt 189.0 lb

## 2021-05-06 DIAGNOSIS — T753XXA Motion sickness, initial encounter: Secondary | ICD-10-CM | POA: Diagnosis not present

## 2021-05-06 DIAGNOSIS — E669 Obesity, unspecified: Secondary | ICD-10-CM

## 2021-05-06 MED ORDER — SCOPOLAMINE 1 MG/3DAYS TD PT72
1.0000 | MEDICATED_PATCH | TRANSDERMAL | 1 refills | Status: DC
  Filled 2021-05-06: qty 4, 12d supply, fill #0

## 2021-05-06 MED ORDER — CONTRAVE 8-90 MG PO TB12
ORAL_TABLET | ORAL | 0 refills | Status: DC
  Filled 2021-05-06 – 2021-05-13 (×2): qty 120, 30d supply, fill #0
  Filled 2021-05-20: qty 70, 30d supply, fill #0

## 2021-05-06 NOTE — Telephone Encounter (Signed)
Opened in error

## 2021-05-06 NOTE — Progress Notes (Signed)
[name], female    DOB: 26-Oct-1965, 55 y.o.   MRN: [id number]  Virtual visit completed through MyChart, a video enabled telemedicine application. Due to national recommendations of social distancing due to COVID-19, a virtual visit is felt to be most appropriate for this patient at this time. Reviewed limitations, risks, security and privacy concerns of performing a virtual visit and the availability of in person appointments. I also reviewed that there may be a patient responsible charge related to this service. The patient agreed to proceed.   Patient location: home Provider location: [facility], office Persons participating in this virtual visit: patient, provider   If any vitals were documented, they were collected by patient at home unless specified below.    BP 120/80   Temp 97.6 F (36.4 C)   Ht 5\' 6"  (1.676 m)   Wt 189 lb (85.7 kg)   BMI 30.51 kg/m    CC: nausea, anxiety Subjective:   HPI: Jasmine Willis is a 55 y.o. female presenting on 05/06/2021 for Nausea (C/o nausea due Saxenda.) and Anxiety (C/o anxiety due to upcoming cruise.  Requests medication. )   Office visit converted to virtual due to confusion over location of appointment [facility] vs [facility]).   Upcoming cruise for beginning of October - requests Rx medication for motion sickness.   Starting weight: 229 lbs (02/2019) Nadir: 178 lbs Last weight: 189 lbs  Today's weight 189 lbs  On phentermine from 02/2019 to 10/2019. Saxenda started at that time, titrated up to 3mg  daily - but with resultant GI upset and nausea she stopped medication. She also noted side effect of decreased libido.   24 hour recall: Not done  Activity regimen: Not done     Relevant past medical, surgical, family and social history reviewed and updated as indicated. Interim medical history since our last visit reviewed. Allergies and medications reviewed and updated. Outpatient  Medications Prior to Visit  Medication Sig Dispense Refill   fluticasone (FLONASE) 50 MCG/ACT nasal spray 2 sprays every day until directed to stop 16 g 0   Liraglutide -Weight Management (SAXENDA) 18 MG/3ML SOPN Inject 2.4 mg into the skin daily. 12 mL 1   amoxicillin-clavulanate (AUGMENTIN) 875-125 MG tablet TAKE 1 TABLET BY MOUTH TWO TIMES DAILY FOR 7 DAYS 14 tablet 0   doxycycline (VIBRA-TABS) 100 MG tablet TAKE 1 TABLET BY MOUTH 2 (TWO) TIMES DAILY FOR 3 DAYS 6 tablet 0   misoprostol (CYTOTEC) 200 MCG tablet TAKE 1 TABLET BY MOUTH ONCE FOR 1 DOSE AT BEDTIME THE NIGHT BEFORE THE PROCEDURE 1 tablet 0   promethazine (PHENERGAN) 25 MG tablet TAKE 1 TABLET BY MOUTH EVERY 6 (SIX) HOURS AS NEEDED FOR NAUSEA;BRING TO THE OFFICE THE DAY OF THE PROCEDURE 10 tablet 0   No facility-administered medications prior to visit.     Per HPI unless specifically indicated in ROS section below Review of Systems Objective:  BP 120/80   Temp 97.6 F (36.4 C)   Ht 5\' 6"  (1.676 m)   Wt 189 lb (85.7 kg)   BMI 30.51 kg/m   Wt Readings from Last 3 Encounters:  05/06/21 189 lb (85.7 kg)  11/06/20 189 lb 2 oz (85.8 kg)  05/07/20 178 lb 5 oz (80.9 kg)       Physical exam: Gen: alert, NAD, not ill appearing Pulm: speaks in complete sentences without increased work of breathing Psych: normal mood, normal thought content  Results for orders placed or performed in visit on 05/07/20  Comprehensive metabolic panel  Result Value Ref Range   Sodium 139 135 - 145 mEq/L   Potassium 4.2 3.5 - 5.1 mEq/L   Chloride 105 96 - 112 mEq/L   CO2 27 19 - 32 mEq/L   Glucose, Bld 82 70 - 99 mg/dL   BUN 10 6 - 23 mg/dL   Creatinine, Ser 6.19 0.40 - 1.20 mg/dL   Total Bilirubin 0.4 0.2 - 1.2 mg/dL   Alkaline Phosphatase 51 39 - 117 U/L   AST 14 0 - 37 U/L   ALT 11 0 - 35 U/L   Total Protein 6.4 6.0 - 8.3 g/dL   Albumin 4.1 3.5 - 5.2 g/dL   GFR 50.93 >26.71 mL/min   Calcium 9.0 8.4 - 10.5 mg/dL  Lipid panel   Result Value Ref Range   Cholesterol 176 0 - 200 mg/dL   Triglycerides 24.5 0.0 - 149.0 mg/dL   HDL 80.99 >83.38 mg/dL   VLDL 25.0 0.0 - 53.9 mg/dL   LDL Cholesterol 767 (H) 0 - 99 mg/dL   Total CHOL/HDL Ratio 3    NonHDL 123.61   TSH  Result Value Ref Range   TSH 2.19 0.35 - 4.50 uIU/mL  CBC with Differential/Platelet  Result Value Ref Range   WBC 7.8 4.0 - 10.5 K/uL   RBC 4.24 3.87 - 5.11 Mil/uL   Hemoglobin 12.3 12.0 - 15.0 g/dL   HCT 34.1 93.7 - 90.2 %   MCV 85.6 78.0 - 100.0 fl   MCHC 34.0 30.0 - 36.0 g/dL   RDW 40.9 73.5 - 32.9 %   Platelets 276.0 150.0 - 400.0 K/uL   Neutrophils Relative % 53.7 43.0 - 77.0 %   Lymphocytes Relative 35.8 12.0 - 46.0 %   Monocytes Relative 5.9 3.0 - 12.0 %   Eosinophils Relative 3.4 0.0 - 5.0 %   Basophils Relative 1.2 0.0 - 3.0 %   Neutro Abs 4.2 1.4 - 7.7 K/uL   Lymphs Abs 2.8 0.7 - 4.0 K/uL   Monocytes Absolute 0.5 0.1 - 1.0 K/uL   Eosinophils Absolute 0.3 0.0 - 0.7 K/uL   Basophils Absolute 0.1 0.0 - 0.1 K/uL  Vitamin B12  Result Value Ref Range   Vitamin B-12 229 211 - 911 pg/mL  Ferritin  Result Value Ref Range   Ferritin 12.0 10.0 - 291.0 ng/mL  IBC panel  Result Value Ref Range   Iron 61 42 - 145 ug/dL   Transferrin 924.2 683.4 - 360.0 mg/dL   Saturation Ratios 19.6 (L) 20.0 - 50.0 %  VITAMIN D 25 Hydroxy (Vit-D Deficiency, Fractures)  Result Value Ref Range   VITD 27.68 (L) 30.00 - 100.00 ng/mL   Assessment & Plan:   Problem List Items Addressed This Visit     Obesity (BMI 30.0-34.9) - Primary    Success with phentermine, used for ~7 months. Did not tolerate saxenda due to nausea and decreased libido.  Agrees to try contrave. Reviewed mechanism of action as well as side effects to watch for. Pt will price out. RTC 4-6 wks f/u visit.  She has fallen off regular walking routine - encouraged restart this. She is motivated as has found a new walking partner.       Sea sickness    History of this - requests  medication for upcoming cruise. Will Rx scopolamine patches, discussed how to use as well as side effects to watch for including dry mouth,  blurry vision, etc.         Meds ordered this encounter  Medications   scopolamine (TRANSDERM-SCOP, 1.5 MG,) 1 MG/3DAYS    Sig: Place 1 patch (1.5 mg total) onto the skin every 3 (three) days.    Dispense:  4 patch    Refill:  1   Naltrexone-buPROPion HCl ER (CONTRAVE) 8-90 MG TB12    Sig: Start 1 tablet every morning for 7 days, then 1 tablet twice daily for 7 days, then 2 tablets every morning and one every evening, then 2 tablets twice daily    Dispense:  120 tablet    Refill:  0   No orders of the defined types were placed in this encounter.   I discussed the assessment and treatment plan with the patient. The patient was provided an opportunity to ask questions and all were answered. The patient agreed with the plan and demonstrated an understanding of the instructions. The patient was advised to call back or seek an in-person evaluation if the symptoms worsen or if the condition fails to improve as anticipated.  Follow up plan: No follow-ups on file.  Eustaquio Boyden, MD

## 2021-05-07 ENCOUNTER — Other Ambulatory Visit: Payer: Self-pay

## 2021-05-07 DIAGNOSIS — T753XXA Motion sickness, initial encounter: Secondary | ICD-10-CM | POA: Insufficient documentation

## 2021-05-07 NOTE — Assessment & Plan Note (Signed)
History of this - requests medication for upcoming cruise. Will Rx scopolamine patches, discussed how to use as well as side effects to watch for including dry mouth, blurry vision, etc.

## 2021-05-07 NOTE — Assessment & Plan Note (Addendum)
Success with phentermine, used for ~7 months. Did not tolerate saxenda due to nausea and decreased libido.  Agrees to try contrave. Reviewed mechanism of action as well as side effects to watch for. Pt will price out. RTC 4-6 wks f/u visit.  She has fallen off regular walking routine - encouraged restart this. She is motivated as has found a new walking partner.

## 2021-05-08 ENCOUNTER — Telehealth: Payer: Self-pay | Admitting: Family Medicine

## 2021-05-12 NOTE — Telephone Encounter (Signed)
Filled and in Lisa's box 

## 2021-05-12 NOTE — Telephone Encounter (Signed)
Received faxed PA form from [employer] for Contrave.  Placed form in Dr. Timoteo Expose box.

## 2021-05-13 ENCOUNTER — Other Ambulatory Visit: Payer: Self-pay

## 2021-05-13 NOTE — Telephone Encounter (Signed)
Faxed form.  Decision pending.  

## 2021-05-15 NOTE — Telephone Encounter (Signed)
Received faxed PA form requesting additional info.  Placed form in Dr. G's box.  

## 2021-05-16 NOTE — Telephone Encounter (Signed)
Faxed PA form.  Decision pending.  

## 2021-05-16 NOTE — Telephone Encounter (Signed)
Filled and in Lisa's box 

## 2021-05-19 ENCOUNTER — Other Ambulatory Visit: Payer: Self-pay

## 2021-05-19 ENCOUNTER — Telehealth: Payer: Self-pay | Admitting: Family Medicine

## 2021-05-19 NOTE — Telephone Encounter (Signed)
Pt called stating that pharmacy haven't got medication Naltrexone-buPROPion HCl ER (CONTRAVE) 8-90 MG TB12

## 2021-05-19 NOTE — Telephone Encounter (Signed)
Received faxed PA approval, valid 05/18/2021- 06/16/2021.

## 2021-05-20 ENCOUNTER — Other Ambulatory Visit: Payer: Self-pay

## 2021-05-20 NOTE — Telephone Encounter (Signed)
Spoke with Pam at [facility] notifying her of PA approval.  Says they will put it through and fill for pt.

## 2021-05-20 NOTE — Telephone Encounter (Signed)
Attempted to contact pt.  Vm box not set up.  Need to inform her the prior authorization was approved and the pharmacy is aware. (See phn note, 05/08/21)

## 2021-05-21 ENCOUNTER — Other Ambulatory Visit: Payer: Self-pay

## 2021-06-24 ENCOUNTER — Other Ambulatory Visit: Payer: Self-pay

## 2021-07-20 ENCOUNTER — Other Ambulatory Visit: Payer: Self-pay | Admitting: Family Medicine

## 2021-07-20 DIAGNOSIS — E538 Deficiency of other specified B group vitamins: Secondary | ICD-10-CM

## 2021-07-20 DIAGNOSIS — D509 Iron deficiency anemia, unspecified: Secondary | ICD-10-CM

## 2021-07-20 DIAGNOSIS — E559 Vitamin D deficiency, unspecified: Secondary | ICD-10-CM

## 2021-07-20 DIAGNOSIS — Z1159 Encounter for screening for other viral diseases: Secondary | ICD-10-CM

## 2021-07-20 DIAGNOSIS — E785 Hyperlipidemia, unspecified: Secondary | ICD-10-CM

## 2021-07-21 ENCOUNTER — Other Ambulatory Visit: Payer: 59

## 2021-07-28 ENCOUNTER — Encounter: Payer: 59 | Admitting: Family Medicine

## 2021-07-30 ENCOUNTER — Encounter: Payer: Self-pay | Admitting: Family Medicine

## 2021-07-30 ENCOUNTER — Other Ambulatory Visit: Payer: Self-pay

## 2021-07-30 ENCOUNTER — Ambulatory Visit (INDEPENDENT_AMBULATORY_CARE_PROVIDER_SITE_OTHER): Payer: 59 | Admitting: Family Medicine

## 2021-07-30 VITALS — BP 128/86 | HR 78 | Temp 97.7°F | Ht 66.0 in | Wt 206.4 lb

## 2021-07-30 DIAGNOSIS — Z1231 Encounter for screening mammogram for malignant neoplasm of breast: Secondary | ICD-10-CM | POA: Diagnosis not present

## 2021-07-30 DIAGNOSIS — Z1159 Encounter for screening for other viral diseases: Secondary | ICD-10-CM | POA: Diagnosis not present

## 2021-07-30 DIAGNOSIS — D509 Iron deficiency anemia, unspecified: Secondary | ICD-10-CM

## 2021-07-30 DIAGNOSIS — Z23 Encounter for immunization: Secondary | ICD-10-CM | POA: Diagnosis not present

## 2021-07-30 DIAGNOSIS — E669 Obesity, unspecified: Secondary | ICD-10-CM

## 2021-07-30 DIAGNOSIS — E538 Deficiency of other specified B group vitamins: Secondary | ICD-10-CM | POA: Diagnosis not present

## 2021-07-30 DIAGNOSIS — E559 Vitamin D deficiency, unspecified: Secondary | ICD-10-CM

## 2021-07-30 DIAGNOSIS — Z1211 Encounter for screening for malignant neoplasm of colon: Secondary | ICD-10-CM | POA: Diagnosis not present

## 2021-07-30 DIAGNOSIS — E785 Hyperlipidemia, unspecified: Secondary | ICD-10-CM

## 2021-07-30 DIAGNOSIS — Z0000 Encounter for general adult medical examination without abnormal findings: Secondary | ICD-10-CM

## 2021-07-30 DIAGNOSIS — M1711 Unilateral primary osteoarthritis, right knee: Secondary | ICD-10-CM

## 2021-07-30 DIAGNOSIS — F418 Other specified anxiety disorders: Secondary | ICD-10-CM

## 2021-07-30 MED ORDER — PHENTERMINE HCL 37.5 MG PO CAPS
37.5000 mg | ORAL_CAPSULE | ORAL | 0 refills | Status: DC
  Filled 2021-07-30: qty 30, 30d supply, fill #0

## 2021-07-30 MED ORDER — BUPROPION HCL ER (SR) 100 MG PO TB12
100.0000 mg | ORAL_TABLET | Freq: Every day | ORAL | 6 refills | Status: DC
  Filled 2021-07-30: qty 30, 30d supply, fill #0
  Filled 2021-09-03: qty 30, 30d supply, fill #1
  Filled 2021-10-12: qty 30, 30d supply, fill #2
  Filled 2021-11-18: qty 30, 30d supply, fill #3
  Filled 2021-12-23: qty 30, 30d supply, fill #4
  Filled 2022-02-02: qty 30, 30d supply, fill #5

## 2021-07-30 NOTE — Assessment & Plan Note (Signed)
Update levels off replacement. 

## 2021-07-30 NOTE — Assessment & Plan Note (Signed)
H/o R knee OA, previously improved with weight loss. Now with weight gain pain has returned - has been managing with meloxicam. Will work towards weight loss

## 2021-07-30 NOTE — Assessment & Plan Note (Addendum)
Anxiety > depression, largely work related. She felt significant benefit while on contrave - desires to try wellbutrin - will start SR 100mg  daily in am. She has not been able to do regular activity regimen due to knee pain.

## 2021-07-30 NOTE — Patient Instructions (Addendum)
Labs today  2nd Shingrix vaccine today  We will refer you to GI for colonoscopy.  Call to schedule screening mammogram at [facility]: [phone] Check with employee health on latest tetanus shot and let us know.  Return as needed or in 4-6 weeks for follow up weight.   Health Maintenance, Female Adopting a healthy lifestyle and getting preventive care are important in promoting health and wellness. Ask your health care provider about: The right schedule for you to have regular tests and exams. Things you can do on your own to prevent diseases and keep yourself healthy. What should I know about diet, weight, and exercise? Eat a healthy diet  Eat a diet that includes plenty of vegetables, fruits, low-fat dairy products, and lean protein. Do not eat a lot of foods that are high in solid fats, added sugars, or sodium. Maintain a healthy weight Body mass index (BMI) is used to identify weight problems. It estimates body fat based on height and weight. Your health care provider can help determine your BMI and help you achieve or maintain a healthy weight. Get regular exercise Get regular exercise. This is one of the most important things you can do for your health. Most adults should: Exercise for at least 150 minutes each week. The exercise should increase your heart rate and make you sweat (moderate-intensity exercise). Do strengthening exercises at least twice a week. This is in addition to the moderate-intensity exercise. Spend less time sitting. Even light physical activity can be beneficial. Watch cholesterol and blood lipids Have your blood tested for lipids and cholesterol at 53 years of age, then have this test every 5 years. Have your cholesterol levels checked more often if: Your lipid or cholesterol levels are high. You are older than 53 years of age. You are at high risk for heart disease. What should I know about cancer screening? Depending on your health history and family  history, you may need to have cancer screening at various ages. This may include screening for: Breast cancer. Cervical cancer. Colorectal cancer. Skin cancer. Lung cancer. What should I know about heart disease, diabetes, and high blood pressure? Blood pressure and heart disease High blood pressure causes heart disease and increases the risk of stroke. This is more likely to develop in people who have high blood pressure readings or are overweight. Have your blood pressure checked: Every 3-5 years if you are 59-57 years of age. Every year if you are 95 years old or older. Diabetes Have regular diabetes screenings. This checks your fasting blood sugar level. Have the screening done: Once every three years after age 69 if you are at a normal weight and have a low risk for diabetes. More often and at a younger age if you are overweight or have a high risk for diabetes. What should I know about preventing infection? Hepatitis B If you have a higher risk for hepatitis B, you should be screened for this virus. Talk with your health care provider to find out if you are at risk for hepatitis B infection. Hepatitis C Testing is recommended for: Everyone born from [birth year] through 1963. Anyone with known risk factors for hepatitis C. Sexually transmitted infections (STIs) Get screened for STIs, including gonorrhea and chlamydia, if: You are sexually active and are younger than 53 years of age. You are older than 53 years of age and your health care provider tells you that you are at risk for this type of infection. Your sexual activity has changed  since you were last screened, and you are at increased risk for chlamydia or gonorrhea. Ask your health care provider if you are at risk. Ask your health care provider about whether you are at high risk for HIV. Your health care provider may recommend a prescription medicine to help prevent HIV infection. If you choose to take medicine to prevent HIV, you  should first get tested for HIV. You should then be tested every 3 months for as long as you are taking the medicine. Pregnancy If you are about to stop having your period (premenopausal) and you may become pregnant, seek counseling before you get pregnant. Take 400 to 800 micrograms (mcg) of folic acid every day if you become pregnant. Ask for birth control (contraception) if you want to prevent pregnancy. Osteoporosis and menopause Osteoporosis is a disease in which the bones lose minerals and strength with aging. This can result in bone fractures. If you are 12 years old or older, or if you are at risk for osteoporosis and fractures, ask your health care provider if you should: Be screened for bone loss. Take a calcium or vitamin D supplement to lower your risk of fractures. Be given hormone replacement therapy (HRT) to treat symptoms of menopause. Follow these instructions at home: Alcohol use Do not drink alcohol if: Your health care provider tells you not to drink. You are pregnant, may be pregnant, or are planning to become pregnant. If you drink alcohol: Limit how much you have to: 0-1 drink a day. Know how much alcohol is in your drink. In the [place] one drink equals one 12 oz bottle of beer (355 mL), one 5 oz glass of wine (148 mL), or one 1 oz glass of hard liquor (44 mL). Lifestyle Do not use any products that contain nicotine or tobacco. These products include cigarettes, chewing tobacco, and vaping devices, such as e-cigarettes. If you need help quitting, ask your health care provider. Do not use street drugs. Do not share needles. Ask your health care provider for help if you need support or information about quitting drugs. General instructions Schedule regular health, dental, and eye exams. Stay current with your vaccines. Tell your health care provider if: You often feel depressed. You have ever been abused or do not feel safe at home. Summary Adopting a healthy  lifestyle and getting preventive care are important in promoting health and wellness. Follow your health care provider's instructions about healthy diet, exercising, and getting tested or screened for diseases. Follow your health care provider's instructions on monitoring your cholesterol and blood pressure. This information is not intended to replace advice given to you by your health care provider. Make sure you discuss any questions you have with your health care provider. Document Revised: 12/30/2020 Document Reviewed: 12/30/2020 Elsevier Patient Education  2022 [employer].

## 2021-07-30 NOTE — Assessment & Plan Note (Signed)
Preventative protocols reviewed and updated unless pt declined. Discussed healthy diet and lifestyle.  

## 2021-07-30 NOTE — Assessment & Plan Note (Signed)
Weight gain noted. Activity limited by knee pain. was unable to fill full contrave dose as prescribed. Desires to restart phentermine which has been most effective and tolerated to date. Aware of temporary course. RTC 4-6 wks f/u visit weight management.

## 2021-07-30 NOTE — Progress Notes (Signed)
[name], female    DOB: [birth year], 55 y.o.   MRN: [id number]  This visit was conducted in person.  BP 128/86   Pulse 78   Temp 97.7 F (36.5 C) (Temporal)   Ht _0  (1.676 m)   Wt 206 lb 7 oz (93.6 kg)   SpO2 98%   BMI 33.32 kg/m    CC: CPE Subjective:   HPI: Jasmine Willis is a 55 y.o. female presenting on 07/30/2021 for Annual Exam   Starting weight: 229 lbs (02/2019) Nadir: 178 lbs Last weight: 189 lbs  Today's weight 206 lbs  Previously on phentermine 2020-2021 then saxenda titrated up to 5m dose but with resultant GI upset and nausea and decreased libido.  Last visit 04/2021 I prescribed contrave - insurance only covered enough to take 1 tablet daily. This was not enough for weight loss.  Interested in restarting phentermine.   Notes worsening R knee pain. Taking meloxicam 7.527mdaily for this. This limits activity.   Preventative: Colon cancer screening - discussed, would like stool kit.  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  LMP - 1+ years ago  [facility]  Lung cancer screening - non smoker  [place] [employer]/[place], 09/2019, booster 05/2020 Tetanus - unsure - check with employee health on latest tetanus shot  Shingrix - 04/2020, rpt today Seat belt use discussed Sunscreen use discussed, no changing moles on skin  Non smoker Alcohol - occasional glass of wine  Dentist q6 mo  Eye exam yearly   Lives with husband and 2 children, 1 dog Edu: some college Occupation: works at [employer]: walking 2 miles 3x/wk  Diet: good water, fruits/vegetables daily     Relevant past medical, surgical, family and social history reviewed and updated as indicated. Interim medical history since our last visit reviewed. Allergies and medications reviewed and updated. Outpatient Medications Prior to Visit   Medication Sig Dispense Refill   fluticasone (FLONASE) 50 MCG/ACT nasal spray 2 sprays every day until directed to stop 16 g 0   Naltrexone-buPROPion HCl ER (CONTRAVE) 8-90 MG TB12 Start 1 tablet every morning for 7 days, then 1 tablet twice daily for 7 days, then 2 tablets every morning and one every evening for 7 days, then 2 tablets twice daily 120 tablet 0   scopolamine (TRANSDERM-SCOP, 1.5 MG,) 1 MG/3DAYS Place 1 patch (1.5 mg total) onto the skin every 3 (three) days. 4 patch 1   No facility-administered medications prior to visit.     Per HPI unless specifically indicated in ROS section below Review of Systems  Constitutional:  Negative for activity change, appetite change, chills, fatigue, fever and unexpected weight change.  HENT:  Negative for hearing loss.   Eyes:  Negative for visual disturbance.  Respiratory:  Negative for cough, chest tightness, shortness of breath and wheezing.   Cardiovascular:  Negative for chest pain, palpitations and leg swelling.  Gastrointestinal:  Negative for abdominal distention, abdominal pain, blood in stool, constipation, diarrhea, nausea and vomiting.  Genitourinary:  Negative for difficulty urinating and hematuria.  Musculoskeletal:  Negative for arthralgias, myalgias and neck pain.  Skin:  Negative for rash.  Neurological:  Negative for dizziness, seizures, syncope and headaches.  Hematological:  Negative for adenopathy. Does not bruise/bleed easily.  Psychiatric/Behavioral:  Positive for dysphoric mood. The patient is nervous/anxious.  Work related stressors   Objective:  BP 128/86   Pulse 78   Temp 97.7 F (36.5 C) (Temporal)   Ht _0  (1.676 m)   Wt 206 lb 7 oz (93.6 kg)   SpO2 98%   BMI 33.32 kg/m   Wt Readings from Last 3 Encounters:  07/30/21 206 lb 7 oz (93.6 kg)  05/06/21 189 lb (85.7 kg)  11/06/20 189 lb 2 oz (85.8 kg)      Physical Exam Vitals and nursing note reviewed.  Constitutional:      Appearance:  Normal appearance. She is not ill-appearing.  HENT:     Head: Normocephalic and atraumatic.     Right Ear: Tympanic membrane, ear canal and external ear normal. There is no impacted cerumen.     Left Ear: Tympanic membrane, ear canal and external ear normal. There is no impacted cerumen.  Eyes:     General:        Right eye: No discharge.        Left eye: No discharge.     Extraocular Movements: Extraocular movements intact.     Conjunctiva/sclera: Conjunctivae normal.     Pupils: Pupils are equal, round, and reactive to light.  Neck:     Thyroid: No thyroid mass or thyromegaly.  Cardiovascular:     Rate and Rhythm: Normal rate and regular rhythm.     Pulses: Normal pulses.     Heart sounds: Normal heart sounds. No murmur heard. Pulmonary:     Effort: Pulmonary effort is normal. No respiratory distress.     Breath sounds: Normal breath sounds. No wheezing, rhonchi or rales.  Abdominal:     General: Bowel sounds are normal. There is no distension.     Palpations: Abdomen is soft. There is no mass.     Tenderness: There is no abdominal tenderness. There is no guarding or rebound.     Hernia: No hernia is present.  Musculoskeletal:     Cervical back: Normal range of motion and neck supple. No rigidity.     Right lower leg: No edema.     Left lower leg: No edema.  Lymphadenopathy:     Cervical: No cervical adenopathy.  Skin:    General: Skin is warm and dry.     Findings: No rash.  Neurological:     General: No focal deficit present.     Mental Status: She is alert. Mental status is at baseline.  Psychiatric:        Mood and Affect: Mood normal.        Behavior: Behavior normal.      Results for orders placed or performed in visit on 05/07/20  Comprehensive metabolic panel  Result Value Ref Range   Sodium 139 135 - 145 mEq/L   Potassium 4.2 3.5 - 5.1 mEq/L   Chloride 105 96 - 112 mEq/L   CO2 27 19 - 32 mEq/L   Glucose, Bld 82 70 - 99 mg/dL   BUN 10 6 - 23 mg/dL    Creatinine, Ser 0.84 0.40 - 1.20 mg/dL   Total Bilirubin 0.4 0.2 - 1.2 mg/dL   Alkaline Phosphatase 51 39 - 117 U/L   AST 14 0 - 37 U/L   ALT 11 0 - 35 U/L   Total Protein 6.4 6.0 - 8.3 g/dL   Albumin 4.1 3.5 - 5.2 g/dL   GFR 71.11 >60.00 mL/min   Calcium 9.0 8.4 - 10.5 mg/dL  Lipid panel  Result Value Ref  Range   Cholesterol 176 0 - 200 mg/dL   Triglycerides 76.0 0.0 - 149.0 mg/dL   HDL 52.60 >39.00 mg/dL   VLDL 15.2 0.0 - 40.0 mg/dL   LDL Cholesterol 108 (H) 0 - 99 mg/dL   Total CHOL/HDL Ratio 3    NonHDL 123.61   TSH  Result Value Ref Range   TSH 2.19 0.35 - 4.50 uIU/mL  CBC with Differential/Platelet  Result Value Ref Range   WBC 7.8 4.0 - 10.5 K/uL   RBC 4.24 3.87 - 5.11 Mil/uL   Hemoglobin 12.3 12.0 - 15.0 g/dL   HCT 36.3 36.0 - 46.0 %   MCV 85.6 78.0 - 100.0 fl   MCHC 34.0 30.0 - 36.0 g/dL   RDW 13.8 11.5 - 15.5 %   Platelets 276.0 150.0 - 400.0 K/uL   Neutrophils Relative % 53.7 43.0 - 77.0 %   Lymphocytes Relative 35.8 12.0 - 46.0 %   Monocytes Relative 5.9 3.0 - 12.0 %   Eosinophils Relative 3.4 0.0 - 5.0 %   Basophils Relative 1.2 0.0 - 3.0 %   Neutro Abs 4.2 1.4 - 7.7 K/uL   Lymphs Abs 2.8 0.7 - 4.0 K/uL   Monocytes Absolute 0.5 0.1 - 1.0 K/uL   Eosinophils Absolute 0.3 0.0 - 0.7 K/uL   Basophils Absolute 0.1 0.0 - 0.1 K/uL  Vitamin B12  Result Value Ref Range   Vitamin B-12 229 211 - 911 pg/mL  Ferritin  Result Value Ref Range   Ferritin 12.0 10.0 - 291.0 ng/mL  IBC panel  Result Value Ref Range   Iron 61 42 - 145 ug/dL   Transferrin 279.0 212.0 - 360.0 mg/dL   Saturation Ratios 15.6 (L) 20.0 - 50.0 %  VITAMIN D 25 Hydroxy (Vit-D Deficiency, Fractures)  Result Value Ref Range   VITD 27.68 (L) 30.00 - 100.00 ng/mL   Depression screen [facility] 2/9 07/30/2021 05/07/2020 06/27/2019 04/17/2019 06/22/2018  Decreased Interest 1 0 0 0 1  Down, Depressed, Hopeless 0 0 0 0 1  PHQ - 2 Score 1 0 0 0 2  Altered sleeping 2 0 - - 0  Tired, decreased energy 2 0 - - 0   Change in appetite 1 0 - - 0  Feeling bad or failure about yourself  - 0 - - 3  Trouble concentrating 1 0 - - 0  Moving slowly or fidgety/restless 1 0 - - 0  Suicidal thoughts 0 0 - - 0  PHQ-9 Score 8 0 - - 5    GAD 7 : Generalized Anxiety Score 07/30/2021 05/07/2020 06/22/2018  Nervous, Anxious, on Edge 3 0 1  Control/stop worrying 1 0 1  Worry too much - different things 1 0 0  Trouble relaxing 1 0 0  Restless 1 0 1  Easily annoyed or irritable 1 0 0  Afraid - awful might happen 0 0 0  Total GAD 7 Score 8 0 3   Assessment & Plan:  This visit occurred during the SARS-CoV-2 public health emergency.  Safety protocols were in place, including screening questions prior to the visit, additional usage of staff PPE, and extensive cleaning of exam room while observing appropriate contact time as indicated for disinfecting solutions.   Problem List Items Addressed This Visit     Health maintenance examination - Primary (Chronic)    Preventative protocols reviewed and updated unless pt declined. Discussed healthy diet and lifestyle.       Obesity (BMI 30.0-34.9)    Weight  gain noted. Activity limited by knee pain. was unable to fill full contrave dose as prescribed. Desires to restart phentermine which has been most effective and tolerated to date. Aware of temporary course. RTC 4-6 wks f/u visit weight management.       Depression with anxiety    Anxiety > depression, largely work related. She felt significant benefit while on contrave - desires to try wellbutrin - will start SR 178m daily in am. She has not been able to do regular activity regimen due to knee pain.       Relevant Medications   buPROPion ER (WELLBUTRIN SR) 100 MG 12 hr tablet   Primary osteoarthritis of right knee    H/o R knee OA, previously improved with weight loss. Now with weight gain pain has returned - has been managing with meloxicam. Will work towards weight loss      IDA (iron deficiency anemia)    Update  labs. Not on iron replacement.       Low vitamin B12 level    Update levels off replacement.       Vitamin D deficiency    Update levels off replacement.      Other Visit Diagnoses     Breast cancer screening by mammogram       Relevant Orders   MM 3D SCREEN BREAST BILATERAL   Special screening for malignant neoplasms, colon       Relevant Orders   Ambulatory referral to Gastroenterology   Need for hepatitis C screening test       Hyperlipidemia, unspecified hyperlipidemia type       Need for shingles vaccine       Relevant Orders   Varicella-zoster vaccine IM (Completed)        Meds ordered this encounter  Medications   phentermine 37.5 MG capsule    Sig: Take 1 capsule (37.5 mg total) by mouth every morning.    Dispense:  30 capsule    Refill:  0   buPROPion ER (WELLBUTRIN SR) 100 MG 12 hr tablet    Sig: Take 1 tablet (100 mg total) by mouth daily.    Dispense:  30 tablet    Refill:  6   Orders Placed This Encounter  Procedures   MM 3D SCREEN BREAST BILATERAL    Standing Status:   Future    Standing Expiration Date:   07/30/2022    Order Specific Question:   Reason for Exam (SYMPTOM  OR DIAGNOSIS REQUIRED)    Answer:   breast cancer screening    Order Specific Question:   Preferred imaging location?    Answer:   [facility]    Order Specific Question:   Is the patient pregnant?    Answer:   No   Varicella-zoster vaccine IM   Ambulatory referral to Gastroenterology    Referral Priority:   Routine    Referral Type:   Consultation    Referral Reason:   Specialty Services Required    Number of Visits Requested:   1     Patient instructions: Labs today  2nd Shingrix vaccine today  We will refer you to GI for colonoscopy.  Call to schedule screening mammogram at [id number] [phone] with employee health on latest tetanus shot and [place].  Return as needed or in 4-6 weeks for follow up weight.   Follow up plan: Return in about 4  weeks (around 08/27/2021) for follow up visit.  JRia Bush MD

## 2021-07-30 NOTE — Assessment & Plan Note (Signed)
Update labs. Not on iron replacement.

## 2021-07-31 ENCOUNTER — Encounter: Payer: Self-pay | Admitting: *Deleted

## 2021-07-31 LAB — CBC WITH DIFFERENTIAL/PLATELET
Basophils Absolute: 0.1 10*3/uL (ref 0.0–0.1)
Basophils Relative: 1.2 % (ref 0.0–3.0)
Eosinophils Absolute: 0.2 10*3/uL (ref 0.0–0.7)
Eosinophils Relative: 2.8 % (ref 0.0–5.0)
HCT: 40.1 % (ref 36.0–46.0)
Hemoglobin: 13.7 g/dL (ref 12.0–15.0)
Lymphocytes Relative: 30.5 % (ref 12.0–46.0)
Lymphs Abs: 2.2 10*3/uL (ref 0.7–4.0)
MCHC: 34.1 g/dL (ref 30.0–36.0)
MCV: 85.1 fl (ref 78.0–100.0)
Monocytes Absolute: 0.4 10*3/uL (ref 0.1–1.0)
Monocytes Relative: 6 % (ref 3.0–12.0)
Neutro Abs: 4.3 10*3/uL (ref 1.4–7.7)
Neutrophils Relative %: 59.5 % (ref 43.0–77.0)
Platelets: 313 10*3/uL (ref 150.0–400.0)
RBC: 4.71 Mil/uL (ref 3.87–5.11)
RDW: 13.8 % (ref 11.5–15.5)
WBC: 7.3 10*3/uL (ref 4.0–10.5)

## 2021-07-31 LAB — BASIC METABOLIC PANEL
BUN: 11 mg/dL (ref 6–23)
CO2: 27 mEq/L (ref 19–32)
Calcium: 9.9 mg/dL (ref 8.4–10.5)
Chloride: 104 mEq/L (ref 96–112)
Creatinine, Ser: 0.86 mg/dL (ref 0.40–1.20)
GFR: 77.05 mL/min (ref >60.00)
Glucose, Bld: 93 mg/dL (ref 70–99)
Potassium: 4.9 mEq/L (ref 3.5–5.1)
Sodium: 138 mEq/L (ref 135–145)

## 2021-07-31 LAB — HEPATITIS C ANTIBODY
Hepatitis C Ab: NONREACTIVE
SIGNAL TO CUT-OFF: 0.05 (ref <1.00)

## 2021-07-31 LAB — VITAMIN D 25 HYDROXY (VIT D DEFICIENCY, FRACTURES): VITD: 23.58 ng/mL — ABNORMAL LOW (ref 30.00–100.00)

## 2021-07-31 LAB — IBC PANEL
Iron: 77 ug/dL (ref 42–145)
Saturation Ratios: 21.1 % (ref 20.0–50.0)
TIBC: 365.4 ug/dL (ref 250.0–450.0)
Transferrin: 261 mg/dL (ref 212.0–360.0)

## 2021-07-31 LAB — LIPID PANEL
Cholesterol: 174 mg/dL (ref 0–200)
HDL: 58.6 mg/dL (ref >39.00)
LDL Cholesterol: 98 mg/dL (ref 0–99)
NonHDL: 115.01
Total CHOL/HDL Ratio: 3
Triglycerides: 84 mg/dL (ref 0.0–149.0)
VLDL: 16.8 mg/dL (ref 0.0–40.0)

## 2021-07-31 LAB — FERRITIN: Ferritin: 26.4 ng/mL (ref 10.0–291.0)

## 2021-07-31 LAB — VITAMIN B12: Vitamin B-12: 262 pg/mL (ref 211–911)

## 2021-08-04 ENCOUNTER — Other Ambulatory Visit: Payer: Self-pay | Admitting: Family Medicine

## 2021-08-04 MED ORDER — B-12 1000 MCG SL SUBL: 1.0000 | SUBLINGUAL_TABLET | Freq: Every day | SUBLINGUAL | Status: DC

## 2021-08-04 MED ORDER — VITAMIN D3 25 MCG (1000 UT) PO CAPS: 1.0000 | ORAL_CAPSULE | Freq: Every day | ORAL | Status: DC

## 2021-08-07 ENCOUNTER — Encounter: Payer: Self-pay | Admitting: Family Medicine

## 2021-08-07 ENCOUNTER — Telehealth: Payer: Self-pay

## 2021-08-07 DIAGNOSIS — M1711 Unilateral primary osteoarthritis, right knee: Secondary | ICD-10-CM

## 2021-08-07 NOTE — Telephone Encounter (Signed)
CALLED PATIENT NO ANSWER LEFT VOICEMAIL FOR A CALL BACK ? ?

## 2021-08-08 NOTE — Telephone Encounter (Signed)
Meloxicam Last rx:  03/10/19, #30 Last OV:  07/30/21, CPE Next OV:  09/12/21, CPE

## 2021-08-12 ENCOUNTER — Other Ambulatory Visit: Payer: Self-pay

## 2021-08-12 MED ORDER — MELOXICAM 15 MG PO TABS
15.0000 mg | ORAL_TABLET | Freq: Every day | ORAL | 1 refills | Status: DC | PRN
  Filled 2021-08-12: qty 30, 30d supply, fill #0

## 2021-08-12 NOTE — Telephone Encounter (Signed)
ER

## 2021-09-03 ENCOUNTER — Other Ambulatory Visit: Payer: Self-pay

## 2021-09-12 ENCOUNTER — Other Ambulatory Visit: Payer: Self-pay

## 2021-09-12 ENCOUNTER — Encounter: Payer: Self-pay | Admitting: Family Medicine

## 2021-09-12 ENCOUNTER — Ambulatory Visit: Payer: 59 | Admitting: Family Medicine

## 2021-09-12 VITALS — BP 126/84 | HR 79 | Temp 97.6°F | Ht 66.0 in | Wt 206.6 lb

## 2021-09-12 DIAGNOSIS — E669 Obesity, unspecified: Secondary | ICD-10-CM

## 2021-09-12 DIAGNOSIS — G8929 Other chronic pain: Secondary | ICD-10-CM | POA: Diagnosis not present

## 2021-09-12 DIAGNOSIS — M25561 Pain in right knee: Secondary | ICD-10-CM | POA: Diagnosis not present

## 2021-09-12 DIAGNOSIS — F418 Other specified anxiety disorders: Secondary | ICD-10-CM

## 2021-09-12 DIAGNOSIS — M1711 Unilateral primary osteoarthritis, right knee: Secondary | ICD-10-CM

## 2021-09-12 MED ORDER — PHENTERMINE HCL 37.5 MG PO CAPS
37.5000 mg | ORAL_CAPSULE | ORAL | 1 refills | Status: DC
  Filled 2021-09-12: qty 30, 30d supply, fill #0
  Filled 2021-12-30: qty 30, 30d supply, fill #1

## 2021-09-12 NOTE — Assessment & Plan Note (Signed)
Off and on, recently worsening despite daily meloxicam.  Known osteoarthritis, however exam today suspicious for patellofemoral pain syndrome. May continue PRN meloxicam, supportive measures reviewed including ice, rest, elevation, and provided with exercises from [facility] pt advisor.  Update if ongoing symptoms for outpatient PT referral.

## 2021-09-12 NOTE — Patient Instructions (Signed)
Continue phentermine.  You have some arthritis of right knee but I also think you have patellofemoral pain - do exercises provided, continue ice to knee, continue meloxicam, if exercises don't help, let us know for physical therapy course.

## 2021-09-12 NOTE — Assessment & Plan Note (Addendum)
Known OA from xrays 2017.  Ongoing knee pain - see below.

## 2021-09-12 NOTE — Progress Notes (Signed)
[name], female    DOB: [birth year], 56 y.o.   MRN: [id number]  This visit was conducted in person.  BP 126/84    Pulse 79    Temp 97.6 F (36.4 C) (Temporal)    Ht 5\' 6"  (1.676 m)    Wt 206 lb 9 oz (93.7 kg)    SpO2 96%    BMI 33.34 kg/m    CC: 6 wk weight management f/u visit  Subjective:   HPI: Jasmine Willis is a 56 y.o. female presenting on 09/12/2021 for Weight Management (Here for 6 wk f/u. )   Starting weight: 229 lbs Last weight: 206 lbs Today's weight 206 lbs  Previously on phentermine 2020-2021 then saxenda titrated up to 3mg  dose but with resultant GI upset and nausea and decreased libido.  04/2021 I prescribed contrave - insurance only covered enough to take 1 tablet daily. This was not enough for weight loss.   Phentermine restarted 07/2021 - tolerating well without headache, insomnia. Ran out 1-2 wks ago.   24 hour recall: Not done  Activity regimen: Limited by R knee pain. She is taking meloxicam for knee with benefit.   Chronic anterior R knee pain   She started wellbutrin SR 100mg  daily - with benefit.      Relevant past medical, surgical, family and social history reviewed and updated as indicated. Interim medical history since our last visit reviewed. Allergies and medications reviewed and updated. Outpatient Medications Prior to Visit  Medication Sig Dispense Refill   buPROPion ER (WELLBUTRIN SR) 100 MG 12 hr tablet Take 1 tablet (100 mg total) by mouth daily. 30 tablet 6   Cholecalciferol (VITAMIN D3) 25 MCG (1000 UT) CAPS Take 1 capsule (1,000 Units total) by mouth daily. 30 capsule    Cyanocobalamin (B-12) 1000 MCG SUBL Place 1 tablet under the tongue daily.     meloxicam (MOBIC) 15 MG tablet Take 1 tablet (15 mg total) by mouth daily as needed for pain. 30 tablet 1   phentermine 37.5 MG capsule Take 1 capsule (37.5 mg total) by mouth every morning. 30 capsule 0   No facility-administered medications prior to visit.      Per HPI unless specifically indicated in ROS section below Review of Systems  Objective:  BP 126/84    Pulse 79    Temp 97.6 F (36.4 C) (Temporal)    Ht 5\' 6"  (1.676 m)    Wt 206 lb 9 oz (93.7 kg)    SpO2 96%    BMI 33.34 kg/m   Wt Readings from Last 3 Encounters:  09/12/21 206 lb 9 oz (93.7 kg)  07/30/21 206 lb 7 oz (93.6 kg)  05/06/21 189 lb (85.7 kg)      Physical Exam Vitals and nursing note reviewed.  Constitutional:      Appearance: Normal appearance. She is obese. She is not ill-appearing.  Cardiovascular:     Rate and Rhythm: Normal rate and regular rhythm.     Pulses: Normal pulses.     Heart sounds: Normal heart sounds. No murmur heard. Pulmonary:     Effort: Pulmonary effort is normal. No respiratory distress.     Breath sounds: Normal breath sounds. No wheezing, rhonchi or rales.  Musculoskeletal:        General: Swelling and tenderness present.     Right lower leg: No edema.     Left lower leg: No edema.     Comments:  L knee WNL  R knee exam: No deformity on inspection. Discomfort to palpation of anterior knee.  Anterior knee effusion/swelling present  FROM in flex/extension with some crepitus. No popliteal fullness. Neg drawer test. Neg mcmurray test. No pain with valgus/varus stress. + PFgrind. Discomfort with patellar mobility   Skin:    General: Skin is warm and dry.     Findings: No rash.  Neurological:     Mental Status: She is alert.  Psychiatric:        Mood and Affect: Mood normal.        Behavior: Behavior normal.       Assessment & Plan:  This visit occurred during the SARS-CoV-2 public health emergency.  Safety protocols were in place, including screening questions prior to the visit, additional usage of staff PPE, and extensive cleaning of exam room while observing appropriate contact time as indicated for disinfecting solutions.   Problem List Items Addressed This Visit     Obesity (BMI 30.0-34.9)    Tolerating phentermine well  - will refill.  Encouraged ongoing healthy diet choices. Activity limited by knee pain as per below.  RTC 2 mo weight management f/u visit       Depression with anxiety    Significant improvement noted since starting wellbutrin SR 100mg  daily - continue.       Primary osteoarthritis of right knee    Known OA from xrays 2017.  Ongoing knee pain - see below.       Chronic pain of right knee - Primary    Off and on, recently worsening despite daily meloxicam.  Known osteoarthritis, however exam today suspicious for patellofemoral pain syndrome. May continue PRN meloxicam, supportive measures reviewed including ice, rest, elevation, and provided with exercises from [facility] pt advisor.  Update if ongoing symptoms for outpatient PT referral.         Meds ordered this encounter  Medications   phentermine 37.5 MG capsule    Sig: Take 1 capsule (37.5 mg total) by mouth every morning.    Dispense:  30 capsule    Refill:  1   No orders of the defined types were placed in this encounter.    Patient Instructions  Continue phentermine.  You have some arthritis of right knee but I also think you have patellofemoral pain - do exercises provided, continue ice to knee, continue meloxicam, if exercises don't help, let [facility] know for physical therapy course.   Follow up plan: Return in about 2 months (around 11/10/2021), or if symptoms worsen or fail to improve, for follow up visit.  11/12/2021, MD

## 2021-09-12 NOTE — Assessment & Plan Note (Signed)
Tolerating phentermine well - will refill.  Encouraged ongoing healthy diet choices. Activity limited by knee pain as per below.  RTC 2 mo weight management f/u visit

## 2021-09-12 NOTE — Assessment & Plan Note (Signed)
Significant improvement noted since starting wellbutrin SR 100mg  daily - continue.

## 2021-10-06 ENCOUNTER — Other Ambulatory Visit: Payer: Self-pay

## 2021-10-13 ENCOUNTER — Other Ambulatory Visit: Payer: Self-pay

## 2021-10-24 ENCOUNTER — Other Ambulatory Visit: Payer: Self-pay

## 2021-10-27 ENCOUNTER — Other Ambulatory Visit: Payer: Self-pay

## 2021-10-27 MED ORDER — TRIAZOLAM 0.25 MG PO TABS
ORAL_TABLET | ORAL | 0 refills | Status: DC
  Filled 2021-10-27: qty 2, 1d supply, fill #0

## 2021-10-28 ENCOUNTER — Encounter: Payer: Self-pay | Admitting: Family Medicine

## 2021-10-28 ENCOUNTER — Other Ambulatory Visit: Payer: Self-pay

## 2021-10-28 DIAGNOSIS — M1711 Unilateral primary osteoarthritis, right knee: Secondary | ICD-10-CM

## 2021-10-28 DIAGNOSIS — G8929 Other chronic pain: Secondary | ICD-10-CM

## 2021-10-28 DIAGNOSIS — M13861 Other specified arthritis, right knee: Secondary | ICD-10-CM | POA: Diagnosis not present

## 2021-10-28 DIAGNOSIS — M25561 Pain in right knee: Secondary | ICD-10-CM | POA: Diagnosis not present

## 2021-10-28 MED ORDER — DICLOFENAC SODIUM 75 MG PO TBEC
DELAYED_RELEASE_TABLET | ORAL | 0 refills | Status: DC
  Filled 2021-10-28: qty 30, 15d supply, fill #0

## 2021-10-28 NOTE — Telephone Encounter (Signed)
Per med list Bernadene Person D,CPht sent halcion 0.25 mg # 2 tab with instructions to take 2 tabs 1.5 hrs prior to dental appt.  ?

## 2021-11-06 ENCOUNTER — Other Ambulatory Visit: Payer: Self-pay

## 2021-11-06 MED ORDER — TRAMADOL HCL 50 MG PO TABS
ORAL_TABLET | ORAL | 0 refills | Status: DC
  Filled 2021-11-06: qty 15, 2d supply, fill #0

## 2021-11-12 NOTE — Addendum Note (Signed)
Addended by: Eustaquio Boyden on: 11/12/2021 07:44 AM ? ? Modules accepted: Orders ? ?

## 2021-11-17 ENCOUNTER — Other Ambulatory Visit: Payer: Self-pay

## 2021-11-17 MED ORDER — DICLOFENAC SODIUM 75 MG PO TBEC
DELAYED_RELEASE_TABLET | ORAL | 0 refills | Status: DC
  Filled 2021-11-17: qty 30, 15d supply, fill #0

## 2021-11-18 ENCOUNTER — Other Ambulatory Visit: Payer: Self-pay

## 2021-11-21 ENCOUNTER — Other Ambulatory Visit: Payer: Self-pay

## 2021-11-21 ENCOUNTER — Ambulatory Visit: Payer: 59 | Admitting: Family Medicine

## 2021-11-21 ENCOUNTER — Encounter: Payer: Self-pay | Admitting: Family Medicine

## 2021-11-21 MED ORDER — LIDOCAINE VISCOUS HCL 2 % MT SOLN
OROMUCOSAL | 1 refills | Status: DC
  Filled 2021-11-21: qty 140, 5d supply, fill #0

## 2021-11-21 NOTE — Telephone Encounter (Signed)
[facility] Night - Client ?Nonclinical Telephone Record  ?AccessNurse? ?Client [facility] Night - Client ?Client Site [facility] - Night ?Provider Eustaquio Boyden - MD ?Contact Type Call ?Who Is Calling Patient / Member / Family / Caregiver ?Caller Name Meghann Landing ?Caller Phone Number [phone] ?Patient Name Jasmine Willis ?Patient DOB 02-23-1966 ?Call Type Message Only Information Provided ?Reason for Call Request for General Office Information ?Initial Comment Caller wanted to send message to her Dr. ?Additional Comment Second ph # [phone] just until 3pm only. ?Disp. Time Disposition Final User ?11/21/2021 7:29:40 AM General Information Provided Yes Morton, Zochil ?Call Closed By: Eliott Nine ?Transaction Date/Time: 11/21/2021 7:26:29 AM (ET ?

## 2021-11-21 NOTE — Telephone Encounter (Signed)
I spoke with pt and she said about 2 wks ago had some teeth removed and gas was used at that procedure. Irritation at bottom of throat and there is a gaggy feeling.no distress,no throat swelling and no difficulty swallowing and no difficulty breathing. Pt said more of an annoyance. Pt had old lidocaine and it helped for short time. No CP or SOB. Pt scheduled appt with DR Diona Browner this afternoon when gets off work; pt then decided to cancel appt and she was going to try to contact ENT to see if she could see ENT today. Offered pt option could leave appt at [facility] and then if could get ENT appt pt could cb and cancel. Pt said no to go ahead and cancel appt now because does not want to go thru our phone system to cancel appt. Pt works at [employer] [facility]. Pt said she would cb if needed. UC & ED precautons given and pt voiced understanding.Sending note to DR Baldwin Crown CMA.  ?

## 2021-11-25 ENCOUNTER — Encounter: Payer: Self-pay | Admitting: Orthopaedic Surgery

## 2021-11-25 ENCOUNTER — Ambulatory Visit (INDEPENDENT_AMBULATORY_CARE_PROVIDER_SITE_OTHER): Payer: 59

## 2021-11-25 ENCOUNTER — Ambulatory Visit: Payer: 59 | Admitting: Orthopaedic Surgery

## 2021-11-25 VITALS — Ht 68.0 in | Wt 210.0 lb

## 2021-11-25 DIAGNOSIS — M25561 Pain in right knee: Secondary | ICD-10-CM

## 2021-11-25 DIAGNOSIS — G8929 Other chronic pain: Secondary | ICD-10-CM | POA: Diagnosis not present

## 2021-11-25 DIAGNOSIS — M1711 Unilateral primary osteoarthritis, right knee: Secondary | ICD-10-CM

## 2021-11-25 MED ORDER — LIDOCAINE HCL 1 % IJ SOLN
2.0000 mL | INTRAMUSCULAR | Status: AC | PRN
  Administered 2021-11-25: 2 mL

## 2021-11-25 MED ORDER — BUPIVACAINE HCL 0.5 % IJ SOLN
2.0000 mL | INTRAMUSCULAR | Status: AC | PRN
  Administered 2021-11-25: 2 mL via INTRA_ARTICULAR

## 2021-11-25 MED ORDER — METHYLPREDNISOLONE ACETATE 40 MG/ML IJ SUSP
40.0000 mg | INTRAMUSCULAR | Status: AC | PRN
  Administered 2021-11-25: 40 mg via INTRA_ARTICULAR

## 2021-11-25 NOTE — Progress Notes (Signed)
? ?Office Visit Note ?  ?Patient: Jasmine Willis           ?Date of Birth: 12-27-1965           ?MRN: [id number] ?Visit Date: 11/25/2021 ?             ?Requested by: Eustaquio Boyden, MD ?[address] [place] ?[place],  [place] [address] ?PCP: Eustaquio Boyden, MD ? ? ?Assessment & Plan: ?Visit Diagnoses:  ?1. Primary osteoarthritis of right knee   ? ? ?Plan: Impression is right knee degenerative joint disease and effusion.  The medial compartment is bone-on-bone.  She has had significant radiographic progression of her DJD in the last 6 to 7 years.  Based on treatment options we performed aspiration and injection today.  15 cc of synovial fluid obtained.  This will be sent to the lab.  Information was given on Visco supplementation.  Questions encouraged and answered. ? ?Follow-Up Instructions: No follow-ups on file.  ? ?Orders:  ?Orders Placed This Encounter  ?Procedures  ? Large Joint Inj  ? XR KNEE 3 VIEW RIGHT  ? Synovial Fluid Analysis, Complete  ? ?No orders of the defined types were placed in this encounter. ? ? ? ? Procedures: ?Large Joint Inj: R knee on 11/25/2021 5:26 PM ?Indications: pain ?Details: 22 G needle ? ?Arthrogram: No ? ?Medications: 40 mg methylPREDNISolone acetate 40 MG/ML; 2 mL lidocaine 1 %; 2 mL bupivacaine 0.5 % ?Consent was given by the patient. Patient was prepped and draped in the usual sterile fashion.  ? ? ? ? ?Clinical Data: ?No additional findings. ? ? ?Subjective: ?Chief Complaint  ?Patient presents with  ? Right Knee - Pain  ? ? ?HPI ? 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  Has taken meloxicam for years but no longer very effective.  She is having trouble with all daily activities most notably walking and standing.  She is limping all the time.  The knee feels swollen.  Denies any previous surgeries or injuries to the right knee.   Has pain throughout the knee and has trouble bending it. ? ?Review of Systems  ?Constitutional: Negative.   ?HENT: Negative.    ?Eyes: Negative.   ?Respiratory: Negative.    ?Cardiovascular: Negative.   ?Endocrine: Negative.   ?Musculoskeletal: Negative.   ?Neurological: Negative.   ?Hematological: Negative.   ?Psychiatric/Behavioral: Negative.    ?All other systems reviewed and are negative. ? ? ?Objective: ?Vital Signs: Ht 5\' 8"  (1.727 m)   Wt 210 lb (95.3 kg)   BMI 31.93 kg/m?  ? ?Physical Exam ?Vitals and nursing note reviewed.  ?Constitutional:   ?   Appearance: She is well-developed.  ?HENT:  ?   Head: Normocephalic and atraumatic.  ?Pulmonary:  ?   Effort: Pulmonary effort is normal.  ?Abdominal:  ?   Palpations: Abdomen is soft.  ?Musculoskeletal:  ?   Cervical back: Neck supple.  ?Skin: ?   General: Skin is warm.  ?   Capillary Refill: Capillary refill takes less than 2 seconds.  ?Neurological:  ?   Mental Status: She is alert and oriented to person, place, and time.  ?Psychiatric:     ?   Behavior: Behavior normal.     ?   Thought Content: Thought content normal.     ?   Judgment: Judgment normal.  ? ? ?  Ortho Exam ? ?Examination right knee shows a small joint effusion.  Unable to fully extend the knee due to pain and guarding.  Collaterals and cruciates are stable.  Pain with flexion of the knee past 90 degrees. ? ?Specialty Comments:  ?No specialty comments available. ? ?Imaging: ?XR KNEE 3 VIEW RIGHT ? ?Result Date: 11/25/2021 ?Bone-on-bone medial compartment joint space narrowing.  ? ? ?PMFS History: ?Patient Active Problem List  ? Diagnosis Date Noted  ? Sea sickness 05/07/2021  ? Chronic pain of right knee 11/11/2020  ? Pain, dental 11/11/2020  ? Low vitamin B12 level 03/10/2019  ? Vitamin D deficiency 03/10/2019  ? Fatigue 06/23/2018  ? Primary osteoarthritis of right knee 06/23/2018  ? IDA (iron deficiency anemia) 06/23/2018  ? Health maintenance examination 08/09/2015  ? Obesity (BMI 30.0-34.9)  08/09/2015  ? Depression with anxiety   ? ?Past Medical History:  ?Diagnosis Date  ? Anxiety   ?  ?Family History  ?Problem Relation Age of Onset  ? Diabetes Maternal Grandfather   ? Cancer Paternal Grandmother   ?     liver?  ? Heart disease Father   ?     pacemaker  ? Parkinson's disease Father   ? CAD Neg Hx   ? Stroke Neg Hx   ?  ?Past Surgical History:  ?Procedure Laterality Date  ? NO PAST SURGERIES    ? ?Social History  ? ?Occupational History  ? Not on file  ?Tobacco Use  ? Smoking status: Never  ? Smokeless tobacco: Never  ?Substance and Sexual Activity  ? Alcohol use: Yes  ?  Alcohol/week: 0.0 standard drinks  ? Drug use: No  ? Sexual activity: Not on file  ? ? ? ? ? ? ?

## 2021-11-26 LAB — SYNOVIAL FLUID ANALYSIS, COMPLETE
Basophils, %: 0 %
Eosinophils-Synovial: 0 % (ref 0–2)
Lymphocytes-Synovial Fld: 50 % (ref 0–74)
Monocyte/Macrophage: 44 % (ref 0–69)
Neutrophil, Synovial: 5 % (ref 0–24)
Synoviocytes, %: 1 % (ref 0–15)
WBC, Synovial: 202 cells/uL — ABNORMAL HIGH (ref <150)

## 2021-11-26 LAB — TIQ-NTM

## 2021-12-08 ENCOUNTER — Other Ambulatory Visit: Payer: Self-pay | Admitting: Family Medicine

## 2021-12-08 ENCOUNTER — Other Ambulatory Visit: Payer: Self-pay

## 2021-12-09 ENCOUNTER — Other Ambulatory Visit: Payer: Self-pay

## 2021-12-12 ENCOUNTER — Encounter: Payer: Self-pay | Admitting: Family Medicine

## 2021-12-23 ENCOUNTER — Other Ambulatory Visit: Payer: Self-pay

## 2021-12-23 MED ORDER — DICLOFENAC SODIUM 75 MG PO TBEC
DELAYED_RELEASE_TABLET | ORAL | 0 refills | Status: DC
  Filled 2021-12-23: qty 30, 15d supply, fill #0

## 2021-12-30 ENCOUNTER — Other Ambulatory Visit: Payer: Self-pay

## 2022-01-02 ENCOUNTER — Telehealth: Payer: Self-pay | Admitting: Orthopaedic Surgery

## 2022-01-02 NOTE — Telephone Encounter (Signed)
Can the pt have another  injection? ?

## 2022-01-05 NOTE — Telephone Encounter (Signed)
Tried to call patient. No answer. No voicemail.  ?Last cortisone injection was in April. Typically they can be given 3 months apart from the last.  ?

## 2022-01-06 NOTE — Telephone Encounter (Signed)
Tried to call patient again. Again, no answer. No voicemail.  ?

## 2022-01-07 NOTE — Telephone Encounter (Signed)
Tried again today. No answer. No voicemail.  ?

## 2022-01-28 ENCOUNTER — Other Ambulatory Visit: Payer: Self-pay

## 2022-02-02 ENCOUNTER — Other Ambulatory Visit: Payer: Self-pay | Admitting: Family Medicine

## 2022-02-02 ENCOUNTER — Other Ambulatory Visit: Payer: Self-pay

## 2022-02-02 MED ORDER — DICLOFENAC SODIUM 75 MG PO TBEC
DELAYED_RELEASE_TABLET | ORAL | 0 refills | Status: DC
  Filled 2022-02-02: qty 30, 15d supply, fill #0

## 2022-02-02 NOTE — Telephone Encounter (Signed)
Refill request Phentermine Last refill 09/12/21 #30/1 Last office visit 09/12/21

## 2022-02-05 ENCOUNTER — Other Ambulatory Visit: Payer: Self-pay

## 2022-02-05 NOTE — Telephone Encounter (Signed)
Denied. Last seen 08/2021. Due for f/u weight loss visit. Sent [employer].

## 2022-02-06 ENCOUNTER — Other Ambulatory Visit: Payer: Self-pay

## 2022-02-25 ENCOUNTER — Encounter: Payer: Self-pay | Admitting: Family Medicine

## 2022-02-25 ENCOUNTER — Other Ambulatory Visit: Payer: Self-pay

## 2022-02-25 ENCOUNTER — Ambulatory Visit: Payer: 59 | Admitting: Family Medicine

## 2022-02-25 DIAGNOSIS — M79672 Pain in left foot: Secondary | ICD-10-CM

## 2022-02-25 DIAGNOSIS — M1711 Unilateral primary osteoarthritis, right knee: Secondary | ICD-10-CM

## 2022-02-25 DIAGNOSIS — F418 Other specified anxiety disorders: Secondary | ICD-10-CM

## 2022-02-25 DIAGNOSIS — E66811 Obesity, class 1: Secondary | ICD-10-CM

## 2022-02-25 DIAGNOSIS — E669 Obesity, unspecified: Secondary | ICD-10-CM

## 2022-02-25 MED ORDER — BUPROPION HCL ER (SR) 100 MG PO TB12
100.0000 mg | ORAL_TABLET | Freq: Every day | ORAL | 6 refills | Status: DC
  Filled 2022-02-25: qty 30, 30d supply, fill #0
  Filled 2022-05-11: qty 30, 30d supply, fill #1
  Filled 2022-06-01: qty 30, 30d supply, fill #2
  Filled 2022-07-05: qty 30, 30d supply, fill #3

## 2022-02-25 MED ORDER — PHENTERMINE HCL 30 MG PO CAPS
30.0000 mg | ORAL_CAPSULE | ORAL | 0 refills | Status: DC
  Filled 2022-02-25: qty 30, 30d supply, fill #0

## 2022-02-25 MED ORDER — DICLOFENAC SODIUM 75 MG PO TBEC
75.0000 mg | DELAYED_RELEASE_TABLET | Freq: Every day | ORAL | 1 refills | Status: DC
  Filled 2022-02-25: qty 30, 30d supply, fill #0
  Filled 2022-03-22: qty 30, 30d supply, fill #1

## 2022-02-25 MED ORDER — ACETAMINOPHEN 500 MG PO TABS: 1000.0000 mg | ORAL_TABLET | Freq: Every day | ORAL | Status: DC

## 2022-02-25 MED ORDER — TRAMADOL HCL 50 MG PO TABS
50.0000 mg | ORAL_TABLET | Freq: Two times a day (BID) | ORAL | 0 refills | Status: AC | PRN
  Filled 2022-02-25: qty 15, 8d supply, fill #0

## 2022-02-25 NOTE — Assessment & Plan Note (Addendum)
Recent Ortho evaluation with an x-ray showing marked progression of knee osteoarthritis especially in medial compartment.  She is seen Ortho who did not recommend surgical intervention at this time, however  this is significantly limiting her activity levels including functioning at work.  Did suggest she seek second surgical opinion. Did not have a great response to steroid injection and states she actually did not tolerate well. For now, continue Tylenol 1000 mg in the morning and diclofenac 75 mg at nighttime along with knee brace use and as needed tramadol for breakthrough pain.

## 2022-02-25 NOTE — Assessment & Plan Note (Addendum)
Has previously tolerated phentermine well- will refill 30 mg tablets #30. Consider Wegovy weekly injection once manufacturer shortage is resolved. Recommend return in 4 to 6 weeks for weight management visit. Activity continues to be limited by right knee pain.

## 2022-02-25 NOTE — Progress Notes (Signed)
[name], female    DOB: April 11, 1966, 56 y.o.   MRN: [id number]  This visit was conducted in person.  BP 120/64   Pulse 73   Temp 97.7 F (36.5 C) (Temporal)   Ht 5\' 8"  (1.727 m)   Wt 206 lb 4 oz (93.6 kg)   SpO2 98%   BMI 31.36 kg/m    CC: knee pain Subjective:   HPI: Jasmine Willis is a 56 y.o. female presenting on 02/25/2022 for Knee Pain (C/o R knee pain. Started about 7 yrs ago.  H/o arthritis.  Had cortisone shot about 2 mos ago. Then started having L foot pain, then R hip pain while compensating for knee pain. )   Longstanding R knee pain due to OA s/p steroid injection 3 months ago (Dr 04/28/2022 at [facility]). Per his note, medial compartment is bone on bone. Bad reaction - passed out, nausea/vomiting/diarrhea. Discussing visco-supplementation.   Now with L foot arch pain and R hip pain which she attributes to compensating for knee pain.  She's been using $75 arches.  She continues diclofenac 75mg  bid through ortho  Previously on phentermine 2020-2021 then saxenda titrated up to 3mg  dose but with resultant GI upset and nausea and decreased libido. Insurance did not fully cover Contrave. Phentermine trial again 07/2021.   Wellbutrin SR 100mg  - feels this is effective to help mood.   Recent dental work, now with full upper and lower dentures - to see tomorrow.      Relevant past medical, surgical, family and social history reviewed and updated as indicated. Interim medical history since our last visit reviewed. Allergies and medications reviewed and updated. Outpatient Medications Prior to Visit  Medication Sig Dispense Refill   buPROPion ER (WELLBUTRIN SR) 100 MG 12 hr tablet Take 1 tablet (100 mg total) by mouth daily. 30 tablet 6   diclofenac (VOLTAREN) 75 MG EC tablet Take one tab PO BID with food 30 tablet 0   meloxicam (MOBIC) 15 MG tablet Take 1 tablet (15 mg total) by mouth daily as needed for pain. 30 tablet 1   phentermine 37.5 MG capsule  Take 1 capsule (37.5 mg total) by mouth every morning. 30 capsule 1   Cholecalciferol (VITAMIN D3) 25 MCG (1000 UT) CAPS Take 1 capsule (1,000 Units total) by mouth daily. 30 capsule    Cyanocobalamin (B-12) 1000 MCG SUBL Place 1 tablet under the tongue daily.     diclofenac (VOLTAREN) 75 MG EC tablet Take 1 tablet (75 mg total) by mouth at bedtime.     lidocaine (XYLOCAINE) 2 % solution Use 15 ml by mouth twice a day as directed as needed for discomfort. 140 mL 1   traMADol (ULTRAM) 50 MG tablet 1-2 q4-6 h prn pain 15 tablet 0   triazolam (HALCION) 0.25 MG tablet 2 tabs 1.5 hours prior dental visit 2 tablet 0   No facility-administered medications prior to visit.     Per HPI unless specifically indicated in ROS section below Review of Systems  Objective:  BP 120/64   Pulse 73   Temp 97.7 F (36.5 C) (Temporal)   Ht 5\' 8"  (1.727 m)   Wt 206 lb 4 oz (93.6 kg)   SpO2 98%   BMI 31.36 kg/m   Wt Readings from Last 3 Encounters:  02/25/22 206 lb 4 oz (93.6 kg)  11/25/21 210 lb (95.3 kg)  09/12/21 206 lb 9 oz (93.7 kg)      Physical  Exam Vitals and nursing note reviewed.  Constitutional:      Appearance: Normal appearance. She is not ill-appearing.  Cardiovascular:     Rate and Rhythm: Normal rate and regular rhythm.     Pulses: Normal pulses.     Heart sounds: Normal heart sounds. No murmur heard. Pulmonary:     Effort: Pulmonary effort is normal. No respiratory distress.     Breath sounds: Normal breath sounds. No wheezing or rales.  Musculoskeletal:        General: Tenderness present.     Right lower leg: No edema.     Left lower leg: No edema.     Comments:  L foot 2+ DP No rash or erythema No pain at base of 5th MT or at achilles or heel.  Neg calcaneus squeeze No ligament laxity with testing  Reproducible tenderness to palpation at midline of longitudinal arch   Skin:    General: Skin is warm and dry.     Findings: No rash.  Neurological:     Mental Status: She  is alert.  Psychiatric:        Mood and Affect: Mood normal.        Behavior: Behavior normal.       Results for orders placed or performed in visit on 11/25/21  Synovial Fluid Analysis, Complete  Result Value Ref Range   Site NOT GIVEN    Color, Synovial YELLOW STRAW/YELLOW   Appearance-Synovial HAZY CLEAR/HAZY   WBC, Synovial 202 (H) <150 cells/uL   Neutrophil, Synovial 5 0 - 24 %   Lymphocytes-Synovial Fld 50 0 - 74 %   Monocyte/Macrophage 44 0 - 69 %   Eosinophils-Synovial 0 0 - 2 %   Basophils, % 0 0 %   Synoviocytes, % 1 0 - 15 %   Crystals, Fluid  NONE SEEN /HPF  TIQ-NTM  Result Value Ref Range   QUESTION/PROBLEM:     SPECIMEN(S) RECEIVED: Specimen RF unneeded     Assessment & Plan:   Problem List Items Addressed This Visit     Obesity (BMI 30.0-34.9)    Has previously tolerated phentermine well- will refill 30 mg tablets #30. Consider Wegovy weekly injection once manufacturer shortage is resolved. Recommend return in 4 to 6 weeks for weight management visit. Activity continues to be limited by right knee pain.      Depression with anxiety    Overall doing well on SR 100 Wellbutrin once daily - continue.       Relevant Medications   buPROPion ER (WELLBUTRIN SR) 100 MG 12 hr tablet   Primary osteoarthritis of right knee    Recent Ortho evaluation with an x-ray showing marked progression of knee osteoarthritis especially in medial compartment.  She is seen Ortho who did not recommend surgical intervention at this time, however  this is significantly limiting her activity levels including functioning at work.  Did suggest she seek second surgical opinion. Did not have a great response to steroid injection and states she actually did not tolerate well. For now, continue Tylenol 1000 mg in the morning and diclofenac 75 mg at nighttime along with knee brace use and as needed tramadol for breakthrough pain.      Relevant Medications   acetaminophen (TYLENOL) 500 MG  tablet   traMADol (ULTRAM) 50 MG tablet   diclofenac (VOLTAREN) 75 MG EC tablet   Left foot pain    Significant pain at left longitudinal arch of foot, reproducible pain to palpation however not  consistent with tendinitis.  Question capsulitis versus bursitis-recommend midfoot sleeve or compression stockings and if ongoing, podiatry referral.        Meds ordered this encounter  Medications   acetaminophen (TYLENOL) 500 MG tablet    Sig: Take 2 tablets (1,000 mg total) by mouth daily.   traMADol (ULTRAM) 50 MG tablet    Sig: Take 1 tablet (50 mg total) by mouth 2 (two) times daily as needed for up to 5 days.    Dispense:  15 tablet    Refill:  0   diclofenac (VOLTAREN) 75 MG EC tablet    Sig: Take 1 tablet (75 mg total) by mouth at bedtime.    Dispense:  30 tablet    Refill:  1   buPROPion ER (WELLBUTRIN SR) 100 MG 12 hr tablet    Sig: Take 1 tablet (100 mg total) by mouth daily.    Dispense:  30 tablet    Refill:  6   phentermine 30 MG capsule    Sig: Take 1 capsule (30 mg total) by mouth every morning.    Dispense:  30 capsule    Refill:  0   No orders of the defined types were placed in this encounter.    Patient Instructions  Use knee brace.  Get second opinion on knee surgery.  Phentermine filled for a month, return in 4-6 weeks for weight follow up. Ok to continue diclofenac 75mg  nightly, with tylenol 1000mg  daily. May use tramadol for breakthrough pain.  Consider taking pepcid nightly on days you take diclofenac (voltaren). For left foot - use midfoot sleeve  or continue compression stockings. If ongoing pain we can refer you to podiatrist (foot doctor).   Follow up plan: Return in about 4 weeks (around 03/25/2022), or if symptoms worsen or fail to improve, for follow up visit.  , MD

## 2022-02-25 NOTE — Patient Instructions (Addendum)
Use knee brace.  Get second opinion on knee surgery.  Phentermine filled for a month, return in 4-6 weeks for weight follow up. Ok to continue diclofenac 75mg  nightly, with tylenol 1000mg  daily. May use tramadol for breakthrough pain.  Consider taking pepcid nightly on days you take diclofenac (voltaren). For left foot - use midfoot sleeve  or continue compression stockings. If ongoing pain we can refer you to podiatrist (foot doctor).

## 2022-02-25 NOTE — Assessment & Plan Note (Signed)
Overall doing well on SR 100 Wellbutrin once daily - continue.

## 2022-02-25 NOTE — Assessment & Plan Note (Signed)
Significant pain at left longitudinal arch of foot, reproducible pain to palpation however not consistent with tendinitis.  Question capsulitis versus bursitis-recommend midfoot sleeve or compression stockings and if ongoing, podiatry referral.

## 2022-02-26 ENCOUNTER — Other Ambulatory Visit: Payer: Self-pay

## 2022-03-22 ENCOUNTER — Other Ambulatory Visit: Payer: Self-pay

## 2022-03-22 ENCOUNTER — Other Ambulatory Visit: Payer: Self-pay | Admitting: Family Medicine

## 2022-03-23 ENCOUNTER — Other Ambulatory Visit: Payer: Self-pay

## 2022-03-23 NOTE — Telephone Encounter (Signed)
Name of Medication: Phentermine Name of Pharmacy: [facility] Last Fill or Written Date and Quantity: 02/27/22, #30 Last Office Visit and Type: 02/25/22, wt mgmt Next Office Visit and Type: none Last Controlled Substance Agreement Date: none Last UDS: none

## 2022-03-24 ENCOUNTER — Other Ambulatory Visit: Payer: Self-pay

## 2022-03-24 ENCOUNTER — Other Ambulatory Visit: Payer: Self-pay | Admitting: Family Medicine

## 2022-03-24 MED ORDER — PREDNISOLONE SODIUM PHOSPHATE 15 MG/5ML PO SOLN
OROMUCOSAL | 1 refills | Status: DC
  Filled 2022-03-24: qty 300, 20d supply, fill #0

## 2022-03-24 NOTE — Telephone Encounter (Signed)
Refill request Tylenol Last office visit 02/25/22 Changed to two daily 02/25/22

## 2022-03-24 NOTE — Telephone Encounter (Signed)
Duplicate request

## 2022-03-25 ENCOUNTER — Other Ambulatory Visit: Payer: Self-pay

## 2022-03-25 MED ORDER — ACETAMINOPHEN 500 MG PO TABS
1000.0000 mg | ORAL_TABLET | Freq: Every day | ORAL | 6 refills | Status: DC
Start: 2022-03-25 — End: 2022-04-15

## 2022-03-25 MED FILL — Phentermine HCl Cap 30 MG: ORAL | 30 days supply | Qty: 30 | Fill #0 | Status: AC

## 2022-03-25 MED FILL — Phentermine HCl Cap 30 MG: ORAL | 30 days supply | Qty: 30 | Fill #0 | Status: CN

## 2022-03-25 NOTE — Telephone Encounter (Signed)
Patient has been scheduled

## 2022-03-25 NOTE — Telephone Encounter (Signed)
ERx. Pt will need OV prior to more refills.

## 2022-03-25 NOTE — Telephone Encounter (Signed)
ERx 

## 2022-03-27 ENCOUNTER — Telehealth: Payer: Self-pay | Admitting: Orthopaedic Surgery

## 2022-03-27 ENCOUNTER — Other Ambulatory Visit: Payer: Self-pay

## 2022-03-27 ENCOUNTER — Ambulatory Visit: Payer: 59 | Admitting: Orthopaedic Surgery

## 2022-03-27 DIAGNOSIS — M1711 Unilateral primary osteoarthritis, right knee: Secondary | ICD-10-CM | POA: Diagnosis not present

## 2022-03-27 MED ORDER — LIDOCAINE HCL 1 % IJ SOLN
2.0000 mL | INTRAMUSCULAR | Status: AC | PRN
  Administered 2022-03-27: 2 mL

## 2022-03-27 MED ORDER — BUPIVACAINE HCL 0.5 % IJ SOLN
2.0000 mL | INTRAMUSCULAR | Status: AC | PRN
  Administered 2022-03-27: 2 mL via INTRA_ARTICULAR

## 2022-03-27 MED ORDER — METHYLPREDNISOLONE ACETATE 40 MG/ML IJ SUSP
40.0000 mg | INTRAMUSCULAR | Status: AC | PRN
  Administered 2022-03-27: 40 mg via INTRA_ARTICULAR

## 2022-03-27 NOTE — Telephone Encounter (Signed)
Pt states she is still in  a lot of pain and that the injection has yet to change anything

## 2022-03-27 NOTE — Progress Notes (Signed)
Office Visit Note   Patient: Jasmine Willis           Date of Birth: 02-28-1966           MRN: [id number] Visit Date: 03/27/2022              Requested by: Eustaquio Boyden, MD [address] [place],  [place] [address] PCP: Eustaquio Boyden, MD   Assessment & Plan: Visit Diagnoses:  1. Primary osteoarthritis of right knee     Plan: Impression is acute right knee pain.  I suspect that she overdid it somehow.  We injected the right knee with cortisone and she requested an IM Toradol injection as well.  We will see her back as needed.  Follow-Up Instructions: No follow-ups on file.   Orders:  No orders of the defined types were placed in this encounter.  No orders of the defined types were placed in this encounter.     Procedures: Large Joint Inj: R knee on 03/27/2022 10:39 AM Indications: pain Details: 22 G needle  Arthrogram: No  Medications: 40 mg methylPREDNISolone acetate 40 MG/ML; 2 mL lidocaine 1 %; 2 mL bupivacaine 0.5 % Consent was given by the patient. Patient was prepped and draped in the usual sterile fashion.       Clinical Data: No additional findings.   Subjective: Chief Complaint  Patient presents with   Right Knee - Pain    HPI Jasmine Willis comes in today for acute onset of right knee pain.  Started 2 days ago.  Denies any injuries.  Denies constitutional symptoms.  Has trouble bearing weight.  Review of Systems  Constitutional: Negative.   HENT: Negative.    Eyes: Negative.   Respiratory: Negative.    Cardiovascular: Negative.   Endocrine: Negative.   Musculoskeletal: Negative.   Neurological: Negative.   Hematological: Negative.   Psychiatric/Behavioral: Negative.    All other systems reviewed and are negative.    Objective: Vital Signs: There were no vitals taken for this visit.  Physical Exam Vitals and nursing note reviewed.  Constitutional:      Appearance: She is well-developed.  HENT:     Head: Atraumatic.      Nose: Nose normal.  Eyes:     Extraocular Movements: Extraocular movements intact.  Cardiovascular:     Pulses: Normal pulses.  Pulmonary:     Effort: Pulmonary effort is normal.  Abdominal:     Palpations: Abdomen is soft.  Musculoskeletal:     Cervical back: Neck supple.  Skin:    General: Skin is warm.     Capillary Refill: Capillary refill takes less than 2 seconds.  Neurological:     Mental Status: She is alert. Mental status is at baseline.  Psychiatric:        Behavior: Behavior normal.        Thought Content: Thought content normal.        Judgment: Judgment normal.     Ortho Exam Examination of the right knee shows no joint effusion.  No warmth signs of infection.  Joint line tenderness.  Collaterals and cruciates are stable.  Guarding to range of motion. Specialty Comments:  No specialty comments available.  Imaging: No results found.   PMFS History: Patient Active Problem List   Diagnosis Date Noted   Left foot pain 02/25/2022   Sea sickness 05/07/2021   Chronic pain of right knee 11/11/2020   Pain, dental 11/11/2020   Low vitamin B12 level 03/10/2019  Vitamin D deficiency 03/10/2019   Fatigue 06/23/2018   Primary osteoarthritis of right knee 06/23/2018   IDA (iron deficiency anemia) 06/23/2018   Health maintenance examination 08/09/2015   Obesity (BMI 30.0-34.9) 08/09/2015   Depression with anxiety    Past Medical History:  Diagnosis Date   Anxiety     Family History  Problem Relation Age of Onset   Diabetes Maternal Grandfather    Cancer Paternal Grandmother        liver?   Heart disease Father        pacemaker   Parkinson's disease Father    CAD Neg Hx    Stroke Neg Hx     Past Surgical History:  Procedure Laterality Date   NO PAST SURGERIES     Social History   Occupational History   Not on file  Tobacco Use   Smoking status: Never   Smokeless tobacco: Never  Substance and Sexual Activity   Alcohol use: Yes    Alcohol/week:  0.0 standard drinks of alcohol   Drug use: No   Sexual activity: Not on file

## 2022-03-27 NOTE — Telephone Encounter (Signed)
She may want to consider going to the emergency room or the urgent care

## 2022-03-28 ENCOUNTER — Emergency Department
Admission: EM | Admit: 2022-03-28 | Discharge: 2022-03-28 | Disposition: A | Discharge status: Home / Self Care | Payer: 59 | Attending: Student in an Organized Health Care Education/Training Program | Admitting: Student in an Organized Health Care Education/Training Program

## 2022-03-28 ENCOUNTER — Emergency Department: Payer: 59

## 2022-03-28 ENCOUNTER — Encounter: Payer: Self-pay | Admitting: Emergency Medicine

## 2022-03-28 ENCOUNTER — Other Ambulatory Visit: Payer: Self-pay

## 2022-03-28 DIAGNOSIS — M1711 Unilateral primary osteoarthritis, right knee: Secondary | ICD-10-CM | POA: Insufficient documentation

## 2022-03-28 DIAGNOSIS — M25561 Pain in right knee: Secondary | ICD-10-CM

## 2022-03-28 DIAGNOSIS — M7121 Synovial cyst of popliteal space [Baker], right knee: Secondary | ICD-10-CM | POA: Insufficient documentation

## 2022-03-28 DIAGNOSIS — M79604 Pain in right leg: Secondary | ICD-10-CM | POA: Diagnosis not present

## 2022-03-28 DIAGNOSIS — M25551 Pain in right hip: Secondary | ICD-10-CM | POA: Insufficient documentation

## 2022-03-28 DIAGNOSIS — M1611 Unilateral primary osteoarthritis, right hip: Secondary | ICD-10-CM | POA: Diagnosis not present

## 2022-03-28 MED ORDER — OXYCODONE-ACETAMINOPHEN 5-325 MG PO TABS: 1.0000 | ORAL_TABLET | Freq: Four times a day (QID) | ORAL | 0 refills | Status: DC | PRN

## 2022-03-28 MED ORDER — OXYCODONE-ACETAMINOPHEN 7.5-325 MG PO TABS
1.0000 | ORAL_TABLET | Freq: Once | ORAL | Status: AC
  Administered 2022-03-28: 1 via ORAL
  Filled 2022-03-28: qty 1

## 2022-03-28 MED ORDER — MORPHINE SULFATE (PF) 4 MG/ML IV SOLN
4.0000 mg | Freq: Once | INTRAVENOUS | Status: AC
  Administered 2022-03-28: 4 mg via INTRAVENOUS
  Filled 2022-03-28: qty 1

## 2022-03-28 MED ORDER — LORAZEPAM 2 MG/ML IJ SOLN
1.0000 mg | Freq: Once | INTRAMUSCULAR | Status: DC
  Filled 2022-03-28: qty 1

## 2022-03-28 MED ORDER — GADOBUTROL 1 MMOL/ML IV SOLN
10.0000 mL | Freq: Once | INTRAVENOUS | Status: AC | PRN
  Administered 2022-03-28: 10 mL via INTRAVENOUS

## 2022-03-28 NOTE — ED Provider Notes (Signed)
[facility] Provider Note    Event Date/Time   First MD Initiated Contact with Patient 03/28/22 [phone]     (approximate)   History   Knee Pain and Leg Pain   HPI  Jasmine Willis is a 56 y.o. female   presents to the ED with complaint of right knee pain radiating into her right hip.  Patient also reports that today she is also having right calf pain and swelling without recent injury.  Patient does have a history of knee problems and is currently seeing her orthopedist in [place].  Yesterday she was seen in his office and received cortisone injection to her right knee and another into her right hip.  Patient states that today she is unable to bear weight or walk without assistance.      Physical Exam   Triage Vital Signs: ED Triage Vitals  Enc Vitals Group     BP 03/28/22 0834 (!) 141/93     Pulse Rate 03/28/22 0834 89     Resp 03/28/22 0834 17     Temp 03/28/22 0834 97.6 F (36.4 C)     Temp Source 03/28/22 0834 Oral     SpO2 03/28/22 0834 100 %     Weight 03/28/22 0830 206 lb 5.6 oz (93.6 kg)     Height 03/28/22 0830 5\' 8"  (1.727 m)     Head Circumference --      Peak Flow --      Pain Score 03/28/22 0830 9     Pain Loc --      Pain Edu? --      Excl. in GC? --     Most recent vital signs: Vitals:   03/28/22 1331 03/28/22 1543  BP:  (!) 150/93  Pulse:  71  Resp:  18  Temp: 97.6 F (36.4 C)   SpO2:  98%     General: Awake, no distress.  CV:  Good peripheral perfusion.  Resp:  Normal effort.  Abd:  No distention.  Other:  On examination of the right lower extremity there is no gross deformity however there is tenderness on palpation of the right lateral hip area with increased pain with range of motion.  No effusion is noted on examination of the right knee, no erythema, no warmth, range of motion is limited secondary to increased pain.  No point tenderness on palpation of posterior calf, no erythema or warmth is noted.  Pulse is  present both DP and TP.  05/28/22' sign is positive.   ED Results / Procedures / Treatments   Labs (all labs ordered are listed, but only abnormal results are displayed) Labs Reviewed - No data to display    RADIOLOGY Ultrasound venous right lower extremity was negative for DVT but does mention a Baker's cyst present. Right knee x-ray images were reviewed from 11/2021 with osteoarthritis noted.  X-ray of the right hip and 1 view pelvis was reviewed and interpreted as no acute injury, osteoarthritis was noted.  Radiology report of the right hip mentions  2 sclerotic lesions within the right femoral head and the right ilac bone.  Metastatic disease cannot be excluded and rule out by MRI.  MRI with contrast of the right hip to evaluate to sclerotic lesions as noted above on plain films.  Metastatic disease was excluded per radiologist.  Mild degenerative changes noted to the right hip and lower spine.  PROCEDURES:  Critical Care performed:   Procedures   MEDICATIONS ORDERED  IN ED: Medications  LORazepam (ATIVAN) injection 1 mg (1 mg Intravenous Not Given 03/28/22 1416)  oxyCODONE-acetaminophen (PERCOCET) 7.5-325 MG per tablet 1 tablet (1 tablet Oral Given 03/28/22 0858)  morphine (PF) 4 MG/ML injection 4 mg (4 mg Intravenous Given 03/28/22 1205)  gadobutrol (GADAVIST) 1 MMOL/ML injection 10 mL (10 mLs Intravenous Contrast Given 03/28/22 1422)  oxyCODONE-acetaminophen (PERCOCET) 7.5-325 MG per tablet 1 tablet (1 tablet Oral Given 03/28/22 1545)     IMPRESSION / MDM / ASSESSMENT AND PLAN / ED COURSE  I reviewed the triage vital signs and the nursing notes.   Differential diagnosis includes, but is not limited to, DVT right lower extremity, osteoarthritis right knee, acute on chronic right knee pain, right hip pain.  56 year old female presents to the ED with complaint of acute on chronic right knee pain after being seen by her orthopedist yesterday and was given steroid injection to her right  knee and right hip.  Patient states that the pain is worse today and she is unable to bear weight without pain.  She has taken over-the-counter medication without any relief.  She also complains of right calf pain and is low suspicion for DVT however ultrasound was still ordered and did rule out DVT being the cause of her right lower extremity pain.  Right knee x-ray images were reviewed from April 2023.  There was a questionable area for concern for metastatic disease on x-ray of the right hip and MRI was ordered to exclude this diagnosis.  MRI was negative per radiologist.  Patient's pain was controlled with initially Percocet and then morphine 4 mg IV.  I discussed at length with patient and husband that she would need to stay ahead of the pain rather than letting it get so severe.  Also after a joint cortisone injection can actually flareup worse symptoms initially before the steroid starts working and she should be feeling much better at least by Monday.  She was given a note to remain out of work through Wednesday to give her ample time to follow-up with her orthopedist.  Patient was improved with pain control prior to discharge.      Patient's presentation is most consistent with acute presentation with potential threat to life or bodily function.  FINAL CLINICAL IMPRESSION(S) / ED DIAGNOSES   Final diagnoses:  Acute right hip pain  Right knee pain, unspecified chronicity  Primary osteoarthritis of right knee  Baker's cyst of knee, right     Rx / DC Orders   ED Discharge Orders          Ordered    oxyCODONE-acetaminophen (PERCOCET) 5-325 MG tablet  Every 6 hours PRN        03/28/22 1520             Note:  This document was prepared using Dragon voice recognition software and may include unintentional dictation errors.   Tommi Rumps, PA-C 03/28/22 1620    Willy Eddy, MD 03/30/22 [phone]

## 2022-03-28 NOTE — ED Notes (Signed)
MRI had called and stated that patient was report anxiety about the MRI even with going in feet first. Kerrie Buffalo. PA-C informed and ordered Ativan. This Clinical research associate took the Ativan to MRI after have a 2nd RN witness the waste, and the patient was now tolerating the MRI. Kerrie Buffalo. PA-C aware and Ativan dosage was wasted with a 2nd RN through the [employer].

## 2022-03-28 NOTE — Discharge Instructions (Signed)
Call your orthopedist tomorrow for further instructions.  A prescription for pain medication was sent to the pharmacy to take as needed.  Stay ahead of the pain rather than let the pain gets so severe that it cannot be controlled.  You may take 1 or 2 tablets every 6 hours as needed for pain.

## 2022-03-28 NOTE — ED Triage Notes (Signed)
Pt reports woke up yesterday with swelling to her right knee and pain to her right knee and hip. Pt reports went to her MD and he gave her a cortisone shot in her right knee and another shot in her hip but it has not helped . Pt reports has not been able to walk well.

## 2022-03-28 NOTE — ED Notes (Signed)
Pt stated that the pain began in her right leg yesterday and runs from her hip down to her calf. Pt stated that the pain is a 10/10 when standing and 8/10 when laying/sitting in the bed. Pt was able to transfer herself from the wheelchair to the bed with some assistance from her cane.

## 2022-03-28 NOTE — ED Notes (Signed)
Pt was able to take medication with no complications

## 2022-03-30 ENCOUNTER — Other Ambulatory Visit: Payer: Self-pay

## 2022-03-30 DIAGNOSIS — M5416 Radiculopathy, lumbar region: Secondary | ICD-10-CM | POA: Diagnosis not present

## 2022-03-30 MED ORDER — PREDNISONE 10 MG PO TABS
ORAL_TABLET | ORAL | 0 refills | Status: DC
  Filled 2022-03-30: qty 21, 6d supply, fill #0

## 2022-03-30 MED ORDER — GABAPENTIN 300 MG PO CAPS
ORAL_CAPSULE | ORAL | 0 refills | Status: DC
  Filled 2022-03-30: qty 30, 15d supply, fill #0

## 2022-03-31 ENCOUNTER — Ambulatory Visit (INDEPENDENT_AMBULATORY_CARE_PROVIDER_SITE_OTHER): Payer: 59 | Admitting: Orthopaedic Surgery

## 2022-03-31 ENCOUNTER — Other Ambulatory Visit: Payer: Self-pay

## 2022-03-31 DIAGNOSIS — M79604 Pain in right leg: Secondary | ICD-10-CM | POA: Diagnosis not present

## 2022-03-31 MED ORDER — HYDROCODONE-ACETAMINOPHEN 5-325 MG PO TABS
1.0000 | ORAL_TABLET | Freq: Every day | ORAL | 0 refills | Status: DC | PRN
  Filled 2022-03-31: qty 14, 7d supply, fill #0

## 2022-03-31 NOTE — Progress Notes (Signed)
Office Visit Note   Patient: Jasmine Willis           Date of Birth: 21-Aug-1966           MRN: [id number] Visit Date: 03/31/2022              Requested by: Eustaquio Boyden, MD [address] [place],  [place] [address] PCP: Eustaquio Boyden, MD   Assessment & Plan: Visit Diagnoses:  1. Pain in right leg     Plan: Based on findings impression is lumbar radiculopathy.  She started prednisone Dosepak yesterday.  Encouraged her to give this more time.  Small supply of Norco was sent in today.  If symptoms persist may need to consider MRI of the lumbar spine.  I explained that the Baker's cyst typically does not cause this much pain and that I think it is more likely coming from her back.  Follow-Up Instructions: No follow-ups on file.   Orders:  No orders of the defined types were placed in this encounter.  Meds ordered this encounter  Medications   HYDROcodone-acetaminophen (NORCO) 5-325 MG tablet    Sig: Take 1-2 tablets by mouth daily as needed.    Dispense:  14 tablet    Refill:  0      Procedures: No procedures performed   Clinical Data: No additional findings.   Subjective: Chief Complaint  Patient presents with   Right Knee - Pain    HPI Alexandrina returns today for continued right knee and leg pain.  Went to the ER over the weekend and had an MRI and ultrasound.  MRI of the hip was negative for acute abnormalities.  This showed mild OA.  Ultrasound the knee showed Baker's cyst without a DVT.  Also saw [facility] in [place] who yesterday and diagnosed her with lumbar radiculopathy.  Review of Systems  Constitutional: Negative.   HENT: Negative.    Eyes: Negative.   Respiratory: Negative.    Cardiovascular: Negative.   Endocrine: Negative.   Musculoskeletal: Negative.   Neurological: Negative.   Hematological: Negative.   Psychiatric/Behavioral: Negative.    All other systems reviewed and are negative.    Objective: Vital Signs: There  were no vitals taken for this visit.  Physical Exam Vitals and nursing note reviewed.  Constitutional:      Appearance: She is well-developed.  Pulmonary:     Effort: Pulmonary effort is normal.  Skin:    General: Skin is warm.     Capillary Refill: Capillary refill takes less than 2 seconds.  Neurological:     Mental Status: She is alert and oriented to person, place, and time.  Psychiatric:        Behavior: Behavior normal.        Thought Content: Thought content normal.        Judgment: Judgment normal.     Ortho Exam Examination of right lower leg is unchanged.  Nonfocal.   Specialty Comments:  No specialty comments available.  Imaging: No results found.   PMFS History: Patient Active Problem List   Diagnosis Date Noted   Pain in right leg 03/31/2022   Left foot pain 02/25/2022   Sea sickness 05/07/2021   Chronic pain of right knee 11/11/2020   Pain, dental 11/11/2020   Low vitamin B12 level 03/10/2019   Vitamin D deficiency 03/10/2019   Fatigue 06/23/2018   Primary osteoarthritis of right knee 06/23/2018   IDA (iron deficiency anemia) 06/23/2018   Health maintenance examination  08/09/2015   Obesity (BMI 30.0-34.9) 08/09/2015   Depression with anxiety    Past Medical History:  Diagnosis Date   Anxiety     Family History  Problem Relation Age of Onset   Diabetes Maternal Grandfather    Cancer Paternal Grandmother        liver?   Heart disease Father        pacemaker   Parkinson's disease Father    CAD Neg Hx    Stroke Neg Hx     Past Surgical History:  Procedure Laterality Date   NO PAST SURGERIES     Social History   Occupational History   Not on file  Tobacco Use   Smoking status: Never   Smokeless tobacco: Never  Substance and Sexual Activity   Alcohol use: Yes    Alcohol/week: 0.0 standard drinks of alcohol   Drug use: No   Sexual activity: Not on file

## 2022-04-06 ENCOUNTER — Other Ambulatory Visit: Payer: Self-pay

## 2022-04-06 DIAGNOSIS — M545 Low back pain, unspecified: Secondary | ICD-10-CM | POA: Diagnosis not present

## 2022-04-06 DIAGNOSIS — M5136 Other intervertebral disc degeneration, lumbar region: Secondary | ICD-10-CM | POA: Diagnosis not present

## 2022-04-06 DIAGNOSIS — M9903 Segmental and somatic dysfunction of lumbar region: Secondary | ICD-10-CM | POA: Diagnosis not present

## 2022-04-06 DIAGNOSIS — M5416 Radiculopathy, lumbar region: Secondary | ICD-10-CM | POA: Diagnosis not present

## 2022-04-06 MED ORDER — TRAMADOL HCL 50 MG PO TABS
ORAL_TABLET | ORAL | 0 refills | Status: DC
  Filled 2022-04-06: qty 15, 5d supply, fill #0

## 2022-04-07 ENCOUNTER — Other Ambulatory Visit: Payer: Self-pay

## 2022-04-07 DIAGNOSIS — M545 Low back pain, unspecified: Secondary | ICD-10-CM | POA: Diagnosis not present

## 2022-04-07 DIAGNOSIS — M5416 Radiculopathy, lumbar region: Secondary | ICD-10-CM | POA: Diagnosis not present

## 2022-04-07 DIAGNOSIS — M5136 Other intervertebral disc degeneration, lumbar region: Secondary | ICD-10-CM | POA: Diagnosis not present

## 2022-04-07 DIAGNOSIS — M9903 Segmental and somatic dysfunction of lumbar region: Secondary | ICD-10-CM | POA: Diagnosis not present

## 2022-04-08 DIAGNOSIS — M5416 Radiculopathy, lumbar region: Secondary | ICD-10-CM | POA: Diagnosis not present

## 2022-04-08 DIAGNOSIS — M5136 Other intervertebral disc degeneration, lumbar region: Secondary | ICD-10-CM | POA: Diagnosis not present

## 2022-04-08 DIAGNOSIS — M9903 Segmental and somatic dysfunction of lumbar region: Secondary | ICD-10-CM | POA: Diagnosis not present

## 2022-04-08 DIAGNOSIS — M545 Low back pain, unspecified: Secondary | ICD-10-CM | POA: Diagnosis not present

## 2022-04-09 DIAGNOSIS — M545 Low back pain, unspecified: Secondary | ICD-10-CM | POA: Diagnosis not present

## 2022-04-09 DIAGNOSIS — M5136 Other intervertebral disc degeneration, lumbar region: Secondary | ICD-10-CM | POA: Diagnosis not present

## 2022-04-09 DIAGNOSIS — M9903 Segmental and somatic dysfunction of lumbar region: Secondary | ICD-10-CM | POA: Diagnosis not present

## 2022-04-09 DIAGNOSIS — M5416 Radiculopathy, lumbar region: Secondary | ICD-10-CM | POA: Diagnosis not present

## 2022-04-10 ENCOUNTER — Other Ambulatory Visit: Payer: Self-pay

## 2022-04-10 DIAGNOSIS — M5416 Radiculopathy, lumbar region: Secondary | ICD-10-CM | POA: Diagnosis not present

## 2022-04-10 DIAGNOSIS — M5136 Other intervertebral disc degeneration, lumbar region: Secondary | ICD-10-CM | POA: Diagnosis not present

## 2022-04-10 DIAGNOSIS — M545 Low back pain, unspecified: Secondary | ICD-10-CM | POA: Diagnosis not present

## 2022-04-10 DIAGNOSIS — M9903 Segmental and somatic dysfunction of lumbar region: Secondary | ICD-10-CM | POA: Diagnosis not present

## 2022-04-13 DIAGNOSIS — M5136 Other intervertebral disc degeneration, lumbar region: Secondary | ICD-10-CM | POA: Diagnosis not present

## 2022-04-13 DIAGNOSIS — M545 Low back pain, unspecified: Secondary | ICD-10-CM | POA: Diagnosis not present

## 2022-04-13 DIAGNOSIS — M5416 Radiculopathy, lumbar region: Secondary | ICD-10-CM | POA: Diagnosis not present

## 2022-04-13 DIAGNOSIS — M9903 Segmental and somatic dysfunction of lumbar region: Secondary | ICD-10-CM | POA: Diagnosis not present

## 2022-04-14 ENCOUNTER — Other Ambulatory Visit: Payer: Self-pay

## 2022-04-14 DIAGNOSIS — M5416 Radiculopathy, lumbar region: Secondary | ICD-10-CM | POA: Diagnosis not present

## 2022-04-14 MED ORDER — GABAPENTIN 300 MG PO CAPS
ORAL_CAPSULE | ORAL | 0 refills | Status: DC
  Filled 2022-04-14: qty 60, 20d supply, fill #0

## 2022-04-15 ENCOUNTER — Encounter: Payer: Self-pay | Admitting: Family Medicine

## 2022-04-15 ENCOUNTER — Other Ambulatory Visit: Payer: Self-pay

## 2022-04-15 ENCOUNTER — Ambulatory Visit: Payer: 59 | Admitting: Family Medicine

## 2022-04-15 VITALS — BP 134/82 | HR 86 | Temp 97.9°F | Ht 68.0 in | Wt 201.4 lb

## 2022-04-15 DIAGNOSIS — F418 Other specified anxiety disorders: Secondary | ICD-10-CM

## 2022-04-15 DIAGNOSIS — M9903 Segmental and somatic dysfunction of lumbar region: Secondary | ICD-10-CM | POA: Diagnosis not present

## 2022-04-15 DIAGNOSIS — M545 Low back pain, unspecified: Secondary | ICD-10-CM | POA: Diagnosis not present

## 2022-04-15 DIAGNOSIS — M5136 Other intervertebral disc degeneration, lumbar region: Secondary | ICD-10-CM | POA: Diagnosis not present

## 2022-04-15 DIAGNOSIS — E66811 Obesity, class 1: Secondary | ICD-10-CM

## 2022-04-15 DIAGNOSIS — M13861 Other specified arthritis, right knee: Secondary | ICD-10-CM | POA: Diagnosis not present

## 2022-04-15 DIAGNOSIS — M25561 Pain in right knee: Secondary | ICD-10-CM | POA: Diagnosis not present

## 2022-04-15 DIAGNOSIS — M79604 Pain in right leg: Secondary | ICD-10-CM

## 2022-04-15 DIAGNOSIS — E669 Obesity, unspecified: Secondary | ICD-10-CM

## 2022-04-15 DIAGNOSIS — M1711 Unilateral primary osteoarthritis, right knee: Secondary | ICD-10-CM | POA: Diagnosis not present

## 2022-04-15 DIAGNOSIS — M5416 Radiculopathy, lumbar region: Secondary | ICD-10-CM | POA: Diagnosis not present

## 2022-04-15 MED ORDER — PHENTERMINE HCL 30 MG PO CAPS
30.0000 mg | ORAL_CAPSULE | ORAL | 0 refills | Status: DC
  Filled 2022-04-15 – 2022-05-11 (×2): qty 30, 30d supply, fill #0

## 2022-04-15 MED ORDER — METHOCARBAMOL 500 MG PO TABS
ORAL_TABLET | ORAL | 3 refills | Status: DC
  Filled 2022-04-15: qty 60, 30d supply, fill #0
  Filled 2022-05-11: qty 60, 30d supply, fill #1
  Filled 2022-06-04: qty 60, 30d supply, fill #2
  Filled 2022-07-06: qty 60, 30d supply, fill #3

## 2022-04-15 MED ORDER — DICLOFENAC SODIUM 75 MG PO TBEC
75.0000 mg | DELAYED_RELEASE_TABLET | Freq: Every day | ORAL | 3 refills | Status: DC
  Filled 2022-04-15: qty 30, 30d supply, fill #0
  Filled 2022-06-01: qty 30, 30d supply, fill #1
  Filled 2022-07-05: qty 30, 30d supply, fill #2
  Filled 2022-07-31: qty 30, 30d supply, fill #3

## 2022-04-15 NOTE — Progress Notes (Unsigned)
[name], female    DOB: September 07, 1965, 56 y.o.   MRN: [id number]  This visit was conducted in person.  BP 134/82   Pulse 86   Temp 97.9 F (36.6 C) (Temporal)   Ht 5\' 8"  (1.727 m)   Wt 201 lb 6 oz (91.3 kg)   SpO2 97%   BMI 30.62 kg/m    CC: weight follow up visit  Subjective:   HPI: Jasmine Willis is a 55 y.o. female presenting on 04/15/2022 for Weight Management (Here for 4-6 wk f/u.)   Starting weight: 206 lbs last visit when we restarted phentermine. Initial starting weight 229 lbs.  Last weight: 206 lbs Today's weight 201 lbs  Previously on phentermine 2020-2021 then saxenda titrated up to 3mg  dose but with resultant GI upset and nausea and decreased libido. Insurance did not fully cover Contrave. Phentermine trial again 07/2021.    Wellbutrin SR 100mg  - feels this is effective to help mood.   Restarted phentermine 02/2022, tolerating well.   24 hour recall: Not done  Activity regimen: Limited by right leg pain as per below   Longstanding R knee OA s/p steroid injections, sees Dr 08/2021 at [facility]. Rec against surgery. Latest steroid injection into R knee was 03/27/2022. Seen at ER the next day with worsened R knee ?post-steroid injection flare. [facility]a Shutters, xray, MRI R hip without acute findings. Saw ortho Dr [facility] in follow up the next week for ongoing R leg pain, started on prednisone taper for R lumbar radiculopathy.  Released from care for [facility] in [place].  Now seeing [facility] in [place].   Just had lumbar MRI done through [facility] yesterday - found herniated disc.   Just saw Dr Waterford today.  Currently taking tylenol 500mg  2 tablets twice daily, as well as gabapentin 300mg  twice daily, methocarbamol 500mg  BID PRN.  Upcoming appt with Dr Derby back doctor on Friday.      Relevant past medical, surgical, family and social history reviewed and updated as indicated. Interim medical history since our last visit reviewed. Allergies and  medications reviewed and updated. Outpatient Medications Prior to Visit  Medication Sig Dispense Refill   buPROPion ER (WELLBUTRIN SR) 100 MG 12 hr tablet Take 1 tablet (100 mg total) by mouth daily. 30 tablet 6   diclofenac (VOLTAREN) 75 MG EC tablet Take 1 tablet (75 mg total) by mouth at bedtime. 30 tablet 1   gabapentin (NEURONTIN) 300 MG capsule Take 1 capsule by mouth three times daily as needed 60 capsule 0   methocarbamol (ROBAXIN) 500 MG tablet Take 1 tablet twice a day by oral route. 60 tablet 3   phentermine 30 MG capsule Take 1 capsule (30 mg total) by mouth every morning. 30 capsule 0   acetaminophen (TYLENOL) 500 MG tablet Take 2 tablets (1,000 mg total) by mouth daily. 60 tablet 6   HYDROcodone-acetaminophen (NORCO) 5-325 MG tablet Take 1-2 tablets by mouth daily as needed. 14 tablet 0   magic mouthwash (multi-ingredient) oral suspension Swish 1 tsp 30 seconds and spit out tid 300 mL 1   oxyCODONE-acetaminophen (PERCOCET) 5-325 MG tablet Take 1-2 tablets by mouth every 6 (six) hours as needed for severe pain. 30 tablet 0   predniSONE (DELTASONE) 10 MG tablet Take 6 tablets by mouth on day 1, then decrease by one tablet each day 21 tablet 0   traMADol (ULTRAM) 50 MG tablet Take 1 tablet every 8 hours by oral route for 5 days. 15  tablet 0   No facility-administered medications prior to visit.     Per HPI unless specifically indicated in ROS section below Review of Systems  Objective:  BP 134/82   Pulse 86   Temp 97.9 F (36.6 C) (Temporal)   Ht 5\' 8"  (1.727 m)   Wt 201 lb 6 oz (91.3 kg)   SpO2 97%   BMI 30.62 kg/m   Wt Readings from Last 3 Encounters:  04/15/22 201 lb 6 oz (91.3 kg)  03/28/22 206 lb 5.6 oz (93.6 kg)  02/25/22 206 lb 4 oz (93.6 kg)      Physical Exam Vitals and nursing note reviewed.  Constitutional:      Appearance: Normal appearance. She is not ill-appearing.  HENT:     Mouth/Throat:     Mouth: Mucous membranes are moist.     Pharynx:  Oropharynx is clear. No oropharyngeal exudate or posterior oropharyngeal erythema.  Eyes:     Extraocular Movements: Extraocular movements intact.     Pupils: Pupils are equal, round, and reactive to light.  Cardiovascular:     Rate and Rhythm: Normal rate and regular rhythm.     Pulses: Normal pulses.     Heart sounds: Normal heart sounds. No murmur heard. Pulmonary:     Effort: Pulmonary effort is normal. No respiratory distress.     Breath sounds: Normal breath sounds. No wheezing, rhonchi or rales.  Musculoskeletal:     Right lower leg: No edema.     Left lower leg: No edema.  Skin:    General: Skin is warm and dry.     Findings: No rash.  Neurological:     Mental Status: She is alert.  Psychiatric:        Mood and Affect: Mood normal.        Behavior: Behavior normal.       Results for orders placed or performed in visit on 11/25/21  Synovial Fluid Analysis, Complete  Result Value Ref Range   Site NOT GIVEN    Color, Synovial YELLOW STRAW/YELLOW   Appearance-Synovial HAZY CLEAR/HAZY   WBC, Synovial 202 (H) <150 cells/uL   Neutrophil, Synovial 5 0 - 24 %   Lymphocytes-Synovial Fld 50 0 - 74 %   Monocyte/Macrophage 44 0 - 69 %   Eosinophils-Synovial 0 0 - 2 %   Basophils, % 0 0 %   Synoviocytes, % 1 0 - 15 %   Crystals, Fluid  NONE SEEN /HPF  TIQ-NTM  Result Value Ref Range   QUESTION/PROBLEM:     SPECIMEN(S) RECEIVED: Specimen RF unneeded     Assessment & Plan:   Problem List Items Addressed This Visit   None    No orders of the defined types were placed in this encounter.  No orders of the defined types were placed in this encounter.    Patient Instructions  We will request to get records from [facility] in [place].  Check into non-impact exercise to conserve cartilage (swimming, biking).  Return in 1-2 months for weight follow up visit.   Follow up plan: No follow-ups on file.  [place], MD

## 2022-04-15 NOTE — Patient Instructions (Addendum)
We will request to get records from [facility] in [place].  Check into non-impact exercise to conserve cartilage (swimming, biking).  Return in 1-2 months for weight follow up visit.

## 2022-04-16 ENCOUNTER — Telehealth: Payer: Self-pay | Admitting: Family Medicine

## 2022-04-16 ENCOUNTER — Encounter: Payer: Self-pay | Admitting: Family Medicine

## 2022-04-16 ENCOUNTER — Other Ambulatory Visit: Payer: Self-pay

## 2022-04-16 DIAGNOSIS — M5136 Other intervertebral disc degeneration, lumbar region: Secondary | ICD-10-CM | POA: Diagnosis not present

## 2022-04-16 DIAGNOSIS — M9903 Segmental and somatic dysfunction of lumbar region: Secondary | ICD-10-CM | POA: Diagnosis not present

## 2022-04-16 DIAGNOSIS — M5416 Radiculopathy, lumbar region: Secondary | ICD-10-CM | POA: Diagnosis not present

## 2022-04-16 DIAGNOSIS — M545 Low back pain, unspecified: Secondary | ICD-10-CM | POA: Diagnosis not present

## 2022-04-16 NOTE — Telephone Encounter (Signed)
Noted  

## 2022-04-16 NOTE — Assessment & Plan Note (Signed)
Saw [facility] now establishing with [facility] in [place]).  S/p knee steroid injections, may have experienced "cortisone flare" with temporary increased pain after latest injection.

## 2022-04-16 NOTE — Assessment & Plan Note (Addendum)
Has had several evaluations in the past month both at ER and by 2 ortho offices. Anticipate multifactorial leg pain including knee osteoarthritis and now newly diagnosed lumbar herniated disc by recent MRI, she has ortho f/u scheduled for this Friday and next Monday, anticipate both with spine surgeon and PM&R. Reviewed all this with patient, will request latest records from [facility] including lumbar spine MRI.  Reviewed current pain regimen - tylenol, gabapentin, voltaren refilled. She recently received methocarbamol through ortho.

## 2022-04-16 NOTE — Assessment & Plan Note (Addendum)
Overall tolerating phentermine well with resultant 5 lb weight loss.  Continue this, activity limitations pending treatment for right leg pain.  Consider transition to [facility] in future. Did not tolerate daily saxenda.  RTC 4-6 wks weight management f/u visit.

## 2022-04-16 NOTE — Telephone Encounter (Signed)
[facility] records have been received via Care everywhere. Thank you!

## 2022-04-16 NOTE — Assessment & Plan Note (Signed)
Stable period on wellbutrin SR 100mg  once daily - continue this

## 2022-04-17 DIAGNOSIS — M5416 Radiculopathy, lumbar region: Secondary | ICD-10-CM | POA: Diagnosis not present

## 2022-04-24 ENCOUNTER — Other Ambulatory Visit: Payer: Self-pay

## 2022-04-24 MED ORDER — GABAPENTIN 100 MG PO CAPS
ORAL_CAPSULE | ORAL | 0 refills | Status: DC
  Filled 2022-04-24: qty 60, 20d supply, fill #0

## 2022-05-08 DIAGNOSIS — M5416 Radiculopathy, lumbar region: Secondary | ICD-10-CM | POA: Diagnosis not present

## 2022-05-11 ENCOUNTER — Other Ambulatory Visit: Payer: Self-pay

## 2022-05-12 ENCOUNTER — Other Ambulatory Visit: Payer: Self-pay

## 2022-05-12 MED ORDER — LIDOCAINE VISCOUS HCL 2 % MT SOLN
OROMUCOSAL | 1 refills | Status: DC
  Filled 2022-05-12: qty 100, 2d supply, fill #0
  Filled 2022-05-19: qty 100, 3d supply, fill #0
  Filled 2022-06-01: qty 100, 3d supply, fill #1

## 2022-05-13 ENCOUNTER — Other Ambulatory Visit: Payer: Self-pay

## 2022-05-18 ENCOUNTER — Other Ambulatory Visit: Payer: Self-pay

## 2022-05-18 MED ORDER — GABAPENTIN 100 MG PO CAPS
ORAL_CAPSULE | ORAL | 0 refills | Status: DC
  Filled 2022-05-18: qty 60, 20d supply, fill #0

## 2022-05-19 ENCOUNTER — Other Ambulatory Visit: Payer: Self-pay

## 2022-05-26 ENCOUNTER — Telehealth: Payer: Self-pay

## 2022-05-26 NOTE — Telephone Encounter (Signed)
I tried calling patient to see if the FMLA form for her husband, Jesus, is for continuous or intermittent leave.  I could not leave a vmail, the phone rang multiple times, then said that it could not be completed as dialed.

## 2022-05-26 NOTE — Telephone Encounter (Signed)
Received pt's faxed FMLA form from [employer].  Needs to be faxed to [phone] when completed.   Fwd form to [employer] (rad tech) to complete.

## 2022-05-27 NOTE — Telephone Encounter (Signed)
Noted  

## 2022-05-27 NOTE — Telephone Encounter (Signed)
Completed forms have been faxed to [phone].  Received fax confirmation.  Copies have been made for billing, scanning, patient, and myself.  Patient's copy has been placed in outgoing mail to home address on system.

## 2022-05-27 NOTE — Telephone Encounter (Signed)
Spoke to [place] and ws advised that it is for intermittent leave. Jasmine Willis stated that it is primarily for her to take him for appointments.

## 2022-06-01 ENCOUNTER — Other Ambulatory Visit: Payer: Self-pay

## 2022-06-02 ENCOUNTER — Other Ambulatory Visit: Payer: Self-pay

## 2022-06-04 ENCOUNTER — Other Ambulatory Visit: Payer: Self-pay

## 2022-06-04 ENCOUNTER — Other Ambulatory Visit: Payer: Self-pay | Admitting: Family Medicine

## 2022-06-05 ENCOUNTER — Other Ambulatory Visit: Payer: Self-pay

## 2022-06-05 NOTE — Telephone Encounter (Signed)
Name of Medication: Phentermine Name of Pharmacy: [facility] or Written Date and Quantity: 05/12/22, #30 Last Office Visit and Type: 04/15/22, wt mgmt f/u Next Office Visit and Type: 06/17/22, wt mgmt f/u Last Controlled Substance Agreement Date: none Last UDS: none

## 2022-06-08 ENCOUNTER — Other Ambulatory Visit: Payer: Self-pay

## 2022-06-08 MED ORDER — PHENTERMINE HCL 30 MG PO CAPS
30.0000 mg | ORAL_CAPSULE | ORAL | 0 refills | Status: DC
  Filled 2022-06-08: qty 30, 30d supply, fill #0

## 2022-06-08 NOTE — Telephone Encounter (Signed)
ER[facility] Upcoming appt next week.

## 2022-06-09 ENCOUNTER — Other Ambulatory Visit: Payer: Self-pay

## 2022-06-09 MED ORDER — GABAPENTIN 100 MG PO CAPS
ORAL_CAPSULE | ORAL | 0 refills | Status: DC
  Filled 2022-06-09: qty 60, 30d supply, fill #0

## 2022-06-17 ENCOUNTER — Ambulatory Visit: Payer: 59 | Admitting: Family Medicine

## 2022-06-17 ENCOUNTER — Encounter: Payer: Self-pay | Admitting: Family Medicine

## 2022-06-17 VITALS — BP 124/78 | HR 90 | Temp 97.4°F | Ht 68.0 in | Wt 202.0 lb

## 2022-06-17 DIAGNOSIS — F418 Other specified anxiety disorders: Secondary | ICD-10-CM

## 2022-06-17 DIAGNOSIS — E669 Obesity, unspecified: Secondary | ICD-10-CM | POA: Diagnosis not present

## 2022-06-17 DIAGNOSIS — R202 Paresthesia of skin: Secondary | ICD-10-CM | POA: Diagnosis not present

## 2022-06-17 DIAGNOSIS — R200 Anesthesia of skin: Secondary | ICD-10-CM | POA: Insufficient documentation

## 2022-06-17 DIAGNOSIS — M1711 Unilateral primary osteoarthritis, right knee: Secondary | ICD-10-CM

## 2022-06-17 NOTE — Progress Notes (Unsigned)
[name], female    DOB: December 18, 1965, 56 y.o.   MRN: [id number]  This visit was conducted in person.  BP 124/78   Pulse 90   Temp (!) 97.4 F (36.3 C) (Temporal)   Ht 5\' 8"  (1.727 m)   Wt 202 lb (91.6 kg)   SpO2 96%   BMI 30.71 kg/m    CC: weigh management f/u visit  Subjective:   HPI: Jasmine Willis is a 56 y.o. female presenting on 06/17/2022 for Weight Management (Here for f/u.)   Starting weight: 206 lbs (02/2022) Peak weight: 229 lbs (2020) Last weight: 201 lbs Today's weight 202 lbs  Previously on phentermine 2020-2021 then saxenda titrated up to 3mg  dose but with resultant GI upset and nausea and decreased libido. Insurance did not fully cover Contrave.  Phentermine trial again 07/2021, 02/2022.   She also continues wellbutrin SR 100mg  daily for mood.   24 hour recall: Skipped breakfast, drank pepsi and water bottle Lunch 12:30pm - bbq pork sandwich with water  A few saltine crackers for snack 4pm chocolate bar and pepsi  Dinner - glass of wine, doesn't remember if she had dinner, may have skipped it.  1.5 cup of strawberries, pimento cheese with 4 crackers and popcorn for snack.   Activity regimen: Limited by R knee pain.    [facility] Dr Kayleen Memos for lower back pain, s/p MRI showing herniated disc. She is on tylenol 500mg  2 tab BID, gabapentin 300mg  BID, methocarmabol 500mg  BID PRN.  She most recently completed lumbar spine ESI 1-2 months ago with benefit.   Notes numbness to R hand overnight for last several weeks.      Relevant past medical, surgical, family and social history reviewed and updated as indicated. Interim medical history since our last visit reviewed. Allergies and medications reviewed and updated. Outpatient Medications Prior to Visit  Medication Sig Dispense Refill   buPROPion ER (WELLBUTRIN SR) 100 MG 12 hr tablet Take 1 tablet (100 mg total) by mouth daily. 30 tablet 6   diclofenac (VOLTAREN) 75 MG EC  tablet Take 1 tablet (75 mg total) by mouth daily. 30 tablet 3   methocarbamol (ROBAXIN) 500 MG tablet Take 1 tablet twice a day by oral route. 60 tablet 3   phentermine 30 MG capsule Take 1 capsule (30 mg total) by mouth every morning. 30 capsule 0   gabapentin (NEURONTIN) 100 MG capsule Take by mouth as directed 60 capsule 0   gabapentin (NEURONTIN) 100 MG capsule Take 1 capsule (100 mg total) by mouth 2 (two) times daily.     gabapentin (NEURONTIN) 300 MG capsule Take 1 capsule by mouth three times daily as needed 60 capsule 0   lidocaine (XYLOCAINE) 2 % solution take 15 milliliters and swish and spit out by oral route every 3 hours 300 mL 1   No facility-administered medications prior to visit.     Per HPI unless specifically indicated in ROS section below Review of Systems  Objective:  BP 124/78   Pulse 90   Temp (!) 97.4 F (36.3 C) (Temporal)   Ht 5\' 8"  (1.727 m)   Wt 202 lb (91.6 kg)   SpO2 96%   BMI 30.71 kg/m   Wt Readings from Last 3 Encounters:  06/17/22 202 lb (91.6 kg)  04/15/22 201 lb 6 oz (91.3 kg)  03/28/22 206 lb 5.6 oz (93.6 kg)      Physical Exam Vitals and nursing note reviewed.  Constitutional:      Appearance: Normal appearance. She is not ill-appearing.  HENT:     Head: Normocephalic and atraumatic.     Mouth/Throat:     Mouth: Mucous membranes are moist.     Pharynx: Oropharynx is clear. No oropharyngeal exudate or posterior oropharyngeal erythema.  Eyes:     Extraocular Movements: Extraocular movements intact.     Conjunctiva/sclera: Conjunctivae normal.     Pupils: Pupils are equal, round, and reactive to light.  Cardiovascular:     Rate and Rhythm: Normal rate and regular rhythm.     Pulses: Normal pulses.     Heart sounds: Normal heart sounds. No murmur heard. Pulmonary:     Effort: Pulmonary effort is normal. No respiratory distress.     Breath sounds: Normal breath sounds. No wheezing, rhonchi or rales.  Musculoskeletal:     Right  lower leg: No edema.     Left lower leg: No edema.  Skin:    General: Skin is warm and dry.     Findings: No rash.  Neurological:     Mental Status: She is alert.     Comments: Tinel and phalen negative  Psychiatric:        Mood and Affect: Mood normal.        Behavior: Behavior normal.       Lab Results  Component Value Date   CREATININE 0.86 07/30/2021   BUN 11 07/30/2021   NA 138 07/30/2021   K 4.9 07/30/2021   CL 104 07/30/2021   CO2 27 07/30/2021    Lab Results  Component Value Date   WBC 7.3 07/30/2021   HGB 13.7 07/30/2021   HCT 40.1 07/30/2021   MCV 85.1 07/30/2021   PLT 313.0 07/30/2021   Assessment & Plan:   Problem List Items Addressed This Visit     Obesity (BMI 30.0-34.9) - Primary    Encouraged healthy diet and lifestyle choices to affect sustainable weight loss. Reviewed 24 hour food recall, recommendations made ie work on more regular meals, less snacks, work on fruits/vegetable intake, ensure protein source with each meal.  Continue phentermine daily.  Activity limited due to knee pain.  RTC 6-8 wks f/u weight management.  Discussed likely transition to trial wegovy after 5-6 wks of phentermine.       Depression with anxiety    Continue wellbutrin SR 100mg  daily.       Primary osteoarthritis of right knee    Limits activity. S/p steroid injections, has been recommendedd against knee replacement at a young age.       Numbness and tingling in right hand    Discussed possible CTS - rec wrist brace at night time.         No orders of the defined types were placed in this encounter.  No orders of the defined types were placed in this encounter.    Patient Instructions  Continue phentermine Work on protein source, veggies daily, fiber.  Return in 6 wks for follow up visit.  Try wrist brace for possible right carpal tunnel syndrome  Follow up plan: No follow-ups on file.  , MD

## 2022-06-17 NOTE — Patient Instructions (Signed)
Continue phentermine Work on protein source, veggies daily, fiber.  Return in 6 wks for follow up visit.  Try wrist brace for possible right carpal tunnel syndrome

## 2022-06-17 NOTE — Assessment & Plan Note (Signed)
Discussed possible CTS - rec wrist brace at night time.

## 2022-06-18 ENCOUNTER — Other Ambulatory Visit: Payer: Self-pay

## 2022-06-18 ENCOUNTER — Encounter: Payer: Self-pay | Admitting: Family Medicine

## 2022-06-18 NOTE — Assessment & Plan Note (Addendum)
Limits activity. S/p steroid injections, has been recommendedd against knee replacement at a young age.

## 2022-06-18 NOTE — Assessment & Plan Note (Signed)
Continue wellbutrin SR 100mg  daily.

## 2022-06-18 NOTE — Assessment & Plan Note (Addendum)
Encouraged healthy diet and lifestyle choices to affect sustainable weight loss. Reviewed 24 hour food recall, recommendations made ie work on more regular meals, less snacks, work on fruits/vegetable intake, ensure protein source with each meal.  Continue phentermine daily.  Activity limited due to knee pain.  RTC 6-8 wks f/u weight management.  Discussed likely transition to trial wegovy after 5-6 wks of phentermine.

## 2022-06-26 ENCOUNTER — Encounter: Payer: Self-pay | Admitting: Family Medicine

## 2022-06-29 ENCOUNTER — Other Ambulatory Visit: Payer: Self-pay

## 2022-06-29 MED ORDER — HYDROXYZINE HCL 25 MG PO TABS
25.0000 mg | ORAL_TABLET | Freq: Two times a day (BID) | ORAL | 0 refills | Status: DC | PRN
  Filled 2022-06-29: qty 30, 15d supply, fill #0

## 2022-06-29 NOTE — Addendum Note (Signed)
Addended by: Ria Bush on: 06/29/2022 08:01 AM   Modules accepted: Orders

## 2022-07-05 ENCOUNTER — Other Ambulatory Visit: Payer: Self-pay | Admitting: Family Medicine

## 2022-07-06 ENCOUNTER — Other Ambulatory Visit: Payer: Self-pay

## 2022-07-06 NOTE — Telephone Encounter (Signed)
Refill request Phentermine Last office visit 06/17/22 Last refill 06/08/22 #30

## 2022-07-07 ENCOUNTER — Other Ambulatory Visit: Payer: Self-pay

## 2022-07-07 MED ORDER — PHENTERMINE HCL 30 MG PO CAPS
30.0000 mg | ORAL_CAPSULE | ORAL | 0 refills | Status: DC
  Filled 2022-07-07: qty 30, 30d supply, fill #0

## 2022-07-07 NOTE — Telephone Encounter (Signed)
ERx 

## 2022-07-30 ENCOUNTER — Other Ambulatory Visit: Payer: Self-pay | Admitting: Family Medicine

## 2022-07-30 DIAGNOSIS — F418 Other specified anxiety disorders: Secondary | ICD-10-CM

## 2022-07-30 NOTE — Telephone Encounter (Signed)
Couldn't leave vm, sent mychart message  

## 2022-07-30 NOTE — Telephone Encounter (Signed)
Patient called and stated can she have 90 prescriptions for all her medications. Call back number [phone].

## 2022-07-30 NOTE — Telephone Encounter (Signed)
Plz schedule CPE and lab visits for additional refills.  Then return request to Dr. Timoteo Expose pool for refills.

## 2022-07-31 ENCOUNTER — Other Ambulatory Visit: Payer: Self-pay

## 2022-07-31 MED ORDER — BUPROPION HCL ER (SR) 100 MG PO TB12
100.0000 mg | ORAL_TABLET | Freq: Every day | ORAL | 0 refills | Status: DC
  Filled 2022-07-31: qty 90, 90d supply, fill #0

## 2022-07-31 MED ORDER — HYDROXYZINE HCL 25 MG PO TABS
25.0000 mg | ORAL_TABLET | Freq: Two times a day (BID) | ORAL | 0 refills | Status: DC | PRN
  Filled 2022-07-31: qty 90, 45d supply, fill #0

## 2022-07-31 NOTE — Telephone Encounter (Signed)
Cpe scheduled for 10/12/22

## 2022-07-31 NOTE — Telephone Encounter (Signed)
Noted  

## 2022-07-31 NOTE — Telephone Encounter (Signed)
Pt requests 90-day rxs

## 2022-07-31 NOTE — Addendum Note (Signed)
Addended by: Nanci Pina on: 07/31/2022 10:37 AM   Modules accepted: Orders

## 2022-08-04 ENCOUNTER — Other Ambulatory Visit: Payer: Self-pay

## 2022-08-04 MED ORDER — DICLOFENAC SODIUM 75 MG PO TBEC
75.0000 mg | DELAYED_RELEASE_TABLET | Freq: Every day | ORAL | 3 refills | Status: DC
  Filled 2022-08-04 – 2022-08-30 (×2): qty 30, 30d supply, fill #0
  Filled 2022-10-02: qty 30, 30d supply, fill #1

## 2022-08-04 MED ORDER — PHENTERMINE HCL 30 MG PO CAPS
30.0000 mg | ORAL_CAPSULE | ORAL | 0 refills | Status: DC
Start: 2022-08-04 — End: 2022-10-06
  Filled 2022-08-04: qty 30, 30d supply, fill #0

## 2022-08-04 MED ORDER — METHOCARBAMOL 500 MG PO TABS
ORAL_TABLET | ORAL | 3 refills | Status: DC
Start: 2022-08-04 — End: 2022-10-12
  Filled 2022-08-04: qty 60, 30d supply, fill #0
  Filled 2022-08-30: qty 60, 30d supply, fill #1
  Filled 2022-10-02: qty 60, 30d supply, fill #2

## 2022-08-04 MED ORDER — GABAPENTIN 100 MG PO CAPS
100.0000 mg | ORAL_CAPSULE | Freq: Two times a day (BID) | ORAL | 1 refills | Status: DC
  Filled 2022-08-04: qty 180, 90d supply, fill #0
  Filled 2022-08-30 – 2022-10-02 (×2): qty 180, 90d supply, fill #1

## 2022-08-04 NOTE — Telephone Encounter (Signed)
ERx 

## 2022-08-31 ENCOUNTER — Other Ambulatory Visit: Payer: Self-pay

## 2022-09-02 ENCOUNTER — Encounter: Payer: Self-pay | Admitting: Family Medicine

## 2022-09-02 MED ORDER — ZEPBOUND 2.5 MG/0.5ML ~~LOC~~ SOAJ
2.5000 mg | SUBCUTANEOUS | 0 refills | Status: DC
  Filled 2022-09-02 – 2022-09-14 (×3): qty 2, 28d supply, fill #0

## 2022-09-03 ENCOUNTER — Other Ambulatory Visit: Payer: Self-pay

## 2022-09-09 ENCOUNTER — Other Ambulatory Visit: Payer: Self-pay

## 2022-09-09 NOTE — Telephone Encounter (Signed)
Pt states she was told by pharmacy they sent over approval for meds, tirzepatide (ZEPBOUND) 2.5 MG/0.5ML Pen but haven't gotten a response yet. Call back # [phone]

## 2022-09-09 NOTE — Telephone Encounter (Signed)
Plz submit PA for Zepbound. 

## 2022-09-11 ENCOUNTER — Other Ambulatory Visit (HOSPITAL_COMMUNITY): Payer: Self-pay

## 2022-09-11 NOTE — Telephone Encounter (Signed)
Patient Advocate Encounter   Received notification from [employer] that prior authorization for Zepbound 2.5mg /0.8ml is required.   PA submitted on 09/11/2022 Key [id number] Status is pending

## 2022-09-14 ENCOUNTER — Other Ambulatory Visit (HOSPITAL_COMMUNITY): Payer: Self-pay

## 2022-09-14 ENCOUNTER — Other Ambulatory Visit: Payer: Self-pay

## 2022-09-14 NOTE — Telephone Encounter (Signed)
Pharmacy Patient Advocate Encounter  Prior Authorization for Zepbound 2.5MG /0.5ML has been approved.    PA# [id number] Effective dates: 09/10/2022 through 03/11/2023

## 2022-09-14 NOTE — Telephone Encounter (Signed)
Patient notified by telephone that the PA on Zepbound has been approved and she should be able to get it filled at her pharmacy.

## 2022-09-16 ENCOUNTER — Other Ambulatory Visit: Payer: Self-pay

## 2022-09-29 DIAGNOSIS — H524 Presbyopia: Secondary | ICD-10-CM | POA: Diagnosis not present

## 2022-09-29 DIAGNOSIS — H5213 Myopia, bilateral: Secondary | ICD-10-CM | POA: Diagnosis not present

## 2022-10-02 ENCOUNTER — Other Ambulatory Visit: Payer: Self-pay

## 2022-10-02 ENCOUNTER — Other Ambulatory Visit: Payer: Self-pay | Admitting: Family Medicine

## 2022-10-02 DIAGNOSIS — F418 Other specified anxiety disorders: Secondary | ICD-10-CM

## 2022-10-02 NOTE — Telephone Encounter (Signed)
Name of Medication: Phentermine Name of Pharmacy: [facility] or Written Date and Quantity: 08/04/22, #30 Last Office Visit and Type: 06/17/22, wt mgmt f/u Next Office Visit and Type: 10/12/22, wt mgmt f/u Last Controlled Substance Agreement Date: none Last UDS: none  Bupropion last filled:  08/04/22, #30

## 2022-10-02 NOTE — Telephone Encounter (Signed)
Duplicate request

## 2022-10-05 ENCOUNTER — Other Ambulatory Visit: Payer: Self-pay

## 2022-10-06 ENCOUNTER — Other Ambulatory Visit: Payer: Self-pay

## 2022-10-06 MED FILL — Bupropion HCl Tab ER 12HR 100 MG: ORAL | 90 days supply | Qty: 90 | Fill #0 | Status: CN

## 2022-10-06 MED FILL — Phentermine HCl Cap 30 MG: ORAL | 30 days supply | Qty: 30 | Fill #0 | Status: AC

## 2022-10-06 NOTE — Telephone Encounter (Signed)
ERx Will need appt prior to more refills of phentermine

## 2022-10-07 ENCOUNTER — Other Ambulatory Visit: Payer: Self-pay

## 2022-10-07 NOTE — Telephone Encounter (Signed)
Spoke with Jasmine Willis relaying Dr. Synthia Innocent message. Jasmine Willis verbalizes understanding and scheduled f/u on 10/13/21 at 3:30.

## 2022-10-12 ENCOUNTER — Ambulatory Visit (INDEPENDENT_AMBULATORY_CARE_PROVIDER_SITE_OTHER): Payer: Commercial Managed Care - PPO | Admitting: Family Medicine

## 2022-10-12 ENCOUNTER — Encounter: Payer: Self-pay | Admitting: Family Medicine

## 2022-10-12 ENCOUNTER — Other Ambulatory Visit: Payer: Self-pay

## 2022-10-12 VITALS — BP 120/76 | HR 81 | Temp 97.4°F | Ht 65.5 in | Wt 203.5 lb

## 2022-10-12 DIAGNOSIS — Z1211 Encounter for screening for malignant neoplasm of colon: Secondary | ICD-10-CM | POA: Diagnosis not present

## 2022-10-12 DIAGNOSIS — E669 Obesity, unspecified: Secondary | ICD-10-CM

## 2022-10-12 DIAGNOSIS — E559 Vitamin D deficiency, unspecified: Secondary | ICD-10-CM | POA: Diagnosis not present

## 2022-10-12 DIAGNOSIS — Z1231 Encounter for screening mammogram for malignant neoplasm of breast: Secondary | ICD-10-CM

## 2022-10-12 DIAGNOSIS — F418 Other specified anxiety disorders: Secondary | ICD-10-CM

## 2022-10-12 DIAGNOSIS — Z0000 Encounter for general adult medical examination without abnormal findings: Secondary | ICD-10-CM

## 2022-10-12 DIAGNOSIS — D509 Iron deficiency anemia, unspecified: Secondary | ICD-10-CM | POA: Diagnosis not present

## 2022-10-12 DIAGNOSIS — R7989 Other specified abnormal findings of blood chemistry: Secondary | ICD-10-CM

## 2022-10-12 MED ORDER — BUPROPION HCL ER (SR) 100 MG PO TB12
100.0000 mg | ORAL_TABLET | Freq: Every day | ORAL | 4 refills | Status: DC
  Filled 2022-10-13: qty 90, 90d supply, fill #0
  Filled 2023-01-27: qty 90, 90d supply, fill #1
  Filled 2023-02-11 – 2023-04-11 (×2): qty 90, 90d supply, fill #2
  Filled 2023-05-25 – 2023-07-18 (×3): qty 90, 90d supply, fill #3
  Filled 2023-09-28: qty 90, 90d supply, fill #4

## 2022-10-12 MED ORDER — ZEPBOUND 5 MG/0.5ML ~~LOC~~ SOAJ
5.0000 mg | SUBCUTANEOUS | 1 refills | Status: DC
  Filled 2022-10-12: qty 2, 28d supply, fill #0
  Filled 2022-11-10: qty 2, 28d supply, fill #1

## 2022-10-12 MED ORDER — GABAPENTIN 100 MG PO CAPS
100.0000 mg | ORAL_CAPSULE | Freq: Two times a day (BID) | ORAL | 3 refills | Status: DC
  Filled 2022-10-12 – 2022-10-30 (×2): qty 180, 90d supply, fill #0
  Filled 2023-01-27: qty 180, 90d supply, fill #1
  Filled 2023-05-01: qty 180, 90d supply, fill #2
  Filled 2023-05-25 – 2023-07-18 (×2): qty 180, 90d supply, fill #3

## 2022-10-12 MED ORDER — DICLOFENAC SODIUM 75 MG PO TBEC
75.0000 mg | DELAYED_RELEASE_TABLET | Freq: Every day | ORAL | 3 refills | Status: DC
  Filled 2022-10-12 – 2022-10-30 (×2): qty 30, 30d supply, fill #0
  Filled 2022-12-17: qty 30, 30d supply, fill #1
  Filled 2023-01-11: qty 30, 30d supply, fill #2
  Filled 2023-02-11: qty 30, 30d supply, fill #3

## 2022-10-12 MED ORDER — METHOCARBAMOL 500 MG PO TABS
500.0000 mg | ORAL_TABLET | Freq: Two times a day (BID) | ORAL | 3 refills | Status: DC
  Filled 2022-10-12: qty 60, fill #0
  Filled 2022-10-15 – 2022-10-30 (×2): qty 60, 30d supply, fill #0
  Filled 2022-12-17: qty 60, 30d supply, fill #1
  Filled 2023-01-11: qty 60, 30d supply, fill #2
  Filled 2023-02-11: qty 60, 30d supply, fill #3

## 2022-10-12 MED ORDER — HYDROXYZINE HCL 25 MG PO TABS
25.0000 mg | ORAL_TABLET | Freq: Two times a day (BID) | ORAL | 1 refills | Status: DC | PRN
  Filled 2022-10-12: qty 30, 15d supply, fill #0
  Filled 2023-01-27: qty 30, 15d supply, fill #1

## 2022-10-12 NOTE — Assessment & Plan Note (Signed)
Update levels off replacement. 

## 2022-10-12 NOTE — Assessment & Plan Note (Signed)
Encourage healthy diet and lifestyle choices.  She will keep weight management f/u visit for tomorrow on Zepbound.

## 2022-10-12 NOTE — Assessment & Plan Note (Signed)
Update labs when she returns tomorrow.

## 2022-10-12 NOTE — Progress Notes (Signed)
[name], female    DOB: [birth year], 57 y.o.   MRN: [id number]  This visit was conducted in person.  BP 120/76   Pulse 81   Temp (!) 97.4 F (36.3 C) (Temporal)   Ht 5' 5.5" (1.664 m)   Wt 203 lb 8 oz (92.3 kg)   SpO2 98%   BMI 33.35 kg/m    CC: CPE Subjective:   HPI: Jasmine Willis is a 57 y.o. female presenting on 10/12/2022 for Annual Exam   Preventative: Colon cancer screening - last iFOB normal 2020 - request rpt iFOB Well woman with [facility] - states normal pap smears, s/p uterine ablation 08/2020. due for rpt 3 yrs.  Mammogram 2011 - new referral placed.  LMP - 2+ years ago  Lung cancer screening - non smoker  Flu shot at work [employer] 08/2019, 09/2019, booster 05/2020 Tetanus - unsure  Shingrix - 04/2020, 07/2021 Seat belt use discussed Sunscreen use discussed, no changing moles on skin  Sleep - averaging 8 hours/night Non smoker Alcohol - glass of wine 3/wk Dentist q6 mo  Eye exam yearly   Lives with husband and 2 children, 1 dog Edu: some college Occupation: works at [employer] Activity: walking 2 miles 3x/wk  Diet: good water, fruits/vegetables daily     Relevant past medical, surgical, family and social history reviewed and updated as indicated. Interim medical history since our last visit reviewed. Allergies and medications reviewed and updated. Outpatient Medications Prior to Visit  Medication Sig Dispense Refill   buPROPion ER (WELLBUTRIN SR) 100 MG 12 hr tablet Take 1 tablet (100 mg total) by mouth daily. 90 tablet 1   diclofenac (VOLTAREN) 75 MG EC tablet Take 1 tablet (75 mg total) by mouth daily. 30 tablet 3   gabapentin (NEURONTIN) 100 MG capsule Take 1 capsule (100 mg total) by mouth 2 (two) times daily. 180 capsule 1   hydrOXYzine (ATARAX) 25 MG tablet Take 1 tablet (25 mg total) by mouth 2 (two) times daily as needed for anxiety. 90 tablet 0   methocarbamol (ROBAXIN) 500 MG tablet Take 1 tablet  twice a day by oral route. 60 tablet 3   tirzepatide (ZEPBOUND) 2.5 MG/0.5ML Pen Inject 2.5 mg into the skin once a week. 2 mL 0   gabapentin (NEURONTIN) 100 MG capsule Take by mouth as directed 60 capsule 0   phentermine 30 MG capsule Take 1 capsule (30 mg total) by mouth every morning. 30 capsule 0   No facility-administered medications prior to visit.     Per HPI unless specifically indicated in ROS section below Review of Systems  Constitutional:  Negative for activity change, appetite change, chills, fatigue, fever and unexpected weight change.  HENT:  Negative for hearing loss.   Eyes:  Negative for visual disturbance.  Respiratory:  Negative for cough, chest tightness, shortness of breath and wheezing.   Cardiovascular:  Negative for chest pain, palpitations and leg swelling.  Gastrointestinal:  Negative for abdominal distention, abdominal pain, blood in stool, constipation, diarrhea, nausea and vomiting.  Genitourinary:  Negative for difficulty urinating and hematuria.  Musculoskeletal:  Negative for arthralgias, myalgias and neck pain.  Skin:  Negative for rash.  Neurological:  Negative for dizziness, seizures, syncope and headaches.  Hematological:  Negative for adenopathy. Does not bruise/bleed easily.  Psychiatric/Behavioral:  Negative for dysphoric mood. The patient is not nervous/anxious.     Objective:  BP 120/76   Pulse 81  Temp (!) 97.4 F (36.3 C) (Temporal)   Ht 5' 5.5" (1.664 m)   Wt 203 lb 8 oz (92.3 kg)   SpO2 98%   BMI 33.35 kg/m   Wt Readings from Last 3 Encounters:  10/12/22 203 lb 8 oz (92.3 kg)  06/17/22 202 lb (91.6 kg)  04/15/22 201 lb 6 oz (91.3 kg)      Physical Exam Vitals and nursing note reviewed.  Constitutional:      Appearance: Normal appearance. She is not ill-appearing.  HENT:     Head: Normocephalic and atraumatic.     Right Ear: Tympanic membrane, ear canal and external ear normal. There is no impacted cerumen.     Left Ear:  Tympanic membrane, ear canal and external ear normal. There is no impacted cerumen.  Eyes:     General:        Right eye: No discharge.        Left eye: No discharge.     Extraocular Movements: Extraocular movements intact.     Conjunctiva/sclera: Conjunctivae normal.     Pupils: Pupils are equal, round, and reactive to light.  Neck:     Thyroid: No thyroid mass or thyromegaly.  Cardiovascular:     Rate and Rhythm: Normal rate and regular rhythm.     Pulses: Normal pulses.     Heart sounds: Normal heart sounds. No murmur heard. Pulmonary:     Effort: Pulmonary effort is normal. No respiratory distress.     Breath sounds: Normal breath sounds. No wheezing, rhonchi or rales.  Abdominal:     General: Bowel sounds are normal. There is no distension.     Palpations: Abdomen is soft. There is no mass.     Tenderness: There is no abdominal tenderness. There is no guarding or rebound.     Hernia: No hernia is present.  Musculoskeletal:     Cervical back: Normal range of motion and neck supple. No rigidity.     Right lower leg: No edema.     Left lower leg: No edema.  Lymphadenopathy:     Cervical: No cervical adenopathy.  Skin:    General: Skin is warm and dry.     Findings: No rash.  Neurological:     General: No focal deficit present.     Mental Status: She is alert. Mental status is at baseline.  Psychiatric:        Mood and Affect: Mood normal.        Behavior: Behavior normal.        Lab Results  Component Value Date   TSH 2.19 05/07/2020    Assessment & Plan:   Problem List Items Addressed This Visit     Health maintenance examination - Primary (Chronic)    Preventative protocols reviewed and updated unless pt declined. Discussed healthy diet and lifestyle.       Obesity (BMI 30.0-34.9)    Encourage healthy diet and lifestyle choices.  She will keep weight management f/u visit for tomorrow on Zepbound.      Relevant Orders   Lipid panel   Comprehensive  metabolic panel   Depression with anxiety    Stable period on low dose wellbutrin SR - continue this.      Relevant Medications   hydrOXYzine (ATARAX) 25 MG tablet   buPROPion ER (WELLBUTRIN SR) 100 MG 12 hr tablet   IDA (iron deficiency anemia)    Update labs when she returns tomorrow.  Relevant Orders   Ferritin   IBC panel   CBC with Differential/Platelet   Low vitamin B12 level    Update levls tomorrow - not taking regularly.       Relevant Orders   Vitamin B12   Vitamin D deficiency    Update levels off replacement.       Relevant Orders   VITAMIN D 25 Hydroxy (Vit-D Deficiency, Fractures)   Other Visit Diagnoses     Encounter for screening mammogram for malignant neoplasm of breast       Relevant Orders   MM 3D SCREEN BREAST BILATERAL   Special screening for malignant neoplasms, colon       Relevant Orders   Fecal occult blood, imunochemical        Meds ordered this encounter  Medications   gabapentin (NEURONTIN) 100 MG capsule    Sig: Take 1 capsule (100 mg total) by mouth 2 (two) times daily.    Dispense:  180 capsule    Refill:  3   hydrOXYzine (ATARAX) 25 MG tablet    Sig: Take 1 tablet (25 mg total) by mouth 2 (two) times daily as needed for anxiety.    Dispense:  30 tablet    Refill:  1   buPROPion ER (WELLBUTRIN SR) 100 MG 12 hr tablet    Sig: Take 1 tablet (100 mg total) by mouth daily.    Dispense:  90 tablet    Refill:  4   methocarbamol (ROBAXIN) 500 MG tablet    Sig: Take 1 tablet twice a day by oral route.    Dispense:  60 tablet    Refill:  3   diclofenac (VOLTAREN) 75 MG EC tablet    Sig: Take 1 tablet (75 mg total) by mouth daily.    Dispense:  30 tablet    Refill:  3   tirzepatide (ZEPBOUND) 5 MG/0.5ML Pen    Sig: Inject 5 mg into the skin once a week.    Dispense:  0.5 mL    Refill:  1    Orders Placed This Encounter  Procedures   Fecal occult blood, imunochemical    Standing Status:   Future    Standing Expiration  Date:   10/13/2023   MM 3D SCREEN BREAST BILATERAL    Standing Status:   Future    Standing Expiration Date:   10/13/2023    Order Specific Question:   Reason for Exam (SYMPTOM  OR DIAGNOSIS REQUIRED)    Answer:   breast cancer screen    Order Specific Question:   Preferred imaging location?    Answer:   [facility]    Order Specific Question:   Is the patient pregnant?    Answer:   No   Lipid panel    Standing Status:   Future    Standing Expiration Date:   10/13/2023   Comprehensive metabolic panel    Standing Status:   Future    Standing Expiration Date:   10/13/2023   Vitamin B12    Standing Status:   Future    Standing Expiration Date:   10/13/2023   Ferritin    Standing Status:   Future    Standing Expiration Date:   10/13/2023   IBC panel    Standing Status:   Future    Standing Expiration Date:   10/13/2023   CBC with Differential/Platelet    Standing Status:   Future    Standing Expiration Date:   10/13/2023  VITAMIN D 25 Hydroxy (Vit-D Deficiency, Fractures)    Standing Status:   Future    Standing Expiration Date:   10/13/2023    Patient Instructions  Labs tomorrow.  Pass by lab to pick up stool kit.  Call to schedule mammogram at your convenience: [facility] at [facility] [phone] Meds refilled today.  Return tomorrow for Zepbound follow up.  Return in 1 year for next physical.   Follow up plan: Return in about 1 year (around 10/13/2023) for annual exam, prior fasting for blood work.  Ria Bush, MD

## 2022-10-12 NOTE — Assessment & Plan Note (Signed)
Stable period on low dose wellbutrin SR - continue this.

## 2022-10-12 NOTE — Assessment & Plan Note (Signed)
Update levls tomorrow - not taking regularly.

## 2022-10-12 NOTE — Patient Instructions (Addendum)
Labs tomorrow.  Pass by lab to pick up stool kit.  Call to schedule mammogram at your convenience: [facility] at [facility] [phone] Meds refilled today.  Return tomorrow for Zepbound follow up.  Return in 1 year for next physical.

## 2022-10-12 NOTE — Assessment & Plan Note (Signed)
Preventative protocols reviewed and updated unless pt declined. Discussed healthy diet and lifestyle.  

## 2022-10-13 ENCOUNTER — Encounter: Payer: Self-pay | Admitting: Family Medicine

## 2022-10-13 ENCOUNTER — Other Ambulatory Visit: Payer: Self-pay

## 2022-10-13 ENCOUNTER — Ambulatory Visit (INDEPENDENT_AMBULATORY_CARE_PROVIDER_SITE_OTHER): Payer: Commercial Managed Care - PPO | Admitting: Family Medicine

## 2022-10-13 VITALS — BP 126/78 | HR 73 | Temp 97.5°F | Ht 68.0 in | Wt 204.2 lb

## 2022-10-13 DIAGNOSIS — E669 Obesity, unspecified: Secondary | ICD-10-CM

## 2022-10-13 DIAGNOSIS — R7989 Other specified abnormal findings of blood chemistry: Secondary | ICD-10-CM | POA: Diagnosis not present

## 2022-10-13 DIAGNOSIS — E559 Vitamin D deficiency, unspecified: Secondary | ICD-10-CM | POA: Diagnosis not present

## 2022-10-13 DIAGNOSIS — D509 Iron deficiency anemia, unspecified: Secondary | ICD-10-CM

## 2022-10-13 NOTE — Progress Notes (Signed)
[name], female    DOB: 02-09-1966, 57 y.o.   MRN: [id number]  This visit was conducted in person.  BP 126/78   Pulse 73   Temp (!) 97.5 F (36.4 C) (Temporal)   Ht 5' 8"$  (1.727 m)   Wt 204 lb 4 oz (92.6 kg)   SpO2 99%   BMI 31.06 kg/m    CC: weight management f/u visit  Subjective:   HPI: Jasmine Willis is a 57 y.o. female presenting on 10/13/2022 for Weight Management (Here for f/u.)   Starting weight: 206 lbs Peak weight: 229 lbs (2020) Last weight: 203 lbs Today's weight 204 lbs  Previously on phentermine 2020-2021 then saxenda titrated to 34m dose but with resultant GI upset and nausea and decreased libido she stopped.  Insurance did not fully cover Contrave.  Phentermine trial again 07/2021, 02/2022.  Zepbound started last month 08/2022. Tolerating well without nausea, diarrhea, constipation or epigastric pain. No fmhx thyroid cancer.   She also continues wellbutrin SR 1040mdaily for mood.  She notes continued sugar craving.   24 hour recall: 11:30am breakfast: 1/2 Pork tenderloin sandwich at work with water and small bottle of pepsi.  3pm lunch: other 1/2 pork tenderloin sandwich with water  7:30pm 2 cups of popcorn with wine cooler 4 oz.   Activity regimen: Very limited due to chronic lower back and R knee pain.   Known knee osteoarthritis and lumbar herniated discs has seen ortho (Dr CaKayleen Memos      Relevant past medical, surgical, family and social history reviewed and updated as indicated. Interim medical history since our last visit reviewed. Allergies and medications reviewed and updated. Outpatient Medications Prior to Visit  Medication Sig Dispense Refill   buPROPion ER (WELLBUTRIN SR) 100 MG 12 hr tablet Take 1 tablet (100 mg total) by mouth daily. 90 tablet 4   diclofenac (VOLTAREN) 75 MG EC tablet Take 1 tablet (75 mg total) by mouth daily. 30 tablet 3   gabapentin (NEURONTIN) 100 MG capsule Take 1 capsule (100 mg total) by  mouth 2 (two) times daily. 180 capsule 3   hydrOXYzine (ATARAX) 25 MG tablet Take 1 tablet (25 mg total) by mouth 2 (two) times daily as needed for anxiety. 30 tablet 1   methocarbamol (ROBAXIN) 500 MG tablet Take 1 tablet (500 mg total) by mouth 2 (two) times daily. 60 tablet 3   tirzepatide (ZEPBOUND) 5 MG/0.5ML Pen Inject 5 mg into the skin once a week. 2 mL 1   No facility-administered medications prior to visit.     Per HPI unless specifically indicated in ROS section below Review of Systems  Objective:  BP 126/78   Pulse 73   Temp (!) 97.5 F (36.4 C) (Temporal)   Ht 5' 8"$  (1.727 m)   Wt 204 lb 4 oz (92.6 kg)   SpO2 99%   BMI 31.06 kg/m   Wt Readings from Last 3 Encounters:  10/13/22 204 lb 4 oz (92.6 kg)  10/12/22 203 lb 8 oz (92.3 kg)  06/17/22 202 lb (91.6 kg)      Physical Exam Vitals and nursing note reviewed.  Constitutional:      Appearance: Normal appearance. She is not ill-appearing.  HENT:     Head: Normocephalic and atraumatic.     Mouth/Throat:     Mouth: Mucous membranes are moist.     Pharynx: Oropharynx is clear. No oropharyngeal exudate or posterior oropharyngeal erythema.  Eyes:  Extraocular Movements: Extraocular movements intact.     Pupils: Pupils are equal, round, and reactive to light.  Neck:     Thyroid: No thyroid mass, thyromegaly or thyroid tenderness.  Cardiovascular:     Rate and Rhythm: Normal rate and regular rhythm.     Pulses: Normal pulses.     Heart sounds: Normal heart sounds. No murmur heard. Pulmonary:     Effort: Pulmonary effort is normal. No respiratory distress.     Breath sounds: Normal breath sounds. No wheezing, rhonchi or rales.  Musculoskeletal:     Right lower leg: No edema.     Left lower leg: No edema.  Skin:    General: Skin is warm and dry.     Findings: No rash.  Neurological:     Mental Status: She is alert.  Psychiatric:        Mood and Affect: Mood normal.        Behavior: Behavior normal.         Assessment & Plan:   Problem List Items Addressed This Visit     Obesity (BMI 30.0-34.9) - Primary    24 hour dietary recall reviewed -encouraged increased fruits/vegetable intake.  Doing well after the first month of zepbound 2.13m weekly  Reviewed mechanism of action of medication as well as side effects and adverse events to watch for including nausea, diarrhea, constipation, pancreatitis. No fmhx medullary thyroid cancer. Discussed titration schedule for medication. Will increase dose to 5469mweekly for the next month, update us[place]ith effect and continue titration as tolerated.  Discussed need for regular visits for weight management to monitor medication effect and tolerance and weight loss, rec return in 6 wks.      IDA (iron deficiency anemia)   Low vitamin B12 level   Vitamin D deficiency     No orders of the defined types were placed in this encounter.   No orders of the defined types were placed in this encounter.   Patient Instructions  Labs today.  Start zepbound 69m67meekly for the next month and update us [place] tolerance, if doing well we can increase to 7.69mg62mxt refill.  Return in 6 weeks for follow up visit.   Follow up plan: Return in about 6 weeks (around 11/24/2022) for follow up visit.  JaviRia Bush

## 2022-10-13 NOTE — Patient Instructions (Addendum)
Labs today.  Start zepbound 30m weekly for the next month and update uKoreawith tolerance, if doing well we can increase to 7.59mnext refill.  Return in 6 weeks for follow up visit.

## 2022-10-13 NOTE — Assessment & Plan Note (Addendum)
24 hour dietary recall reviewed -encouraged increased fruits/vegetable intake.  Doing well after the first month of zepbound 2.63m weekly  Reviewed mechanism of action of medication as well as side effects and adverse events to watch for including nausea, diarrhea, constipation, pancreatitis. No fmhx medullary thyroid cancer. Discussed titration schedule for medication. Will increase dose to 536mweekly for the next month, update us[place]oreaith effect and continue titration as tolerated.  Discussed need for regular visits for weight management to monitor medication effect and tolerance and weight loss, rec return in 6 wks.

## 2022-10-14 LAB — CBC WITH DIFFERENTIAL/PLATELET
Basophils Absolute: 0.1 10*3/uL (ref 0.0–0.1)
Basophils Relative: 1 % (ref 0.0–3.0)
Eosinophils Absolute: 0.2 10*3/uL (ref 0.0–0.7)
Eosinophils Relative: 3.4 % (ref 0.0–5.0)
HCT: 39.1 % (ref 36.0–46.0)
Hemoglobin: 13.3 g/dL (ref 12.0–15.0)
Lymphocytes Relative: 29.8 % (ref 12.0–46.0)
Lymphs Abs: 2.1 10*3/uL (ref 0.7–4.0)
MCHC: 34.1 g/dL (ref 30.0–36.0)
MCV: 84.8 fl (ref 78.0–100.0)
Monocytes Absolute: 0.4 10*3/uL (ref 0.1–1.0)
Monocytes Relative: 5.2 % (ref 3.0–12.0)
Neutro Abs: 4.3 10*3/uL (ref 1.4–7.7)
Neutrophils Relative %: 60.6 % (ref 43.0–77.0)
Platelets: 312 10*3/uL (ref 150.0–400.0)
RBC: 4.6 Mil/uL (ref 3.87–5.11)
RDW: 13.7 % (ref 11.5–15.5)
WBC: 7.1 10*3/uL (ref 4.0–10.5)

## 2022-10-14 LAB — COMPREHENSIVE METABOLIC PANEL
ALT: 20 U/L (ref 0–35)
AST: 15 U/L (ref 0–37)
Albumin: 4.3 g/dL (ref 3.5–5.2)
Alkaline Phosphatase: 89 U/L (ref 39–117)
BUN: 14 mg/dL (ref 6–23)
CO2: 25 mEq/L (ref 19–32)
Calcium: 9.6 mg/dL (ref 8.4–10.5)
Chloride: 105 mEq/L (ref 96–112)
Creatinine, Ser: 0.93 mg/dL (ref 0.40–1.20)
GFR: 69.56 mL/min (ref >60.00)
Glucose, Bld: 91 mg/dL (ref 70–99)
Potassium: 4.4 mEq/L (ref 3.5–5.1)
Sodium: 141 mEq/L (ref 135–145)
Total Bilirubin: 0.5 mg/dL (ref 0.2–1.2)
Total Protein: 6.6 g/dL (ref 6.0–8.3)

## 2022-10-14 LAB — LIPID PANEL
Cholesterol: 182 mg/dL (ref 0–200)
HDL: 42.9 mg/dL (ref >39.00)
LDL Cholesterol: 117 mg/dL — ABNORMAL HIGH (ref 0–99)
NonHDL: 138.83
Total CHOL/HDL Ratio: 4
Triglycerides: 110 mg/dL (ref 0.0–149.0)
VLDL: 22 mg/dL (ref 0.0–40.0)

## 2022-10-14 LAB — VITAMIN D 25 HYDROXY (VIT D DEFICIENCY, FRACTURES): VITD: 26.73 ng/mL — ABNORMAL LOW (ref 30.00–100.00)

## 2022-10-14 LAB — IBC PANEL
Iron: 81 ug/dL (ref 42–145)
Saturation Ratios: 25.9 % (ref 20.0–50.0)
TIBC: 312.2 ug/dL (ref 250.0–450.0)
Transferrin: 223 mg/dL (ref 212.0–360.0)

## 2022-10-14 LAB — VITAMIN B12: Vitamin B-12: 618 pg/mL (ref 211–911)

## 2022-10-14 LAB — FERRITIN: Ferritin: 35.7 ng/mL (ref 10.0–291.0)

## 2022-10-15 ENCOUNTER — Other Ambulatory Visit: Payer: Self-pay

## 2022-10-16 ENCOUNTER — Encounter: Payer: Self-pay | Admitting: Family Medicine

## 2022-10-16 ENCOUNTER — Other Ambulatory Visit (INDEPENDENT_AMBULATORY_CARE_PROVIDER_SITE_OTHER): Payer: Commercial Managed Care - PPO | Admitting: *Deleted

## 2022-10-16 DIAGNOSIS — Z1211 Encounter for screening for malignant neoplasm of colon: Secondary | ICD-10-CM | POA: Diagnosis not present

## 2022-10-16 LAB — FECAL OCCULT BLOOD, IMMUNOCHEMICAL: Fecal Occult Bld: NEGATIVE

## 2022-10-21 ENCOUNTER — Telehealth: Payer: Self-pay | Admitting: Family Medicine

## 2022-10-21 DIAGNOSIS — R110 Nausea: Secondary | ICD-10-CM

## 2022-10-21 NOTE — Telephone Encounter (Signed)
Patient returned phone call from 10/19/2022 regarding lab results,would like a call back

## 2022-10-21 NOTE — Telephone Encounter (Signed)
Called patient back and she said the new diet medication she started taking is causing her to be nauseated and burp a lot. Patient wants to know if this is a side effect.  Patient stated that [facility] is going to stop covering the medication soon so she will probably not be taking it. Patient stated that he can send response thru mychart.

## 2022-10-23 ENCOUNTER — Other Ambulatory Visit: Payer: Self-pay

## 2022-10-23 MED ORDER — ONDANSETRON HCL 4 MG PO TABS
4.0000 mg | ORAL_TABLET | Freq: Three times a day (TID) | ORAL | 0 refills | Status: DC | PRN
  Filled 2022-10-23: qty 20, 7d supply, fill #0

## 2022-10-23 MED ORDER — ONDANSETRON HCL 4 MG PO TABS: 4.0000 mg | ORAL_TABLET | Freq: Three times a day (TID) | ORAL | 0 refills | Status: DC | PRN

## 2022-10-23 NOTE — Telephone Encounter (Addendum)
Recommend stop zepbound.  I've sent zofran to [facility]  pharmacy to take PRN nausea. Would hold pepto bismol at this time. She can try GasX for gas.  If ongoing black stools after stopping pepto bismol, or if any chest pain, dizziness, shortness of breath, rec evaluation wherever she is.  Recent iFOB normal.

## 2022-10-23 NOTE — Telephone Encounter (Signed)
Pt called back asking for status of message sent to Dr. Darnell Level on her behalf. Pt states she's still experiencing a lot of black stool, burping & nausea from tirzepatide (ZEPBOUND) 5 MG/0.5ML Pen. Transferred pt to access nurse. Call back # [phone]

## 2022-10-23 NOTE — Telephone Encounter (Signed)
I spoke with pt; pt said she started Zepbound 500 on 10/17/22; pt said when she was on Zepbound 250 no symptoms noted but on 10/20/22 pt started with a lot of nausea and diarrhea; no vomiting. No fever.Pt said she noted black tarry loose stools prior to taking Pepto bismol. Pt said at night she has lower abd pain that is sharp and only has this pain prior to episode of diarrhea. Pt is also burping a lot. Pt said after gets out all stool then symptoms subside. Pt said last loose and watery tarry diarrhea was 30 mins ago. Pt is out of town until 10/27/22. Pt said there is an ED near where she is but she does not think she needs to go to ED and wants cb after Dr Darnell Level reviews this note. UC & ED precautions given and pt voiced understanding. Sending note to Dr Darnell Level and G pool and will teams [place] CMA.

## 2022-10-23 NOTE — Telephone Encounter (Signed)
Spoke with pt relaying Dr. Synthia Innocent message. Pt verbalizes understanding. However, requests Zofran rx be sent to:  [facility] [address], [place]  [address] P: [phone] F: [phone]  Added pharmacy to pt's chart and e-scribed refill. Pt expresses her thanks.

## 2022-10-23 NOTE — Addendum Note (Signed)
Addended by: Brenton Grills on: [id number] PM   Modules accepted: Orders

## 2022-10-23 NOTE — Telephone Encounter (Signed)
[facility] - Client TELEPHONE ADVICE RECORD [facility] Patient Name: Jasmine Willis Gender: Female DOB: 09/07/1965 Age: 57 Y 9 M 12 D Return Phone Number: [phone] (Primary), [phone] (Secondary) Address: City/ State/ Zip: [place] [address] Client [facility] - Client Client Site [place] Provider Ria Bush - MD Contact Type Call Who Is Calling Patient / Member / Family / Caregiver Call Type Triage / Clinical Relationship To Patient Self Return Phone Number [phone] (Primary) Chief Complaint Medication reaction Reason for Call Symptomatic / Request for [place] states she is on a new medication for weight loss, she is having nausea and having black stool. Translation No Nurse Assessment Nurse: Kathi Ludwig, RN, Leana Roe Date/Time (Eastern Time): 10/23/2022 9:08:17 AM Confirm and document reason for call. If symptomatic, describe symptoms. ---Caller states she is on a new medication for weight loss ( Zepbound), she is having nausea and having black stool. taking pepto. diarrhea x 5 per day Does the patient have any new or worsening symptoms? ---Yes Will a triage be completed? ---Yes Related visit to physician within the last 2 weeks? ---No Does the PT have any chronic conditions? (i.e. diabetes, asthma, this includes High risk factors for pregnancy, etc.) ---No Is the patient pregnant or possibly pregnant? (Ask all females between the ages of 17-55) ---No Is this a behavioral health or substance abuse call? ---No Guidelines Guideline Title Affirmed Question Affirmed Notes Nurse Date/Time (Eastern Time) Diarrhea [1] MODERATE diarrhea (e.g., 4-6 times / day more than normal) AND [2] present > 48 hours (2 days) Kathi Ludwig, Therapist, sports, Tracie 10/23/2022 9:12:08 AM Disp. Time Eilene Ghazi Time) Disposition Final User 10/23/2022 9:17:31 AM See PCP  within 24 Hours Yes Kathi Ludwig, RN, Leana Roe PLEASE NOTE: All timestamps contained within this report are represented as [place] Standard Time. CONFIDENTIALTY NOTICE: This fax transmission is intended only for the addressee. It contains information that is legally privileged, confidential or otherwise protected from use or disclosure. If you are not the intended recipient, you are strictly prohibited from reviewing, disclosing, copying using or disseminating any of this information or taking any action in reliance on or regarding this information. If you have received this fax in error, please notify us immediately by telephone so that we can arrange for its return to [place]. Phone: [phone], Toll-Free: [phone], Fax: [phone] Page: 2 of 2 Call Id: [id number] Final Disposition 10/23/2022 9:17:31 AM See PCP within 24 Hours Yes Kathi Ludwig, RN, Leana Roe Caller Disagree/Comply Comply Caller Understands Yes PreDisposition Call Doctor Care Advice Given Per Guideline SEE PCP WITHIN 24 HOURS: * IF OFFICE WILL BE OPEN: You need to be examined within the next 24 hours. Call your doctor (or NP/PA) when the office opens and make an appointment. FLUID THERAPY DURING MILD TO MODERATE DIARRHEA: * Drink more fluids, at least 8 to 10 cups daily. One cup equals 8 oz (240 ml). * WATER: For mild to moderate diarrhea, water is often the best liquid to drink. You should also eat some salty foods (e.g., potato chips, pretzels, saltine crackers). This is important to make sure you are getting enough salt, sugars, and fluids to meet your body's needs. * SPORTS DRINKS: You can also drink half-strength sports drinks (e.g., Gatorade, Powerade) to help treat and prevent dehydration. Mix the sports drink half and half with water. * AVOID caffeinated beverages. Reason: Caffeine is mildly dehydrating. * AVOID alcohol beverages (e.g., beer, wine, hard liquor). * AVOID carbonated  soft drinks (soda) as these can make your diarrhea  worse. FOOD AND NUTRITION DURING MILD TO MODERATE DIARRHEA: * Maintaining some food intake during episodes of diarrhea is important. * AVOID greasy, fatty or spicy foods. * AVOID milk and dairy products if these make your diarrhea worse. * Begin with boiled starches / cereals (e.g., potatoes, rice, noodles, wheat, oats) with a small amount of salt to taste. * You can also eat bananas, yogurt, crackers, and soup. * Do not use for more than 2 days. * Adult dosage: Take two Pepto-Bismol caplets or tablets, or take two tablespoons (30 ml total) of Pepto-Bismol original strength liquid, by mouth every hour (if diarrhea continues) to a maximum of 8 doses in a 24 hour period. DIARRHEA MEDICINE - BISMUTH SUBSALICYLATE (E.G., PEPTO-BISMOL): [place] YOUR HANDS: * Wash your hands after using the bathroom. * You become worse * Constant or severe abdomen pain * Bloody stools * Signs of dehydration occur (e.g., no urine over 12 hours, very dry mouth, lightheaded, etc.) CALL BACK IF: CARE ADVICE given per Diarrhea (Adult) guideline. Comments User: Estevan Ryder, RN Date/Time Eilene Ghazi Time): 10/23/2022 9:14:09 AM took injection on Sunday User: Estevan Ryder, RN Date/Time Eilene Ghazi Time): 10/23/2022 9:16:56 AM [email or web address]://[email or web address].com/sfx/zepbound-side-effects.[email or web address] User: Estevan Ryder, RN Date/Time Eilene Ghazi Time): 10/23/2022 9:18:51 AM out of town, back on Tuesday. would like to speak to Dr Danise Mina or his nurse please Referrals REFERRED TO PCP OFFIC

## 2022-10-23 NOTE — Addendum Note (Signed)
Addended by: Ria Bush on: 10/23/2022 10:15 AM   Modules accepted: Orders

## 2022-10-30 ENCOUNTER — Other Ambulatory Visit: Payer: Self-pay

## 2022-11-10 ENCOUNTER — Other Ambulatory Visit: Payer: Self-pay

## 2022-11-11 ENCOUNTER — Encounter: Payer: Self-pay | Admitting: Family Medicine

## 2022-11-11 ENCOUNTER — Ambulatory Visit (INDEPENDENT_AMBULATORY_CARE_PROVIDER_SITE_OTHER)
Admission: RE | Admit: 2022-11-11 | Discharge: 2022-11-11 | Disposition: A | Discharge status: Home / Self Care | Payer: Commercial Managed Care - PPO | Source: Ambulatory Visit | Attending: Family Medicine | Admitting: Family Medicine

## 2022-11-11 ENCOUNTER — Other Ambulatory Visit: Payer: Self-pay

## 2022-11-11 ENCOUNTER — Ambulatory Visit: Payer: Commercial Managed Care - PPO | Admitting: Family Medicine

## 2022-11-11 VITALS — BP 120/68 | HR 83 | Temp 97.1°F | Ht 68.0 in | Wt 207.1 lb

## 2022-11-11 DIAGNOSIS — W19XXXA Unspecified fall, initial encounter: Secondary | ICD-10-CM | POA: Diagnosis not present

## 2022-11-11 DIAGNOSIS — M1711 Unilateral primary osteoarthritis, right knee: Secondary | ICD-10-CM | POA: Diagnosis not present

## 2022-11-11 DIAGNOSIS — R0789 Other chest pain: Secondary | ICD-10-CM | POA: Diagnosis not present

## 2022-11-11 DIAGNOSIS — S299XXA Unspecified injury of thorax, initial encounter: Secondary | ICD-10-CM | POA: Diagnosis not present

## 2022-11-11 DIAGNOSIS — S86911A Strain of unspecified muscle(s) and tendon(s) at lower leg level, right leg, initial encounter: Secondary | ICD-10-CM | POA: Insufficient documentation

## 2022-11-11 MED ORDER — TRAMADOL HCL 50 MG PO TABS
50.0000 mg | ORAL_TABLET | Freq: Three times a day (TID) | ORAL | 0 refills | Status: DC | PRN
  Filled 2022-11-11: qty 20, 7d supply, fill #0

## 2022-11-11 NOTE — Assessment & Plan Note (Signed)
Fall suffered last week, with resultant injury to R knee with abrasion as well as R upper chest at ribcage. No injury to shoulder or to breast.  Check CXR and R rib series r/o rib fracture.  Supportive measures reviewed including ice/heat, NSAID, continue muscle relaxant. Rx tramadol for breakthrough pain at night, don't take with hydroxyzine.

## 2022-11-11 NOTE — Progress Notes (Signed)
[name], female    DOB: [birth year], 57 y.o.   MRN: [id number]  This visit was conducted in person.  BP 120/68   Pulse 83   Temp (!) 97.1 F (36.2 C) (Temporal)   Ht 5\' 8"  (1.727 m)   Wt 207 lb 2 oz (94 kg)   SpO2 97%   BMI 31.49 kg/m    CC: fall at home  Subjective:   HPI: Jasmine Willis is a 57 y.o. female presenting on 11/11/2022 for Fall (Pt fell at home tripping over pant leg. Injured chest, R knee and back. )   DOI: 11/06/2022 Tripped over pant leg - landed on right chest and R knee onto bench arm rail, fell down several steps and landed onto back.   More pain when laying on right chest wall with radiation to back shoulder blade, as well as shortness of breath, worse when laying flat on back.  Having to wear bra at night for extra support and to help decrease pain. Scraped R knee with abrasion.   Treating with tylenol and ibuprofen and robaxin PRN.   Notes FROM at shoulder  No head or arm injury. No breast injury, bruising or new breast mass  Zepbound caused dark loose stools and nausea - she stopped this (had also been taking pepto bismol). Took zofran for nausea. GI symptoms are better.     Relevant past medical, surgical, family and social history reviewed and updated as indicated. Interim medical history since our last visit reviewed. Allergies and medications reviewed and updated. Outpatient Medications Prior to Visit  Medication Sig Dispense Refill   buPROPion ER (WELLBUTRIN SR) 100 MG 12 hr tablet Take 1 tablet (100 mg total) by mouth daily. 90 tablet 4   diclofenac (VOLTAREN) 75 MG EC tablet Take 1 tablet (75 mg total) by mouth daily. 30 tablet 3   gabapentin (NEURONTIN) 100 MG capsule Take 1 capsule (100 mg total) by mouth 2 (two) times daily. 180 capsule 3   hydrOXYzine (ATARAX) 25 MG tablet Take 1 tablet (25 mg total) by mouth 2 (two) times daily as needed for anxiety. 30 tablet 1   methocarbamol (ROBAXIN) 500 MG tablet Take 1  tablet (500 mg total) by mouth 2 (two) times daily. 60 tablet 3   ondansetron (ZOFRAN) 4 MG tablet Take 1 tablet (4 mg total) by mouth every 8 (eight) hours as needed for nausea or vomiting. 20 tablet 0   tirzepatide (ZEPBOUND) 5 MG/0.5ML Pen Inject 5 mg into the skin once a week. 2 mL 1   No facility-administered medications prior to visit.     Per HPI unless specifically indicated in ROS section below Review of Systems  Objective:  BP 120/68   Pulse 83   Temp (!) 97.1 F (36.2 C) (Temporal)   Ht 5\' 8"  (1.727 m)   Wt 207 lb 2 oz (94 kg)   SpO2 97%   BMI 31.49 kg/m   Wt Readings from Last 3 Encounters:  11/11/22 207 lb 2 oz (94 kg)  10/13/22 204 lb 4 oz (92.6 kg)  10/12/22 203 lb 8 oz (92.3 kg)      Physical Exam Vitals and nursing note reviewed.  Constitutional:      Appearance: Normal appearance. She is not ill-appearing.  HENT:     Head: Normocephalic and atraumatic.     Mouth/Throat:     Mouth: Mucous membranes are moist.     Pharynx: Oropharynx is clear. No oropharyngeal  exudate or posterior oropharyngeal erythema.  Eyes:     Extraocular Movements: Extraocular movements intact.     Pupils: Pupils are equal, round, and reactive to light.  Cardiovascular:     Rate and Rhythm: Normal rate and regular rhythm.     Pulses: Normal pulses.     Heart sounds: Normal heart sounds. No murmur heard. Pulmonary:     Effort: Pulmonary effort is normal. No respiratory distress.     Breath sounds: Normal breath sounds. No wheezing, rhonchi or rales.  Chest:     Chest wall: Tenderness present.       Comments: Bruising with small hematoma to right anterior upper ribcage, point tender to palpation Musculoskeletal:        General: Tenderness present.     Right lower leg: No edema.     Left lower leg: No edema.     Comments:  FROM at shoulders FROM at bilateral knees in flexion/extension, crepitus with full extension of right knee Effusion present  No popliteal fullness. Neg  drawer test. Neg mcmurray test. No pain with valgus/varus stress. No ligament laxity with testing of ACL, PCL, MCL, LCL.  Lymphadenopathy:     Upper Body:     Right upper body: No supraclavicular adenopathy.     Left upper body: No supraclavicular adenopathy.  Skin:    General: Skin is warm and dry.     Findings: No rash.     Comments: Abrasions to right knee  Neurological:     Mental Status: She is alert.  Psychiatric:        Mood and Affect: Mood normal.        Behavior: Behavior normal.        Assessment & Plan:   Problem List Items Addressed This Visit     Primary osteoarthritis of right knee    Acutely worse after recent fall.       Relevant Medications   traMADol (ULTRAM) 50 MG tablet   Fall with injury - Primary    Fall suffered last week, with resultant injury to R knee with abrasion as well as R upper chest at ribcage. No injury to shoulder or to breast.  Check CXR and R rib series r/o rib fracture.  Supportive measures reviewed including ice/heat, NSAID, continue muscle relaxant. Rx tramadol for breakthrough pain at night, don't take with hydroxyzine.       Rib injury    Check CXR/rib series r/o fracture. Supportive measures reviewed as per instructions.  Recommend carry pillow/cushion to act as buffer.       Relevant Orders   DG Ribs Unilateral W/Chest Right   Knee strain, right, initial encounter    Knee strain with abrasion after fall. Supportive measures recommended.         Meds ordered this encounter  Medications   traMADol (ULTRAM) 50 MG tablet    Sig: Take 1 tablet (50 mg total) by mouth every 8 (eight) hours as needed for moderate pain (sedation pecautions).    Dispense:  20 tablet    Refill:  0    Orders Placed This Encounter  Procedures   DG Ribs Unilateral W/Chest Right    Standing Status:   Future    Number of Occurrences:   1    Standing Expiration Date:   05/14/2023    Order Specific Question:   Reason for Exam (SYMPTOM  OR  DIAGNOSIS REQUIRED)    Answer:   right chest wall pain after fall with hematoma  and bruis to R upper ribcage    Order Specific Question:   Is the patient pregnant?    Answer:   No    Order Specific Question:   Preferred imaging location?    Answer:   [facility]    Patient Instructions  Xrays today  Continue tylenol, diclofenac, ice with transition to heating pad over next few weeks.  May use robaxin for muscle spasm, may use tramadol for breakthrough pain at night time. Don't mix tramadol with robaxin or hydroxyzine as all are sedating.  Let us know if not improving with this.   Follow up plan: Return if symptoms worsen or fail to improve.  Ria Bush, MD

## 2022-11-11 NOTE — Assessment & Plan Note (Addendum)
Check CXR/rib series r/o fracture. Supportive measures reviewed as per instructions.  Recommend carry pillow/cushion to act as buffer.

## 2022-11-11 NOTE — Assessment & Plan Note (Addendum)
Acutely worse after recent fall.

## 2022-11-11 NOTE — Assessment & Plan Note (Signed)
Knee strain with abrasion after fall. Supportive measures recommended.

## 2022-11-11 NOTE — Patient Instructions (Addendum)
Xrays today  Continue tylenol, diclofenac, ice with transition to heating pad over next few weeks.  May use robaxin for muscle spasm, may use tramadol for breakthrough pain at night time. Don't mix tramadol with robaxin or hydroxyzine as all are sedating.  Let us know if not improving with this.

## 2022-12-04 ENCOUNTER — Encounter: Payer: Self-pay | Admitting: Family Medicine

## 2022-12-04 DIAGNOSIS — E669 Obesity, unspecified: Secondary | ICD-10-CM

## 2022-12-04 DIAGNOSIS — E66811 Obesity, class 1: Secondary | ICD-10-CM

## 2022-12-11 ENCOUNTER — Other Ambulatory Visit: Payer: Self-pay

## 2022-12-11 MED ORDER — PHENTERMINE HCL 30 MG PO CAPS
30.0000 mg | ORAL_CAPSULE | ORAL | 0 refills | Status: DC
  Filled 2022-12-11: qty 30, 30d supply, fill #0

## 2022-12-11 NOTE — Addendum Note (Signed)
Addended by: Eustaquio Boyden on: 12/11/2022 07:18 AM   Modules accepted: Orders

## 2022-12-17 ENCOUNTER — Other Ambulatory Visit: Payer: Self-pay

## 2022-12-30 ENCOUNTER — Other Ambulatory Visit: Payer: Self-pay | Admitting: Family Medicine

## 2022-12-30 DIAGNOSIS — M1711 Unilateral primary osteoarthritis, right knee: Secondary | ICD-10-CM

## 2022-12-30 DIAGNOSIS — M13861 Other specified arthritis, right knee: Secondary | ICD-10-CM | POA: Diagnosis not present

## 2022-12-30 NOTE — Telephone Encounter (Signed)
Name of Medication: Tramadol Name of Pharmacy: [facility] Last Fill or Written Date and Quantity: 11/11/22, #20 Last Office Visit and Type: 11/11/22, fall Next Office Visit and Type: none Last Controlled Substance Agreement Date: none Last UDS: none

## 2022-12-31 ENCOUNTER — Other Ambulatory Visit: Payer: Self-pay

## 2022-12-31 ENCOUNTER — Other Ambulatory Visit: Payer: Self-pay | Admitting: Family Medicine

## 2023-01-01 ENCOUNTER — Other Ambulatory Visit: Payer: Self-pay

## 2023-01-01 MED FILL — Tramadol HCl Tab 50 MG: ORAL | 7 days supply | Qty: 20 | Fill #0 | Status: AC

## 2023-01-01 NOTE — Telephone Encounter (Signed)
Duplicate request (see 12/30/22 refill note)

## 2023-01-01 NOTE — Telephone Encounter (Signed)
ERx 

## 2023-01-11 ENCOUNTER — Other Ambulatory Visit: Payer: Self-pay | Admitting: Family Medicine

## 2023-01-11 ENCOUNTER — Other Ambulatory Visit: Payer: Self-pay

## 2023-01-11 MED ORDER — PHENTERMINE HCL 30 MG PO CAPS
30.0000 mg | ORAL_CAPSULE | ORAL | 0 refills | Status: DC
  Filled 2023-01-11: qty 30, 30d supply, fill #0

## 2023-01-11 NOTE — Telephone Encounter (Signed)
Refill request Phentermine Last office visit 11/11/22 Last refill 12/11/22

## 2023-01-11 NOTE — Telephone Encounter (Signed)
ERx 

## 2023-01-12 ENCOUNTER — Other Ambulatory Visit: Payer: Self-pay

## 2023-01-27 ENCOUNTER — Other Ambulatory Visit: Payer: Self-pay

## 2023-01-27 ENCOUNTER — Other Ambulatory Visit: Payer: Self-pay | Admitting: Family Medicine

## 2023-01-27 DIAGNOSIS — M1711 Unilateral primary osteoarthritis, right knee: Secondary | ICD-10-CM

## 2023-01-27 DIAGNOSIS — R110 Nausea: Secondary | ICD-10-CM

## 2023-01-27 DIAGNOSIS — E669 Obesity, unspecified: Secondary | ICD-10-CM

## 2023-01-27 MED ORDER — ONDANSETRON HCL 4 MG PO TABS
4.0000 mg | ORAL_TABLET | Freq: Three times a day (TID) | ORAL | 0 refills | Status: DC | PRN
  Filled 2023-01-27: qty 20, 7d supply, fill #0

## 2023-01-27 NOTE — Telephone Encounter (Signed)
Name of Medication: Phentermine, Tramadol Name of Pharmacy: [facility] Last Fill or Written Date and Quantity:       Phentermine- 01/13/23, #30      Tramadol- 01/06/23, #20 Last Office Visit and Type: 11/11/22, fall Next Office Visit and Type: none Last Controlled Substance Agreement Date: none Last UDS: none

## 2023-01-27 NOTE — Telephone Encounter (Signed)
ERx 

## 2023-01-27 NOTE — Telephone Encounter (Signed)
Zofran Last rx:  10/23/22, #20 Last OV:  11/11/22, fall Next OV:  none

## 2023-01-28 ENCOUNTER — Other Ambulatory Visit: Payer: Self-pay | Admitting: Family Medicine

## 2023-01-28 ENCOUNTER — Other Ambulatory Visit: Payer: Self-pay

## 2023-01-28 NOTE — Telephone Encounter (Signed)
Duplicate request. (See 01/27/23 refill note.)

## 2023-01-29 ENCOUNTER — Other Ambulatory Visit: Payer: Self-pay

## 2023-01-29 ENCOUNTER — Ambulatory Visit
Admission: RE | Admit: 2023-01-29 | Discharge: 2023-01-29 | Disposition: A | Discharge status: Home / Self Care | Payer: Commercial Managed Care - PPO | Source: Ambulatory Visit | Attending: Family Medicine | Admitting: Family Medicine

## 2023-01-29 DIAGNOSIS — Z1231 Encounter for screening mammogram for malignant neoplasm of breast: Secondary | ICD-10-CM | POA: Diagnosis not present

## 2023-01-31 ENCOUNTER — Other Ambulatory Visit: Payer: Self-pay

## 2023-01-31 MED FILL — Tramadol HCl Tab 50 MG: ORAL | 7 days supply | Qty: 20 | Fill #0 | Status: AC

## 2023-01-31 MED FILL — Phentermine HCl Cap 30 MG: ORAL | 30 days supply | Qty: 30 | Fill #0 | Status: CN

## 2023-01-31 NOTE — Telephone Encounter (Signed)
ERx 

## 2023-02-01 ENCOUNTER — Other Ambulatory Visit: Payer: Self-pay

## 2023-02-11 ENCOUNTER — Other Ambulatory Visit: Payer: Self-pay

## 2023-02-11 MED FILL — Phentermine HCl Cap 30 MG: ORAL | 30 days supply | Qty: 30 | Fill #0 | Status: AC

## 2023-02-12 ENCOUNTER — Other Ambulatory Visit: Payer: Self-pay

## 2023-02-15 ENCOUNTER — Other Ambulatory Visit: Payer: Self-pay

## 2023-02-15 MED ORDER — HYDROCORTISONE ACETATE 25 MG RE SUPP
25.0000 mg | Freq: Two times a day (BID) | RECTAL | 0 refills | Status: DC | PRN
  Filled 2023-02-15: qty 12, 6d supply, fill #0

## 2023-02-15 MED ORDER — DIBUCAINE 1 % EX OINT
1.0000 | TOPICAL_OINTMENT | Freq: Four times a day (QID) | CUTANEOUS | 0 refills | Status: DC | PRN
  Filled 2023-02-18: qty 28.35, fill #0
  Filled 2023-02-24: qty 28, 15d supply, fill #0

## 2023-02-15 NOTE — Therapy (Deleted)
OUTPATIENT PHYSICAL THERAPY EVALUATION   Patient Name: Jasmine Willis MRN: [id number] DOB:1966-08-21, 57 y.o., female Today's Date: 02/15/2023  END OF SESSION:   Past Medical History:  Diagnosis Date   Anxiety    Primary osteoarthritis of right knee 06/23/2018   R knee xray 05/2016 - Mild joint space narrowing in the medial compartment, mild subchondral sclerosis in the medial tibial plateau   Past Surgical History:  Procedure Laterality Date   NO PAST SURGERIES     Patient Active Problem List   Diagnosis Date Noted   Fall with injury 11/11/2022   Rib injury 11/11/2022   Knee strain, right, initial encounter 11/11/2022   Numbness and tingling in right hand 06/17/2022   Left foot pain 02/25/2022   Sea sickness 05/07/2021   Chronic pain of right knee 11/11/2020   Pain, dental 11/11/2020   Low vitamin B12 level 03/10/2019   Vitamin D deficiency 03/10/2019   Fatigue 06/23/2018   Primary osteoarthritis of right knee 06/23/2018   IDA (iron deficiency anemia) 06/23/2018   Health maintenance examination 08/09/2015   Obesity (BMI 30.0-34.9) 08/09/2015   Depression with anxiety     PCP: Eustaquio Boyden, MD  REFERRING PROVIDER: Juanell Fairly, MD  REFERRING DIAG: other specified arthritis of right knee  THERAPY DIAG:  No diagnosis found.  Rationale for Evaluation and Treatment: Rehabilitation  ONSET DATE: ***  SUBJECTIVE:   SUBJECTIVE STATEMENT: ***  PERTINENT HISTORY: Patient is a 57 y.o. female who presents to outpatient physical therapy with a referral for medical diagnosis other specified arthritis of right knee. This patient's chief complaints consist of ***, leading to the following functional deficits: ***. Relevant past medical history and comorbidities include Obesity (BMI 30.0-34.9); Depression with anxiety; Fatigue; Primary osteoarthritis of right knee; IDA (iron deficiency anemia); Low vitamin B12 level; Vitamin D deficiency; Chronic pain of right  knee; Pain, dental; Sea sickness; Left foot pain; Numbness and tingling in right hand; Fall with injury; Rib injury; and Knee strain, right. Patient denies hx of {redflags:27294}  PAIN:  Are you having pain? Yes: NPRS scale: Current: ***/10,  Best: ***/10, Worst: ***/10. Pain location: *** Pain description: *** Aggravating factors: *** Relieving factors: ***   FUNCTIONAL LIMITATIONS: ***  LEISURE: ***   PRECAUTIONS: {Therapy precautions:24002}  WEIGHT BEARING RESTRICTIONS: {Yes ***/No:24003}  FALLS:  Has patient fallen in last 6 months? {fallsyesno:27318}  LIVING ENVIRONMENT: Lives with: {[facility] lives with:25569::"lives with their family"} Lives in: {Lives in:25570} Stairs: {opstairs:27293} Has following equipment at home: {Assistive devices:23999}  OCCUPATION: ***  PLOF: {PLOF:24004}  PATIENT GOALS: ***  NEXT MD VISIT: ***   OBJECTIVE  DIAGNOSTIC FINDINGS:  R knee xray report from 11/25/2021: "Bone-on-bone medial compartment joint space narrowing."  SELF- REPORTED FUNCTION FOTO score: ***/100 (knee questionnaire)  OBSERVATION/INSPECTION Posture Posture (seated): forward head, rounded shoulders, slumped in sitting.  Posture (standing): *** Posture correction: *** Anthropometrics Tremor: none Body composition: *** Muscle bulk: *** Skin: The incision sites appear to be healing well with no excessive redness, warmth, drainage or signs of infection present.  *** Edema: *** Functional Mobility Bed mobility: *** Transfers: *** Gait: grossly WFL for household and short community ambulation. More detailed gait analysis deferred to later date as needed. *** Stairs: ***  SPINE MOTION  LUMBAR SPINE AROM *Indicates pain Flexion: *** Extension: *** Side Flexion:   R ***  L *** Rotation:  R *** L *** Side glide:  R *** L ***   NEUROLOGICAL  Upper Motor Neuron Screen Babinski, Hoffman's and  Clonus (ankle) negative bilaterally.  Dermatomes C2-T1 appears  equal and intact to light touch except the following: *** L2-S2 appears equal and intact to light touch except the following: *** Deep Tendon Reflexes R/L  ***+/***+ Biceps brachii reflex (C5, C6) ***+/***+ Brachioradialis reflex (C6) ***+/***+ Triceps brachii reflex (C7) ***+/***+ Quadriceps reflex (L4) ***+/***+ Achilles reflex (S1)  SPINE MOTION  CERVICAL SPINE AROM *Indicates pain Flexion: *** Extension: *** Side Flexion:   R ***  L *** Rotation:  R *** L ***   PERIPHERAL JOINT MOTION (in degrees)  ACTIVE RANGE OF MOTION (AROM) *Indicates pain Date Date Date  Joint/Motion R/L R/L R/L  Shoulder     Flexion / / /  Extension / / /  Abduction  / / /  External rotation / / /  Internal rotation / / /  Elbow     Flexion  / / /  Extension  / / /  Wrist     Flexion / / /  Extension  / / /  Radial deviation / / /  Ulnar deviation / / /  Pronation / / /  Supination / / /  Hip     Flexion / / /  Extension  / / /  Abduction / / /  Adduction / / /  External rotation / / /  Internal rotation  / / /  Knee     Extension / / /  Flexoin / / /  Ankle/Foot     Dorsiflexion (knee ext) / / /  Dorsiflexion (knee flex) / / /  Plantarflexion / / /  Everison / / /  Inversion / / /  Great toe extension / / /  Great toe flexion / / /  Comments:   PASSIVE RANGE OF MOTION (PROM) *Indicates pain Date Date Date  Joint/Motion R/L R/L R/L  Shoulder     Flexion / / /  Extension / / /  Abduction  / / /  External rotation / / /  Internal rotation / / /  Elbow     Flexion  / / /  Extension  / / /  Wrist     Flexion / / /  Extension  / / /  Radial deviation / / /  Ulnar deviation / / /  Pronation / / /  Supination / / /  Hip     Flexion  / / /  Extension  / / /  Abduction / / /  Adduction / / /  External rotation / / /  Internal rotation  / / /  Knee     Extension / / /  Flexion / / /  Ankle/Foot     Dorsiflexion (knee ext) / / /  Dorsiflexion (knee flex) /  / /  Plantarflexion / / /  Everison / / /  Inversion / / /  Great toe extension / / /  Great toe flexion / / /  Comments:   MUSCLE PERFORMANCE (MMT):  *Indicates pain Date Date Date  Joint/Motion R/L R/L R/L  Shoulder     Flexion / / /  Abduction (C5) / / /  External rotation / / /  Internal rotation / / /  Extension / / /  Elbow     Flexion (C6) / / /  Extension (C7) / / /  Wrist     Flexion (C7) / / /  Extension (C6) / / /  Radial deviation / / /  Ulnar deviation (C8) / / /  Pronation / / /  Supination / / /  Hand     Thumb extension (C8) / / /  Finger abduction (T1) / / /  Grip (C8) / / /  Hip     Flexion (L1, L2) / / /  Extension (knee ext) / / /  Extension (knee flex) / / /  Abduction / / /  Adduction / / /  External rotation / / /  Internal rotation  / / /  Knee     Extension (L3) / / /  Flexion (S2) / / /  Ankle/Foot     Dorsiflexion (L4) / / /  Great toe extension (L5) / / /  Eversion (S1) / / /  Plantarflexion (S1) / / /  Inversion / / /  Pronation / / /  Great toe flexion / / /  Comments:   SPECIAL TESTS:  .Neurodynamictests .NeurodynamicUE .NeurodynamicLE .CspineInstability .CSPINESPECIALTESTS .SHOULDERSPECIALTESTCLUSTERS .HIPSPECIALTESTS .SIJSPECIALTESTS   SHOULDER SPECIAL TESTS RTC, Impingement, Anterior Instability (macrotrauma), Labral Tear: Painful arc test: R = ***, L = ***. Drop arm test: R = ***, L = ***. Hawkins-Kennedy test: R = ***, L = ***. Infraspinatus test: R = ***, L = ***. Apprehension test: R = ***, L = ***. Relocation test: R = ***, L = ***. Active compression test: R = ***, L = ***.  ACCESSORY MOTION: ***  PALPATION: ***  SUSTAINED POSITIONS TESTING:  ***  REPEATED MOTIONS TESTING: ***  FUNCTIONAL/BALANCE TESTS: Five Time Sit to Stand (5TSTS): *** seconds Functional Gait Assessment (FGA): ***/30 (see details above) Ten meter walking trial ( ): *** m/s Six Minute Walk Test ( ): ***  feet Timed Up and Go (TUG): *** seconds   Dynamic Gait Index: ***/24 BERG Balance Scale: ***/56 Tinetti/POMA: ***/28 Timed Up and GO: *** seconds (average of 3 trials) Trial 1: *** Trial 2: *** Trial 3: *** Romberg test: -Narrow stance, eyes open: *** seconds -Narrow stance, eyes closed: *** seconds Sharpened Romberg test: -Tandem stance, eyes open: *** seconds -Tandem stance, eyes closed: *** seconds  Narrow stance, firm surface, eyes open: *** seconds Narrow stance, firm surface, eyes closed: *** seconds Narrow stance, compliant surface, eyes open: *** seconds Narrow stance, compliant surface, eyes closed: *** seconds Single leg stance, firm surface, eyes open: R= *** seconds, L= *** seconds Single leg stance, compliant surface, eyes open: R= *** seconds, L= *** seconds Gait speed: *** m/s Functional reach test: *** inches    TODAY'S TREATMENT:      PATIENT EDUCATION:  Education details: *** Person educated: {Person educated:25204} Education method: {Education Method:25205} Education comprehension: {Education Comprehension:25206}  HOME EXERCISE PROGRAM: ***  ASSESSMENT:  CLINICAL IMPRESSION: Patient is a 57 y.o. female referred to outpatient physical therapy with a medical diagnosis of other specified arthritis of right knee who presents with signs and symptoms consistent with ***. Patient presents with significant *** impairments that are limiting ability to complete *** without difficulty. Patient will benefit from skilled physical therapy intervention to address current body structure impairments and activity limitations to improve function and work towards goals set in current POC in order to return to prior level of function or maximal functional improvement.    OBJECTIVE IMPAIRMENTS: {opptimpairments:25111}.   ACTIVITY LIMITATIONS: {activitylimitations:27494}  PARTICIPATION LIMITATIONS: {participationrestrictions:25113}  PERSONAL FACTORS: {Personal  factors:25162} are also affecting patient's functional outcome.   REHAB POTENTIAL: {rehabpotential:25112}  CLINICAL DECISION MAKING: {clinical decision making:25114}  EVALUATION COMPLEXITY: {Evaluation complexity:25115}   GOALS:  Goals reviewed with patient? {yes/no:20286}  SHORT TERM GOALS: Target date: 03/01/2023  Patient will be independent with initial home exercise program for self-management of symptoms. Baseline: {HEPbaseline4:27310} (02/15/23); Goal status: INITIAL   LONG TERM GOALS: Target date: 05/10/2023  Patient will be independent with a long-term home exercise program for self-management of symptoms.  Baseline: {HEPbaseline4:27310} (02/15/23); Goal status: INITIAL  2.  Patient will demonstrate improved FOTO to equal or greater than *** by visit #*** to demonstrate improvement in overall condition and self-reported functional ability.  Baseline: *** (02/15/23); Goal status: INITIAL  3.  *** Baseline: *** (02/15/23); Goal status: INITIAL  4.  *** Baseline: *** (02/15/23); Goal status: INITIAL  5.  Patient will complete community, work and/or recreational activities without limitation due to current condition.  Baseline: *** (02/15/23); Goal status: INITIAL  6.  *** Baseline: *** Goal status: INITIAL    PLAN:  PT FREQUENCY: {rehab frequency:25116}  PT DURATION: {rehab duration:25117}  PLANNED INTERVENTIONS: {rehab planned interventions:25118::"Therapeutic exercises","Therapeutic activity","Neuromuscular re-education","Balance training","Gait training","Patient/Family education","Self Care","Joint mobilization"}  PLAN FOR NEXT SESSION: ***   Cira Rue, PT, DPT 02/15/2023, 10:10 AM   [facility] [address] [place], [place] [address] P: [phone] I F: [phone]

## 2023-02-17 ENCOUNTER — Ambulatory Visit: Payer: Commercial Managed Care - PPO | Admitting: Physical Therapy

## 2023-02-18 ENCOUNTER — Other Ambulatory Visit: Payer: Self-pay

## 2023-02-18 ENCOUNTER — Ambulatory Visit: Payer: Commercial Managed Care - PPO

## 2023-02-22 ENCOUNTER — Ambulatory Visit: Payer: Commercial Managed Care - PPO | Attending: Orthopedic Surgery

## 2023-02-22 ENCOUNTER — Ambulatory Visit: Payer: Commercial Managed Care - PPO | Admitting: Physical Therapy

## 2023-02-22 DIAGNOSIS — M5459 Other low back pain: Secondary | ICD-10-CM | POA: Diagnosis not present

## 2023-02-22 DIAGNOSIS — R262 Difficulty in walking, not elsewhere classified: Secondary | ICD-10-CM | POA: Diagnosis not present

## 2023-02-22 DIAGNOSIS — G8929 Other chronic pain: Secondary | ICD-10-CM

## 2023-02-22 DIAGNOSIS — M25561 Pain in right knee: Secondary | ICD-10-CM | POA: Diagnosis not present

## 2023-02-23 NOTE — Therapy (Signed)
[facility] [address] [place], [address] Phone: [phone]   Fax:  [phone]  Outpatient Physical Therapy Evaluation  Patient Details  Name: Jasmine Willis MRN: [id number] Date of Birth: 08-Mar-1966 Referring Provider (PT): Juanell Fairly, MD (orthopedics)   Encounter Date: 02/22/2023   PT End of Session - 02/23/23 0851     Visit Number 1    Number of Visits 16    Date for PT Re-Evaluation 04/19/23    Authorization Type [employer]    Authorization Time Period 02/22/23-04/19/23    Progress Note Due on Visit 10    PT Start Time 1732    PT Stop Time 1815    PT Time Calculation (min) 43 min    Activity Tolerance Patient tolerated treatment well;No increased pain    Behavior During Therapy WFL for tasks assessed/performed             Past Medical History:  Diagnosis Date   Anxiety    Primary osteoarthritis of right knee 06/23/2018   R knee xray 05/2016 - Mild joint space narrowing in the medial compartment, mild subchondral sclerosis in the medial tibial plateau    Past Surgical History:  Procedure Laterality Date   NO PAST SURGERIES      There were no vitals filed for this visit.    Subjective Assessment -     Subjective Pt is sick of walking funny, much antalgia at work. 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 She's maxed out on steroid injections and wants to avoid surgery, but doesn't know what to do.    Pertinent History Jasmine Willis is a 57yoF who is referred to OPPT after ongoing difficulty walking, regular limping- pt correlated chronic low back pain and Rt medial knee pain. Most recent lumbar flare earlier this year, was offered 'surgery or injection', she opted for conservative approach and has gotten a lot of help from chiropractic care as well. Pt has been told she has "bone on bone" knee OA, s/p multiple steroid injections,  but recommended to try physical therapy at this point. 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    Patient Stated Goals fix her limping, fix her pain in her back and knee    Currently in Pain? No/denies                [facility] PT Assessment -      Assessment   Referring Provider (PT) Juanell Fairly, MD   orthopedics   Onset Date/Surgical Date --   last here for low back pain in 2020, but ongoing prior to that   Prior Therapy was seen for PT here for back in 2020; seen by chiropractic for low back pain      Precautions   Precautions None      Balance Screen   Has the patient fallen in the past 6 months No    Has the patient had a decrease in activity level because of a fear of falling?  No    Is the patient reluctant to leave their home because of a fear of falling?  No      Prior Function   Vocation Full time employment      ROM / Strength   AROM / PROM / Strength PROM  PROM   PROM Assessment Site Hip    Right/Left Hip Right;Left    Right Hip Extension --   ~15-20 degrees prone   Right Hip Flexion --   >125 degrees   Right Hip External Rotation  95     Right Hip Internal Rotation  5     Right Hip ABduction --   >35 degrees   Left Hip Extension ~15-20 degrees prone    Left Hip Flexion --   >125 degrees   Left Hip External Rotation  40    Left Hip Internal Rotation  45    Left Hip ABduction --   >35 degrees     Strength   Right/Left Hip Right;Left    Right Hip Flexion 5/5   4-/29 March 2019   Right Hip Extension 4/5   difficulty rating due to insufficient core stabilizaion, ROM WNL   Right Hip External Rotation  5/5   seated, no knee pain aggravation   Right Hip Internal Rotation 5/5   seated, no knee pain aggravation   Right Hip ABduction 4/5    testing supine, mildly weaker, notable varus laxity in testing, no knee pain exacerbation   Right Hip  ADduction 5/5   horizontal adduction   Left Hip Flexion 5/5   4-/29 March 2019   Left Hip Extension 4/5   difficulty rating due to insufficient core stabilizaion, ROM WNL   Left Hip External Rotation 5/5   seated, no knee pain aggravation   Left Hip Internal Rotation 5/5   seated, no knee pain aggravation   Left Hip ABduction 4/5   testing supine   Left Hip ADduction 5/5   horizontal adduction   Right Knee Flexion 5/5    Left Knee Flexion 5/5      Palpation   Palpation comment [place] spring testing, lumbar segments: L1-L4 normal to slight hypermobility (for age), non tender. L5 symptomatic pain, unable to apply enough pressure to assess joint mobility.    sacral thrust negative for pain           Overground AMB assessment:  *right abducted gait, left adducted (central to COM), right sided vaulting  Leg length assessment in narrow stance: greater trochanter to floor (pt shod)  *97 cm Left GT to floor, 98 cm GT to floor   Dead bug endurance testing: -isometric hold x45 seconds (fatigue/failure)   Objective measurements completed on examination: See above findings.     PT Education -     Education Details Based on patient report, she should likely be followed by neurosurgical regularly (their recommendation for surgery)    Person(s) Educated Patient    Methods Explanation    Comprehension Verbalized understanding;Returned demonstration;Need further instruction              PT Short Term Goals -        PT SHORT TERM GOAL #1   Title Pt to report consistent performance to HEP without symptoms exacerbation and subjective improvements in strength and motor control.    Time 4    Period Weeks    Status New    Target Date 03/23/23      PT SHORT TERM GOAL #2   Title Pt to demonstrate tolerance of performance with fewer than 3 standing recovery breaks 2/2 low bakc pain.    Time 4    Period Weeks    Status New    Target Date 03/23/23  PT  Long Term Goals -       PT LONG TERM GOAL #1   Title FOTO survery score improvement by >12 points to indicate improved ability to perform ADL/IADL required mobility tasks.    Time 8    Period Weeks    Status New    Target Date 04/20/23      PT LONG TERM GOAL #2   Title Pt to tolerate 30sec chair rise >13x without Rt knee pain to indicate improved functional use of Rt knee in transfers.    Time 8    Period Weeks    Status New    Target Date 04/20/23      PT LONG TERM GOAL #3   Title Pt to demonstrate 531ft AMB without valuting gait and without significant abducted limb to improve ability to perform pain-free limited community distance AMB.    Time 8    Period Weeks    Status New    Target Date 04/20/23                    Plan -    Clinical Impression Statement Pt referred for Rt knee OA by orthopedics, has not tried PT for knee yet. Examination demonstrates signs of Rt knee DJD, albeit testing is largely pain free in Rt knee. Some knee testing is limited by apprehension. Segmental testing of lumbar spine indicates focal pain at the L5/S1 segment, I suspect a component of common segmental hyperlordosis related to abdominal weakness/impaired lumbopelvic control. Presentation does not indicate and current radicular pattern. AMB deficits show apparent vauling and asymetry of foot placement under COM, much greater than weightbearing leg length assessment correlates and greater than apparent differences in femur length when seated and when hooklying. Future assessment should correlate pelvis alignment to r/o any innominate asymetry at the femoroacetabular joint, especially given subacute nature of gait changes. varus loading response in gait difficult to see due to scrub pants, but generalized strengthening of knee joint and hip would improve gait deficits and pt could consider a more supportive brace for workplace if varus forces prove to be exceedingly provocative. Core strengthening  may prove beneficial to improving toelrance to overground AMB distances which are limited to less than limited community distances at this time.    Personal Factors and Comorbidities Time since onset of injury/illness/exacerbation;Fitness    Examination-Activity Limitations Bend;Lift;Carry;Locomotion Level;Stand;Squat    Examination-Participation Restrictions Cleaning;Occupation    Stability/Clinical Decision Making Evolving/Moderate complexity    Clinical Decision Making High    Rehab Potential Good    PT Frequency 2x / week    PT Duration 8 weeks    PT Treatment/Interventions Therapeutic exercise;Neuromuscular re-education;Therapeutic activities;Patient/family education;Manual techniques;Dry needling;Spinal Manipulations;Joint Manipulations;Electrical Stimulation;Iontophoresis 4mg /ml Dexamethasone;Traction;Moist Heat;Gait training;Stair training;ADLs/Self Care Home Management;Cryotherapy;DME Instruction;Passive range of motion    PT Next Visit Plan Get feedback on HEP performance; innominate assessment and LLD assessment, ?muslce energy techniques to correct, trunk and glute strengthening, lumbopelvic control, manual techniques, modalities PRN    PT Home Exercise Plan Access Code: [email or web address]  URL: [email or web address]  Date: 02/22/2023  Prepared by: Alvera Novel    Exercises  - Supine Bridge  - 1 x daily - 7 x weekly - 3 sets - 10 reps  - Supine March  - 1 x daily - 7 x weekly - 3 sets - 10 reps  - Seated Long Arc Quad  - 1 x daily - 7 x weekly - 3 sets - 10 reps    Consulted and  Agree with Plan of Care Patient             Patient will benefit from skilled therapeutic intervention in order to improve the following deficits and impairments:  Pain, Postural dysfunction, Improper body mechanics, Decreased strength, Abnormal gait, Decreased activity tolerance, Decreased mobility, Decreased knowledge of precautions, Difficulty walking  Visit Diagnosis: Chronic pain of right  knee  Other low back pain  Difficulty in walking, not elsewhere classified     Problem List Patient Active Problem List   Diagnosis Date Noted   Fall with injury 11/11/2022   Rib injury 11/11/2022   Knee strain, right, initial encounter 11/11/2022   Numbness and tingling in right hand 06/17/2022   Left foot pain 02/25/2022   Sea sickness 05/07/2021   Chronic pain of right knee 11/11/2020   Pain, dental 11/11/2020   Low vitamin B12 level 03/10/2019   Vitamin D deficiency 03/10/2019   Fatigue 06/23/2018   Primary osteoarthritis of right knee 06/23/2018   IDA (iron deficiency anemia) 06/23/2018   Health maintenance examination 08/09/2015   Obesity (BMI 30.0-34.9) 08/09/2015   Depression with anxiety    1:13 PM, 02/23/23 Rosamaria Lints, PT, DPT Physical Therapist - [place] [phone] (Office)   Liberty C, PT 02/23/2023, 10:28 AM  [facility] [address] [place], [address] Phone: [phone]   Fax:  [phone]  Name: Jasmine Willis MRN: [id number] Date of Birth: April 09, 1966

## 2023-02-24 ENCOUNTER — Encounter: Payer: Commercial Managed Care - PPO | Admitting: Physical Therapy

## 2023-02-24 ENCOUNTER — Other Ambulatory Visit: Payer: Self-pay

## 2023-02-24 ENCOUNTER — Ambulatory Visit: Payer: Commercial Managed Care - PPO

## 2023-02-24 DIAGNOSIS — G8929 Other chronic pain: Secondary | ICD-10-CM

## 2023-02-24 DIAGNOSIS — M5459 Other low back pain: Secondary | ICD-10-CM

## 2023-02-24 DIAGNOSIS — R262 Difficulty in walking, not elsewhere classified: Secondary | ICD-10-CM

## 2023-02-24 DIAGNOSIS — M25561 Pain in right knee: Secondary | ICD-10-CM | POA: Diagnosis not present

## 2023-02-24 NOTE — Therapy (Signed)
[facility] [address] [place], [address] Phone: [phone]   Fax:  [phone]  Outpatient Physical Therapy Treatment  Patient Details  Name: Jasmine Willis MRN: [id number] Date of Birth: [birth year] Referring Provider (PT): Juanell Fairly, MD (orthopedics)   Encounter Date: 02/24/2023   PT End of Session - 02/24/23 1757     Visit Number 2    Number of Visits 16    Date for PT Re-Evaluation 04/19/23    Authorization Type [employer]    Authorization Time Period 02/22/23-04/19/23    Progress Note Due on Visit 10    PT Start Time 1730    PT Stop Time 1810    PT Time Calculation (min) 40 min    Activity Tolerance Patient tolerated treatment well;No increased pain    Behavior During Therapy WFL for tasks assessed/performed             Past Medical History:  Diagnosis Date   Anxiety    Primary osteoarthritis of right knee 06/23/2018   R knee xray 05/2016 - Mild joint space narrowing in the medial compartment, mild subchondral sclerosis in the medial tibial plateau    Past Surgical History:  Procedure Laterality Date   NO PAST SURGERIES      There were no vitals filed for this visit.    Subjective Assessment -     Subjective Pt worked today, tried to keep her knee lose while seated a lot, took standing /walking opportunities. Was able to perform HEP with success, no pain or issues.    Pertinent History Jasmine Willis is a 57yoF who is referred to OPPT after ongoing difficulty walking, regular limping- pt correlated chronic low back pain and Rt medial knee pain. Most recent lumbar flare earlier this year, was offered 'surgery or injection', she opted for conservative approach and has gotten a lot of help from chiropractic care as well. Pt has been told she has "bone on bone" knee OA, s/p multiple steroid injections, but recommended to try physical therapy at this point. 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    Patient Stated Goals fix her limping, fix her pain in her back and knee    Currently in Pain? No/denies            Assessment 7/3: Resting supine, RLE ~1 cm longer (towel under Lt knee to mirror TKE deficit of Rt knee)  Left ASIS to MM 89cm  Right ASIS to MM 87cm (Rt ASIS very tender compared to CL)  RT ASIS seems more elevated or more posterior  Negative pelvic compression test, negative thigh thrust test         Objective measurements completed on examination: See above findings.  TODAY'S TREATMENT: Nustep x4 minutes, seat 9 arms 9, level 2 x 4 minutes   Hooklying bridge 1x10 (no back pain)   Rt Single leg bridge, Left leg straight x10 Left SKTC x60sec, Rt leg straight  Rt Single leg bridge, Left leg straight x10 Left SKTC x60sec, Rt leg straight  Rt Single leg bridge, Left leg straight x10 Left SKTC x60sec, Rt leg straight   Rt TENS applied to address neurogenic inhibition: SAQ on burgundy bolster 1x10x3secH 5lb AW 1x10x3secH 7lb AW (no pain) 1x10x3secH 10lb AW (no pain)   ------------ Post treatment assessment:  -ASIS in supine appears unchanged bilat (more  cranial on Rt) -Leg length appears equal now -AMB overgrond 4x10M appears 50% corrected in symmetry inititally, then over 2nd 20 meters closer to 75% improved symmetry, absence of vaulting *Pt agrees she feels less valuting, less hobbling *knee feels good (despite higher impact forces noted)       PT Education -     Education Details Based on patient report, she should likely be followed by neurosurgical regularly (their recommendation for surgery)    Person(s) Educated Patient    Methods Explanation    Comprehension Verbalized understanding;Returned demonstration;Need further instruction              PT Short Term Goals -        PT SHORT TERM GOAL #1   Title Pt to  report consistent performance to HEP without symptoms exacerbation and subjective improvements in strength and motor control.    Time 4    Period Weeks    Status New    Target Date 03/23/23      PT SHORT TERM GOAL #2   Title Pt to demonstrate tolerance of performance with fewer than 3 standing recovery breaks 2/2 low bakc pain.    Time 4    Period Weeks    Status New    Target Date 03/23/23               PT Long Term Goals -       PT LONG TERM GOAL #1   Title FOTO survery score improvement by >12 points to indicate improved ability to perform ADL/IADL required mobility tasks.    Time 8    Period Weeks    Status New    Target Date 04/20/23      PT LONG TERM GOAL #2   Title Pt to tolerate 30sec chair rise >13x without Rt knee pain to indicate improved functional use of Rt knee in transfers.    Time 8    Period Weeks    Status New    Target Date 04/20/23      PT LONG TERM GOAL #3   Title Pt to demonstrate 572ft AMB without valuting gait and without significant abducted limb to improve ability to perform pain-free limited community distance AMB.    Time 8    Period Weeks    Status New    Target Date 04/20/23                    Plan -    Clinical Impression Statement Commenced treatment plan. No pain exacerbation. Leg lengths are not equal, and pelvis appears to be rotated. Post intervention improvements in AMB and subjective report of asymmetry in gait. Varus loading response in gait difficult to see due to scrub pants, but generalized strengthening of knee joint and hip would improve gait deficits and pt could consider a more supportive brace for workplace if varus forces prove to be exceedingly provocative. Core strengthening may prove beneficial to improving toelrance to overground AMB distances which are limited to less than limited community distances at this time.    Personal Factors and Comorbidities Time since onset of  injury/illness/exacerbation;Fitness    Examination-Activity Limitations Bend;Lift;Carry;Locomotion Level;Stand;Squat    Examination-Participation Restrictions Cleaning;Occupation    Stability/Clinical Decision Making Evolving/Moderate complexity    Clinical Decision Making High    Rehab Potential Good    PT Frequency 2x / week    PT Duration 8 weeks    PT Treatment/Interventions Therapeutic exercise;Neuromuscular re-education;Therapeutic activities;Patient/family education;Manual techniques;Dry  needling;Spinal Manipulations;Joint Manipulations;Electrical Stimulation;Iontophoresis 4mg /ml Dexamethasone;Traction;Moist Heat;Gait training;Stair training;ADLs/Self Care Home Management;Cryotherapy;DME Instruction;Passive range of motion    PT Next Visit Plan Get feedback on HEP performance; innominate assessment and LLD assessment, ?muslce energy techniques to correct, trunk and glute strengthening, lumbopelvic control, manual techniques, modalities PRN    PT Home Exercise Plan Access Code: [email or web address]  URL: [email or web address]  Date: 02/22/2023  Prepared by: Alvera Novel    Exercises  - Supine Bridge  - 1 x daily - 7 x weekly - 3 sets - 10 reps  - Supine March  - 1 x daily - 7 x weekly - 3 sets - 10 reps  - Seated Long Arc Quad  - 1 x daily - 7 x weekly - 3 sets - 10 reps    Consulted and Agree with Plan of Care Patient             Patient will benefit from skilled therapeutic intervention in order to improve the following deficits and impairments:     Visit Diagnosis: Chronic pain of right knee  Other low back pain  Difficulty in walking, not elsewhere classified     Problem List Patient Active Problem List   Diagnosis Date Noted   Fall with injury 11/11/2022   Rib injury 11/11/2022   Knee strain, right, initial encounter 11/11/2022   Numbness and tingling in right hand 06/17/2022   Left foot pain 02/25/2022   Sea sickness 05/07/2021   Chronic pain of right knee  11/11/2020   Pain, dental 11/11/2020   Low vitamin B12 level 03/10/2019   Vitamin D deficiency 03/10/2019   Fatigue 06/23/2018   Primary osteoarthritis of right knee 06/23/2018   IDA (iron deficiency anemia) 06/23/2018   Health maintenance examination 08/09/2015   Obesity (BMI 30.0-34.9) 08/09/2015   Depression with anxiety    6:08 PM, 02/24/23 Rosamaria Lints, PT, DPT Physical Therapist - [place] [phone] (Office)   Rosamaria Lints, PT 02/24/2023, 6:08 PM  [facility] [address] [place], [address] Phone: [phone]   Fax:  [phone]  Name: Jasmine Willis MRN: [id number] Date of Birth: [birth year]

## 2023-03-01 ENCOUNTER — Ambulatory Visit: Payer: Commercial Managed Care - PPO | Admitting: Physical Therapy

## 2023-03-01 ENCOUNTER — Encounter: Payer: Commercial Managed Care - PPO | Admitting: Physical Therapy

## 2023-03-01 ENCOUNTER — Encounter: Payer: Self-pay | Admitting: Physical Therapy

## 2023-03-01 DIAGNOSIS — G8929 Other chronic pain: Secondary | ICD-10-CM

## 2023-03-01 DIAGNOSIS — M5459 Other low back pain: Secondary | ICD-10-CM

## 2023-03-01 DIAGNOSIS — R262 Difficulty in walking, not elsewhere classified: Secondary | ICD-10-CM | POA: Diagnosis not present

## 2023-03-01 DIAGNOSIS — M25561 Pain in right knee: Secondary | ICD-10-CM | POA: Diagnosis not present

## 2023-03-01 NOTE — Therapy (Signed)
OUTPATIENT PHYSICAL THERAPY TREATMENT NOTE   Patient Name: Jasmine Willis MRN: [id number] DOB:25-Jul-1966, 57 y.o., female Today's Date: 03/01/2023  PCP: Eustaquio Boyden, MD REFERRING PROVIDER: Juanell Fairly, MD   END OF SESSION:  PT End of Session - 03/01/23 1738     Visit Number 3    Number of Visits 16    Date for PT Re-Evaluation 04/19/23    Authorization Type [employer] reporting period from 02/22/2023    Authorization Time Period ONLY 4 MODALITIES PER DAY  VL: 25 combined PT/OT per year    Authorization - Visit Number 3    Authorization - Number of Visits 25    Progress Note Due on Visit 10    PT Start Time 1735    PT Stop Time 1815    PT Time Calculation (min) 40 min    Activity Tolerance Patient tolerated treatment well;No increased pain    Behavior During Therapy WFL for tasks assessed/performed             Past Medical History:  Diagnosis Date   Anxiety    Primary osteoarthritis of right knee 06/23/2018   R knee xray 05/2016 - Mild joint space narrowing in the medial compartment, mild subchondral sclerosis in the medial tibial plateau   Past Surgical History:  Procedure Laterality Date   NO PAST SURGERIES     Patient Active Problem List   Diagnosis Date Noted   Fall with injury 11/11/2022   Rib injury 11/11/2022   Knee strain, right, initial encounter 11/11/2022   Numbness and tingling in right hand 06/17/2022   Left foot pain 02/25/2022   Sea sickness 05/07/2021   Chronic pain of right knee 11/11/2020   Pain, dental 11/11/2020   Low vitamin B12 level 03/10/2019   Vitamin D deficiency 03/10/2019   Fatigue 06/23/2018   Primary osteoarthritis of right knee 06/23/2018   IDA (iron deficiency anemia) 06/23/2018   Health maintenance examination 08/09/2015   Obesity (BMI 30.0-34.9) 08/09/2015   Depression with anxiety     REFERRING DIAG: other specified arthritis of the right knee  THERAPY DIAG:  Chronic pain of right knee  Other low  back pain  Difficulty in walking, not elsewhere classified  Rationale for Evaluation and Treatment: Rehabilitation  PERTINENT HISTORY: Alisen Fredrickson is a 57yoF who is referred to OPPT after ongoing difficulty walking, regular limping- pt correlated chronic low back pain and Rt medial knee pain. Most recent lumbar flare earlier this year, was offered 'surgery or injection', she opted for conservative approach and has gotten a lot of help from chiropractic care as well. Pt has been told she has "bone on bone" knee OA, s/p multiple steroid injections, but recommended to try physical therapy at this point. 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   PRECAUTIONS: none  SUBJECTIVE:   SUBJECTIVE STATEMENT: Patient reports she is doing okay today. She states she is about the same as she was at last PT session. She states she does feel like her pelvis is a little straighter.   PAIN:  NPRS: 2/10 anterior right knee pain, and a little bit of back pain at mid lower back.   Patient goals: fix her limping, fix her pain in her back and knee. Be able to walk around the hospital.   OBJECTIVE:   POSTURE - L knee with significant genu-recurvatum, R knee with minimal flexion.  ACCESSORY MOTION - R patella moderately hypomobile in all directions  PALPATION  - concordant TTP at medial/inferior patella and over patellar tendon.    TODAY'S TREATMENT:   Therapeutic exercise: to centralize symptoms and improve ROM, strength, muscular endurance, and activity tolerance required for successful completion of functional activities.  - NuStep level 3 using bilateral upper and lower extremities. Seat/handle setting 9/9. For improved extremity mobility, muscular endurance, and activity tolerance; and to induce the analgesic effect of aerobic exercise, stimulate improved joint nutrition, and prepare body  structures and systems for following interventions. x 7  minutes. Average SPM = 85. - observation of ambulation, antalgic gait favoring R LE, no vaulting today.  - observation of standing posture: L knee with significant genu-recurvatum, R knee with minimal flexion.  - seated long arc quad in seated position: 3x10 each side with 10#AW. Feels it in her back with R knee extension.  - seated short arc quad in seated position: 3x10 each side with 10#AW.  - observation of ambulation following manual therapy: increased antalgic gait "I feel more crooked" but "knee pain is gone"  Manual therapy: to reduce pain and tissue tension, improve range of motion, neuromodulation, in order to promote improved ability to complete functional activities. HOOKLYING - R patellar mobilizations grade III-IV medal, lateral, superior.  - R tibiofemoral extension grade III-IV to tolerance.  - R knee palpation (see above).    PATIENT EDUCATION: Education details: Exercise purpose/form. Self management techniques. Education on diagnosis, prognosis, POC, anatomy and physiology of current condition Education on HEP including handout. Reviewed cancelation/no-show policy with patient,  patient verbalized understanding (03/01/23). Person educated: Patient Education method: Medical illustrator Education comprehension: verbalized understanding, returned demonstration, and needs further education  HOME EXERCISE PROGRAM: Access Code: [email or web address] URL: [email or web address] Date: 03/01/2023 Prepared by: Norton Blizzard  Exercises - Long Sitting Quad Set with Towel Roll Under Heel  - 1 x daily - 2 sets - 10 reps - 10 seconds hold - Supine Bridge  - 1 x daily - 7 x weekly - 3 sets - 10 reps - Supine March  - 1 x daily - 7 x weekly - 3 sets - 10 reps - Seated Long Arc Quad  - 1 x daily - 7 x weekly - 3 sets - 10 reps   PT Short Term Goals: target date for all goals 03/23/2023.       PT SHORT TERM GOAL #1    Title Pt to report consistent performance to HEP without symptoms exacerbation and subjective improvements in strength and motor control.    Time 4    Period Weeks    Status In- progress   Target Date 03/23/23      PT SHORT TERM GOAL #2   Title Pt to demonstrate tolerance of performance with fewer than 3 standing recovery breaks 2/2 low bakc pain.    Time 4    Period Weeks   Status In-progress   Target Date 03/23/23              PT Long Term Goals: target date for all long term goals is 04/20/2023      PT LONG TERM GOAL #1   Title FOTO survery score improvement by >12 points to indicate improved ability to perform ADL/IADL required mobility tasks.    Time 8    Period Weeks    Status In-progress   Target Date 04/20/23      PT LONG TERM GOAL #2   Title Pt  to tolerate 30sec chair rise >13x without Rt knee pain to indicate improved functional use of Rt knee in transfers.    Time 8    Period Weeks    Status In-progress   Target Date 04/20/23      PT LONG TERM GOAL #3   Title Pt to demonstrate 552ft AMB without valuting gait and without significant abducted limb to improve ability to perform pain-free limited community distance AMB.    Time 8    Period Weeks    Status In -progress   Target Date 04/20/23              Plan     Clinical Impression Statement Patient arrives reporting she feels straighter but otherwise the same as last PT session. Today's session focused on addressing ROM and pain restrictions at R knee joint. Patient was painful to palpation at medial and inferior R patella and over patellar tendon. She had no knee pain with quad loading with 10# LAQ and SAQ today (recommend increasing load next session) but did report she felt it in her back when doing these motions (recommend SLR neurodynamic text next session). She was negative for R slump. Patient reported resolution of knee pain after manual therapy but has significant limitation in terminal knee  extension. HEP was updated to include quad set with focus on TKE, which she tolerated well. Plan to further assess neural tension next session and progress hip strengthening exercises as appropriate. Patient would benefit from continued management of limiting condition by skilled physical therapist to address remaining impairments and functional limitations to work towards stated goals and return to PLOF or maximal functional independence.    Personal Factors and Comorbidities Time since onset of injury/illness/exacerbation;Fitness    Examination-Activity Limitations Bend;Lift;Carry;Locomotion Level;Stand;Squat    Examination-Participation Restrictions Cleaning;Occupation    Stability/Clinical Decision Making Evolving/Moderate complexity    Rehab Potential Good    PT Frequency 2x / week    PT Duration 8 weeks    PT Treatment/Interventions Therapeutic exercise;Neuromuscular re-education;Therapeutic activities;Patient/family education;Manual techniques;Dry needling;Spinal Manipulations;Joint Manipulations;Electrical Stimulation;Iontophoresis 4mg /ml Dexamethasone;Traction;Moist Heat;Gait training;Stair training;ADLs/Self Care Home Management;Cryotherapy;DME Instruction;Passive range of motion    PT Next Visit Plan Progressive LE/core/functional strengthening, motor control, and ROM exercises as tolerated, innominate assessment and MET as needed, manual therapy/joint mobilizations and dry needling as needed. Education.    Consulted and Agree with Plan of Care Patient             Cira Rue, PT, DPT 03/01/2023, 6:35 PM  [facility] [address] [place], [place] [address] P: [phone] I F: [phone]

## 2023-03-03 ENCOUNTER — Ambulatory Visit: Payer: Commercial Managed Care - PPO | Admitting: Physical Therapy

## 2023-03-03 ENCOUNTER — Encounter: Payer: Commercial Managed Care - PPO | Admitting: Physical Therapy

## 2023-03-03 ENCOUNTER — Encounter: Payer: Self-pay | Admitting: Physical Therapy

## 2023-03-03 DIAGNOSIS — G8929 Other chronic pain: Secondary | ICD-10-CM

## 2023-03-03 DIAGNOSIS — M25561 Pain in right knee: Secondary | ICD-10-CM | POA: Diagnosis not present

## 2023-03-03 DIAGNOSIS — M5459 Other low back pain: Secondary | ICD-10-CM

## 2023-03-03 DIAGNOSIS — R262 Difficulty in walking, not elsewhere classified: Secondary | ICD-10-CM

## 2023-03-03 NOTE — Therapy (Signed)
OUTPATIENT PHYSICAL THERAPY TREATMENT NOTE   Patient Name: Jasmine [facility] MRN: [id number] DOB:1966-04-09, 57 y.o., female Today's Date: 03/03/2023  PCP: Jasmine Boyden, MD REFERRING PROVIDER: Juanell Fairly, MD   END OF SESSION:  PT End of Session - 03/03/23 1803     Visit Number 4    Number of Visits 16    Date for PT Re-Evaluation 04/19/23    Authorization Type [employer] P[employer] reporting period from 02/22/2023    Authorization Time Period ONLY 4 MODALITIES PER DAY  VL: 25 combined PT/OT per year    Authorization - Visit Number 4    Authorization - Number of Visits 25    Progress Note Due on Visit 10    PT Start Time 1803    PT Stop Time 1843    PT Time Calculation (min) 40 min    Activity Tolerance Patient tolerated treatment well;No increased pain    Behavior During Therapy WFL for tasks assessed/performed              Past Medical History:  Diagnosis Date   Anxiety    Primary osteoarthritis of right knee 06/23/2018   R knee xray 05/2016 - Mild joint space narrowing in the medial compartment, mild subchondral sclerosis in the medial tibial plateau   Past Surgical History:  Procedure Laterality Date   NO PAST SURGERIES     Patient Active Problem List   Diagnosis Date Noted   Fall with injury 11/11/2022   Rib injury 11/11/2022   Knee strain, right, initial encounter 11/11/2022   Numbness and tingling in right hand 06/17/2022   Left foot pain 02/25/2022   Sea sickness 05/07/2021   Chronic pain of right knee 11/11/2020   Pain, dental 11/11/2020   Low vitamin B12 level 03/10/2019   Vitamin D deficiency 03/10/2019   Fatigue 06/23/2018   Primary osteoarthritis of right knee 06/23/2018   IDA (iron deficiency anemia) 06/23/2018   Health maintenance examination 08/09/2015   Obesity (BMI 30.0-34.9) 08/09/2015   Depression with anxiety     REFERRING DIAG: other specified arthritis of the right knee  THERAPY DIAG:  Chronic pain of right knee  Other  low back pain  Difficulty in walking, not elsewhere classified  Rationale for Evaluation and Treatment: Rehabilitation  PERTINENT HISTORY: Jasmine [facility] is a 57yoF who is referred to OPPT after ongoing difficulty walking, regular limping- pt correlated chronic low back pain and Rt medial knee pain. Most recent lumbar flare earlier this year, was offered 'surgery or injection', she opted for conservative approach and has gotten a lot of help from chiropractic care as well. Pt has been told she has "bone on bone" knee OA, s/p multiple steroid injections, but recommended to try physical therapy at this point. 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   PRECAUTIONS: none  SUBJECTIVE:   SUBJECTIVE STATEMENT: Patient reports she had the day off and is feeling well. She had a lot of pain in her back at work yesterday after forgetting to take her medication. She states she has been doing her HEP for knee extension and she feels that is helping.   PAIN:  NPRS: 2/10 anterior  and posterior right knee pain.    Patient goals: fix her limping, fix her pain in her back and knee. Be able to walk around the hospital.   OBJECTIVE:   TODAY'S TREATMENT:   Therapeutic  exercise: to centralize symptoms and improve ROM, strength, muscular endurance, and activity tolerance required for successful completion of functional activities.  - observation of ambulation, antalgic gait favoring R LE, no vaulting today.  - NuStep level 5 using bilateral upper and lower extremities. Seat/handle setting 9/9. For improved extremity mobility, muscular endurance, and activity tolerance; and to induce the analgesic effect of aerobic exercise, stimulate improved joint nutrition, and prepare body structures and systems for following interventions. x 6  minutes. Average SPM = 86. - supine R quad set with heel elevated on  towel roll and strong active dorsiflexion, 1x10 with 5 second holds.  - hooklying bridge with toes elevated, 1x10 with 5 second hold.  - hooklying bridge with hip abduction against BlackTB loop at distal knees, 1x10 with 5 second hold.  - observation of ambulation, increased R knee flexion in stance phase, more antalgic. Cuing to try to improve knee extension in stance phase and heel strike with mild success, but unable to do without increased pain and intense concentration.  - seated knee extension at [facility] machine, seat position 2: 1x15 at 15#; 3x10 each side SLS at 10#. - observation of ambulation, improved gait pattern and reports improved pain.   Manual therapy: to reduce pain and tissue tension, improve range of motion, neuromodulation, in order to promote improved ability to complete functional activities. HOOKLYING - R patellar mobilizations grade III-IV medal, lateral, superior.  - R tibiofemoral extension grade III-IV to tolerance.    PATIENT EDUCATION: Education details: Exercise purpose/form. Self management techniques. Education on diagnosis, prognosis, POC, anatomy and physiology of current condition Education on HEP including handout. Reviewed cancelation/no-show policy with patient,  patient verbalized understanding (03/01/23). Person educated: Patient Education method: Medical illustrator Education comprehension: verbalized understanding, returned demonstration, and needs further education  HOME EXERCISE PROGRAM: Access Code: [email or web address] URL: [email or web address] Date: 03/03/2023 Prepared by: Jasmine [facility]  Exercises - Long Sitting Quad Set with Towel Roll Under Heel  - 1 x daily - 2 sets - 10 reps - 10 seconds hold - Bridge with Hip Abduction and Resistance - Ground Touches  - 1 x daily - 1 sets - 20 reps - 5 seconds hold - Supine March  - 1 x daily - 7 x weekly - 3 sets - 10 reps - Seated Long Arc Quad  - 1 x daily - 7 x weekly - 3 sets - 10  reps   PT Short Term Goals: target date for all goals 03/23/2023.       PT SHORT TERM GOAL #1   Title Pt to report consistent performance to HEP without symptoms exacerbation and subjective improvements in strength and motor control.    Time 4    Period Weeks    Status In- progress   Target Date 03/23/23      PT SHORT TERM GOAL #2   Title Pt to demonstrate tolerance of performance with fewer than 3 standing recovery breaks 2/2 low bakc pain.    Time 4    Period Weeks   Status In-progress   Target Date 03/23/23              PT Long Term Goals: target date for all long term goals is 04/20/2023      PT LONG TERM GOAL #1   Title FOTO survery score improvement by >12 points to indicate improved ability to perform ADL/IADL required mobility tasks.    Time 8    Period Weeks    Status In-progress  Target Date 04/20/23      PT LONG TERM GOAL #2   Title Pt to tolerate 30sec chair rise >13x without Rt knee pain to indicate improved functional use of Rt knee in transfers.    Time 8    Period Weeks    Status In-progress   Target Date 04/20/23      PT LONG TERM GOAL #3   Title Pt to demonstrate 575ft AMB without valuting gait and without significant abducted limb to improve ability to perform pain-free limited community distance AMB.    Time 8    Period Weeks    Status In -progress   Target Date 04/20/23              Plan     Clinical Impression Statement Patient arrives reporting she feels PT is helping and she has been doing her HEP. Today's session continued to focus on improving R knee extension PROM and AROM. Patient also was able to progress in quad and hip strengthening. She appears to tolerate open chain motions for knee extension very well but has more pain in weight bearing. Plan to start some closed chain hip and knee strengthening next session. Patient would benefit from continued management of limiting condition by skilled physical therapist to address  remaining impairments and functional limitations to work towards stated goals and return to PLOF or maximal functional independence.     Personal Factors and Comorbidities Time since onset of injury/illness/exacerbation;Fitness    Examination-Activity Limitations Bend;Lift;Carry;Locomotion Level;Stand;Squat    Examination-Participation Restrictions Cleaning;Occupation    Stability/Clinical Decision Making Evolving/Moderate complexity    Rehab Potential Good    PT Frequency 2x / week    PT Duration 8 weeks    PT Treatment/Interventions Therapeutic exercise;Neuromuscular re-education;Therapeutic activities;Patient/family education;Manual techniques;Dry needling;Spinal Manipulations;Joint Manipulations;Electrical Stimulation;Iontophoresis 4mg /ml Dexamethasone;Traction;Moist Heat;Gait training;Stair training;ADLs/Self Care Home Management;Cryotherapy;DME Instruction;Passive range of motion    PT Next Visit Plan Progressive LE/core/functional strengthening, motor control, and ROM exercises as tolerated, innominate assessment and MET as needed, manual therapy/joint mobilizations and dry needling as needed. Education.    Consulted and Agree with Plan of Care Patient             Cira Rue, PT, DPT 03/03/2023, 7:51 PM  [facility] [address] [place], [place] [address] P: [phone] I F: [phone]

## 2023-03-04 ENCOUNTER — Other Ambulatory Visit: Payer: Self-pay | Admitting: Family Medicine

## 2023-03-04 ENCOUNTER — Other Ambulatory Visit: Payer: Self-pay

## 2023-03-04 ENCOUNTER — Other Ambulatory Visit: Payer: Self-pay | Admitting: Physician Assistant

## 2023-03-04 DIAGNOSIS — F418 Other specified anxiety disorders: Secondary | ICD-10-CM

## 2023-03-04 DIAGNOSIS — M1711 Unilateral primary osteoarthritis, right knee: Secondary | ICD-10-CM

## 2023-03-04 DIAGNOSIS — R110 Nausea: Secondary | ICD-10-CM

## 2023-03-04 DIAGNOSIS — E669 Obesity, unspecified: Secondary | ICD-10-CM

## 2023-03-05 ENCOUNTER — Other Ambulatory Visit: Payer: Self-pay

## 2023-03-05 NOTE — Telephone Encounter (Signed)
Name of Medication:  Phentermine, Tramadol Name of Pharmacy:  [facility] Last Fill or Written Date and Quantity:       Phentermine- 02/15/23, #30      Tramadol- 02/01/23, #20 Last Office Visit and Type:  11/11/22, fall Next Office Visit and Type:  none Last Controlled Substance Agreement Date:  none Last UDS:  none  Diclofenac tab last filled:  02/12/23, #30 Anusol supp last filled:  02/15/23, #12 supp Hydroxyzine last filled:  01/27/23, #30 Robaxin last filled:  02/12/23, #60 Zofran last filled:  01/27/23, #20

## 2023-03-07 ENCOUNTER — Other Ambulatory Visit: Payer: Self-pay

## 2023-03-08 ENCOUNTER — Encounter: Payer: Commercial Managed Care - PPO | Admitting: Physical Therapy

## 2023-03-08 ENCOUNTER — Other Ambulatory Visit: Payer: Self-pay

## 2023-03-08 MED FILL — Phentermine HCl Cap 30 MG: ORAL | 30 days supply | Qty: 30 | Fill #0 | Status: CN

## 2023-03-08 MED FILL — Hydroxyzine HCl Tab 25 MG: ORAL | 15 days supply | Qty: 30 | Fill #0 | Status: AC

## 2023-03-08 MED FILL — Ondansetron HCl Tab 4 MG: ORAL | 7 days supply | Qty: 20 | Fill #0 | Status: AC

## 2023-03-08 MED FILL — Hydrocortisone Acetate Suppos 25 MG: RECTAL | 6 days supply | Qty: 12 | Fill #0 | Status: AC

## 2023-03-08 MED FILL — Methocarbamol Tab 500 MG: ORAL | 30 days supply | Qty: 60 | Fill #0 | Status: AC

## 2023-03-08 MED FILL — Tramadol HCl Tab 50 MG: ORAL | 7 days supply | Qty: 20 | Fill #0 | Status: AC

## 2023-03-08 MED FILL — Diclofenac Sodium Tab Delayed Release 75 MG: ORAL | 30 days supply | Qty: 30 | Fill #0 | Status: AC

## 2023-03-08 NOTE — Telephone Encounter (Signed)
 ERx 

## 2023-03-09 ENCOUNTER — Other Ambulatory Visit: Payer: Self-pay

## 2023-03-10 ENCOUNTER — Encounter: Payer: Self-pay | Admitting: Physical Therapy

## 2023-03-10 ENCOUNTER — Ambulatory Visit: Payer: Commercial Managed Care - PPO | Admitting: Physical Therapy

## 2023-03-10 ENCOUNTER — Encounter: Payer: Commercial Managed Care - PPO | Admitting: Physical Therapy

## 2023-03-10 DIAGNOSIS — R262 Difficulty in walking, not elsewhere classified: Secondary | ICD-10-CM

## 2023-03-10 DIAGNOSIS — M5459 Other low back pain: Secondary | ICD-10-CM | POA: Diagnosis not present

## 2023-03-10 DIAGNOSIS — G8929 Other chronic pain: Secondary | ICD-10-CM

## 2023-03-10 DIAGNOSIS — M25561 Pain in right knee: Secondary | ICD-10-CM | POA: Diagnosis not present
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
## 2023-03-15 ENCOUNTER — Ambulatory Visit: Payer: Commercial Managed Care - PPO | Admitting: Physical Therapy

## 2023-03-15 ENCOUNTER — Encounter: Payer: Self-pay | Admitting: Physical Therapy

## 2023-03-15 ENCOUNTER — Encounter: Payer: Commercial Managed Care - PPO | Admitting: Physical Therapy

## 2023-03-15 DIAGNOSIS — R262 Difficulty in walking, not elsewhere classified: Secondary | ICD-10-CM

## 2023-03-15 DIAGNOSIS — M25561 Pain in right knee: Secondary | ICD-10-CM | POA: Diagnosis not present

## 2023-03-15 DIAGNOSIS — G8929 Other chronic pain: Secondary | ICD-10-CM | POA: Diagnosis not present

## 2023-03-15 DIAGNOSIS — M5459 Other low back pain: Secondary | ICD-10-CM

## 2023-03-15 NOTE — Therapy (Signed)
OUTPATIENT PHYSICAL THERAPY TREATMENT NOTE   Patient Name: Jasmine Willis MRN: [id number] DOB:23-Nov-1965, 57 y.o., female Today's Date: 03/15/2023  PCP: Eustaquio Boyden, MD REFERRING PROVIDER: Juanell Fairly, MD   END OF SESSION:  PT End of Session - 03/15/23 1727     Visit Number 6    Number of Visits 16    Date for PT Re-Evaluation 04/19/23    Authorization Type [employer] reporting period from 02/22/2023    Authorization Time Period ONLY 4 MODALITIES PER DAY  VL: 25 combined PT/OT per year    Authorization - Visit Number 6    Authorization - Number of Visits 25    Progress Note Due on Visit 10    PT Start Time 1730    PT Stop Time 1810    PT Time Calculation (min) 40 min    Activity Tolerance Patient tolerated treatment well;No increased pain    Behavior During Therapy WFL for tasks assessed/performed                Past Medical History:  Diagnosis Date   Anxiety    Primary osteoarthritis of right knee 06/23/2018   R knee xray 05/2016 - Mild joint space narrowing in the medial compartment, mild subchondral sclerosis in the medial tibial plateau   Past Surgical History:  Procedure Laterality Date   NO PAST SURGERIES     Patient Active Problem List   Diagnosis Date Noted   Fall with injury 11/11/2022   Rib injury 11/11/2022   Knee strain, right, initial encounter 11/11/2022   Numbness and tingling in right hand 06/17/2022   Left foot pain 02/25/2022   Sea sickness 05/07/2021   Chronic pain of right knee 11/11/2020   Pain, dental 11/11/2020   Low vitamin B12 level 03/10/2019   Vitamin D deficiency 03/10/2019   Fatigue 06/23/2018   Primary osteoarthritis of right knee 06/23/2018   IDA (iron deficiency anemia) 06/23/2018   Health maintenance examination 08/09/2015   Obesity (BMI 30.0-34.9) 08/09/2015   Depression with anxiety     REFERRING DIAG: other specified arthritis of the right knee  THERAPY DIAG:  Chronic pain of right  knee  Other low back pain  Difficulty in walking, not elsewhere classified  Rationale for Evaluation and Treatment: Rehabilitation  PERTINENT HISTORY: Jasmine Willis is a 57yoF who is referred to OPPT after ongoing difficulty walking, regular limping- pt correlated chronic low back pain and Rt medial knee pain. Most recent lumbar flare earlier this year, was offered 'surgery or injection', she opted for conservative approach and has gotten a lot of help from chiropractic care as well. Pt has been told she has "bone on bone" knee OA, s/p multiple steroid injections, but recommended to try physical therapy at this point. 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   PRECAUTIONS: none  SUBJECTIVE:   SUBJECTIVE STATEMENT: Patient report she is feeling well today. She states she can walk pretty straight when she thinks about it. She states she felt better after last PT session. She does have increased pain in the mornings.   PAIN:  NPRS: 1/10 anterior and posterior right knee pain.    Patient goals: fix her limping, fix her pain in her back and knee. Be able to walk around the hospital.   OBJECTIVE  TODAY'S TREATMENT:   Therapeutic exercise: to centralize symptoms and improve ROM, strength, muscular endurance,  and activity tolerance required for successful completion of functional activities.  - Recumbent Bike level 2. Seat position 11. For improved lower extremity ROM, muscular endurance, and activity tolerance; and to induce the analgesic effect of aerobic exercise, stimulate joint nutrition, and prepare body structures and systems for following interventions. x 5  minutes. Attempting to keep RPM above 80.  HR 131 bpm.  - seated knee extension at [facility] machine, seat position 2: 1x15 with B LE  at 20#; 3x10 each side SLS at 20# (20# too heavy at right). - standing hip hike off 2x4 inch  board, 3x10 each side with U UE support.    Manual therapy: to reduce pain and tissue tension, improve range of motion, neuromodulation, in order to promote improved ability to complete functional activities. HOOKLYING - R patellar mobilizations grade III-IV medal, lateral, superior. 1-2x30-45 seconds each direction.  - R tibiofemoral extension grade III-IV to tolerance. 3x30 seconds each grade.    PATIENT EDUCATION: Education details: Exercise purpose/form. Self management techniques. Education on diagnosis, prognosis, POC, anatomy and physiology of current condition Education on HEP including handout. Reviewed cancelation/no-show policy with patient,  patient verbalized understanding (03/01/23). Person educated: Patient Education method: Medical illustrator Education comprehension: verbalized understanding, returned demonstration, and needs further education  HOME EXERCISE PROGRAM: Access Code: [email or web address] URL: [email or web address] Date: 03/03/2023 Prepared by: Norton Blizzard  Exercises - Long Sitting Quad Set with Towel Roll Under Heel  - 1 x daily - 2 sets - 10 reps - 10 seconds hold - Bridge with Hip Abduction and Resistance - Ground Touches  - 1 x daily - 1 sets - 20 reps - 5 seconds hold - Supine March  - 1 x daily - 7 x weekly - 3 sets - 10 reps - Seated Long Arc Quad  - 1 x daily - 7 x weekly - 3 sets - 10 reps   PT Short Term Goals: target date for all goals 03/23/2023.       PT SHORT TERM GOAL #1   Title Pt to report consistent performance to HEP without symptoms exacerbation and subjective improvements in strength and motor control.    Time Initial HEP provided at IE (02/22/2023)';    Period 4 Weeks    Status MET 03/10/2023   Target Date 03/23/23      PT SHORT TERM GOAL #2   Title Pt to demonstrate tolerance of performance with fewer than 3 standing recovery breaks 2/2 low back pain.    Baseline  To be measured at future visit (02/22/2023);     Period 4 Weeks   Status In-progress   Target Date 03/23/23              PT Long Term Goals: target date for all long term goals is 04/20/2023      PT LONG TERM GOAL #1   Title FOTO survery score improvement by >12 points to indicate improved ability to perform ADL/IADL required mobility tasks.    Baseline: 42 at visit #4 (03/03/2023); 54 at visit #5 (03/10/2023);    Period:  8 Weeks    Status In-progress   Target Date 04/20/23      PT LONG TERM GOAL #2   Title Pt to tolerate 30sec chair rise >13x without Rt knee pain to indicate improved functional use of Rt knee in transfers.    Baseline To be measured at future visit (02/22/2023);    Period 8 Weeks    Status In-progress   Target Date  04/20/23      PT LONG TERM GOAL #3   Title Pt to demonstrate 572ft AMB without valuting gait and without significant abducted limb to improve ability to perform pain-free limited community distance AMB.    Baseline  To be measured at future visit (02/22/2023);    Period 8 Weeks    Status In -progress   Target Date 04/20/23              Plan     Clinical Impression Statement Patient arrives reporting improve walking pattern since last PT session. Patient continued with quad and hip strengthening this session as well as manual therapy to improve R knee extension. Patient would benefit from continued management of limiting condition by skilled physical therapist to address remaining impairments and functional limitations to work towards stated goals and return to PLOF or maximal functional independence.     Personal Factors and Comorbidities Time since onset of injury/illness/exacerbation;Fitness    Examination-Activity Limitations Bend;Lift;Carry;Locomotion Level;Stand;Squat    Examination-Participation Restrictions Cleaning;Occupation    Stability/Clinical Decision Making Evolving/Moderate complexity    Rehab Potential Good    PT Frequency 2x / week    PT Duration 8 weeks    PT  Treatment/Interventions Therapeutic exercise;Neuromuscular re-education;Therapeutic activities;Patient/family education;Manual techniques;Dry needling;Spinal Manipulations;Joint Manipulations;Electrical Stimulation;Iontophoresis 4mg /ml Dexamethasone;Traction;Moist Heat;Gait training;Stair training;ADLs/Self Care Home Management;Cryotherapy;DME Instruction;Passive range of motion    PT Next Visit Plan Progressive LE/core/functional strengthening, motor control, and ROM exercises as tolerated, innominate assessment and MET as needed, manual therapy/joint mobilizations and dry needling as needed. Education.    Consulted and Agree with Plan of Care Patient             Cira Rue, PT, DPT 03/15/2023, 6:12 PM  [facility] [address] [place], [place] [address] P: [phone] I F: [phone]

## 2023-03-17 ENCOUNTER — Encounter: Payer: Self-pay | Admitting: Physical Therapy

## 2023-03-17 ENCOUNTER — Ambulatory Visit: Payer: Commercial Managed Care - PPO | Admitting: Physical Therapy

## 2023-03-17 ENCOUNTER — Encounter: Payer: Commercial Managed Care - PPO | Admitting: Physical Therapy

## 2023-03-17 DIAGNOSIS — R262 Difficulty in walking, not elsewhere classified: Secondary | ICD-10-CM

## 2023-03-17 DIAGNOSIS — M5459 Other low back pain: Secondary | ICD-10-CM | POA: Diagnosis not present

## 2023-03-17 DIAGNOSIS — M25561 Pain in right knee: Secondary | ICD-10-CM | POA: Diagnosis not present

## 2023-03-17 DIAGNOSIS — G8929 Other chronic pain: Secondary | ICD-10-CM | POA: Diagnosis not present

## 2023-03-17 NOTE — Therapy (Signed)
OUTPATIENT PHYSICAL THERAPY TREATMENT NOTE   Patient Name: Jasmine Willis MRN: [id number] DOB:[birth year], 57 y.o., female Today's Date: 03/17/2023  PCP: Eustaquio Boyden, MD REFERRING PROVIDER: Juanell Fairly, MD   END OF SESSION:  PT End of Session - 03/17/23 1736     Visit Number 7    Number of Visits 16    Date for PT Re-Evaluation 04/19/23    Authorization Type [employer] reporting period from 02/22/2023    Authorization Time Period ONLY 4 MODALITIES PER DAY  VL: 25 combined PT/OT per year    Authorization - Visit Number 7    Authorization - Number of Visits 25    Progress Note Due on Visit 10    PT Start Time 1734    PT Stop Time 1814    PT Time Calculation (min) 40 min    Activity Tolerance Patient tolerated treatment well;No increased pain    Behavior During Therapy WFL for tasks assessed/performed                 Past Medical History:  Diagnosis Date   Anxiety    Primary osteoarthritis of right knee 06/23/2018   R knee xray 05/2016 - Mild joint space narrowing in the medial compartment, mild subchondral sclerosis in the medial tibial plateau   Past Surgical History:  Procedure Laterality Date   NO PAST SURGERIES     Patient Active Problem List   Diagnosis Date Noted   Fall with injury 11/11/2022   Rib injury 11/11/2022   Knee strain, right, initial encounter 11/11/2022   Numbness and tingling in right hand 06/17/2022   Left foot pain 02/25/2022   Sea sickness 05/07/2021   Chronic pain of right knee 11/11/2020   Pain, dental 11/11/2020   Low vitamin B12 level 03/10/2019   Vitamin D deficiency 03/10/2019   Fatigue 06/23/2018   Primary osteoarthritis of right knee 06/23/2018   IDA (iron deficiency anemia) 06/23/2018   Health maintenance examination 08/09/2015   Obesity (BMI 30.0-34.9) 08/09/2015   Depression with anxiety     REFERRING DIAG: other specified arthritis of the right knee  THERAPY DIAG:  Chronic pain of right  knee  Other low back pain  Difficulty in walking, not elsewhere classified  Rationale for Evaluation and Treatment: Rehabilitation  PERTINENT HISTORY: Jasmine Willis is a 57yoF who is referred to OPPT after ongoing difficulty walking, regular limping- pt correlated chronic low back pain and Rt medial knee pain. Most recent lumbar flare earlier this year, was offered 'surgery or injection', she opted for conservative approach and has gotten a lot of help from chiropractic care as well. Pt has been told she has "bone on bone" knee OA, s/p multiple steroid injections, but recommended to try physical therapy at this point. 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   PRECAUTIONS: none  SUBJECTIVE:   SUBJECTIVE STATEMENT: Patient reports her right knee felt good today but it hurt a lot yesterday when she forgot to take her gabapentin and muscle relaxant. Her pain went up to 4/10 yesterday.   PAIN:  NPRS: 1/10 anterior and posterior right knee pain. 3/10 in low back.   Patient goals: fix her limping, fix her pain in her back and knee. Be able to walk around the hospital.   OBJECTIVE  TODAY'S TREATMENT:   Therapeutic exercise: to centralize symptoms and improve ROM, strength, muscular endurance, and  activity tolerance required for successful completion of functional activities.  - Recumbent Bike level 2. Seat position 11. For improved lower extremity ROM, muscular endurance, and activity tolerance; and to induce the analgesic effect of aerobic exercise, stimulate joint nutrition, and prepare body structures and systems for following interventions. x 5  minutes. Attempting to keep RPM above 80.   - seated knee extension at [facility] machine, seat position 2: 1x15 with B LE  at 20#; 3x10 each side SLS at 20#. 1x20 at 20#.  - lumbar extension against high plinth, 1x10 (improved low back pain) -  standing hip hike off floor with one knee flexed, 3x10 each side with U UE support. - standing spanish quarter squats, 2x10 (increasing pain at low back) - lumbar extension against high plinth, 1x10 (improved low back pain) - Squats with BUE support focusing on glute activation and proper form with hip hinging, stabilized back, and tibial perpendicular to floor with knees behind toes. 1x10 - standing R TKE, 1x15 with 5 second hold - Education on HEP including handout   Manual therapy: to reduce pain and tissue tension, improve range of motion, neuromodulation, in order to promote improved ability to complete functional activities. SITTING - R knee PROM with distraction through tibiofemoral joint throughout 0-90 degrees ROM, 1x10  Pt required multimodal cuing for proper technique and to facilitate improved neuromuscular control, strength, range of motion, and functional ability resulting in improved performance and form.   PATIENT EDUCATION: Education details: Exercise purpose/form. Self management techniques. Education on diagnosis, prognosis, POC, anatomy and physiology of current condition Education on HEP including handout. Reviewed cancelation/no-show policy with patient,  patient verbalized understanding (03/01/23). Person educated: Patient Education method: Medical illustrator Education comprehension: verbalized understanding, returned demonstration, and needs further education  HOME EXERCISE PROGRAM: Access Code: [email or web address] URL: [email or web address] Date: 03/17/2023 Prepared by: Norton Blizzard  Exercises - Long Sitting Quad Set with Towel Roll Under Heel  - 1 x daily - 2 sets - 10 reps - 10 seconds hold - Bridge with Hip Abduction and Resistance - Ground Touches  - 1 x daily - 1 sets - 20 reps - 5 seconds hold - Supine March  - 1 x daily - 3-5 x weekly - 3 sets - 10 reps - Seated Long Arc Quad  - 1 x daily - 3-5 x weekly - 3 sets - 10 reps - Squat with  Counter Support  - 3-5 x weekly - 3 sets - 10 reps - Standing Hip Hiking  - 3-5 x weekly - 3 sets - 10 reps - Standing Terminal Knee Extension with Resistance  - 3-5 x weekly - 2-3 sets - 10 reps - 5 seconds hold   PT Short Term Goals: target date for all goals 03/23/2023.       PT SHORT TERM GOAL #1   Title Pt to report consistent performance to HEP without symptoms exacerbation and subjective improvements in strength and motor control.    Time Initial HEP provided at IE (02/22/2023)';    Period 4 Weeks    Status MET 03/10/2023   Target Date 03/23/23      PT SHORT TERM GOAL #2   Title Pt to demonstrate tolerance of performance with fewer than 3 standing recovery breaks 2/2 low back pain.    Baseline  To be measured at future visit (02/22/2023);    Period 4 Weeks   Status In-progress   Target Date 03/23/23  PT Long Term Goals: target date for all long term goals is 04/20/2023      PT LONG TERM GOAL #1   Title FOTO survery score improvement by >12 points to indicate improved ability to perform ADL/IADL required mobility tasks.    Baseline: 42 at visit #4 (03/03/2023); 54 at visit #5 (03/10/2023);    Period:  8 Weeks    Status In-progress   Target Date 04/20/23      PT LONG TERM GOAL #2   Title Pt to tolerate 30sec chair rise >13x without Rt knee pain to indicate improved functional use of Rt knee in transfers.    Baseline To be measured at future visit (02/22/2023);    Period 8 Weeks    Status In-progress   Target Date 04/20/23      PT LONG TERM GOAL #3   Title Pt to demonstrate 5106ft AMB without valuting gait and without significant abducted limb to improve ability to perform pain-free limited community distance AMB.    Baseline  To be measured at future visit (02/22/2023);    Period 8 Weeks    Status In -progress   Target Date 04/20/23              Plan     Clinical Impression Statement Patient arrives reporting increased pain yesterday that she feels  is appropriate for the exercise she is doing to get better. Today's session focused on improving quad strength including progression to squats. Patient reported feeling increased pain by end of session but continues to be satisfied with treatment with the attitude that the discomfort is part of getting stronger. Patient shows great interest in performing some newer exercises at home so HEP was updated to include handouts for these exercises. She had some increased low back pain with spanish squats that was relieved with standing lumbar extension. Plan to continue with quad, hip, and functional strengthening as appropriate next session. Patient would benefit from continued management of limiting condition by skilled physical therapist to address remaining impairments and functional limitations to work towards stated goals and return to PLOF or maximal functional independence.     Personal Factors and Comorbidities Time since onset of injury/illness/exacerbation;Fitness    Examination-Activity Limitations Bend;Lift;Carry;Locomotion Level;Stand;Squat    Examination-Participation Restrictions Cleaning;Occupation    Stability/Clinical Decision Making Evolving/Moderate complexity    Rehab Potential Good    PT Frequency 2x / week    PT Duration 8 weeks    PT Treatment/Interventions Therapeutic exercise;Neuromuscular re-education;Therapeutic activities;Patient/family education;Manual techniques;Dry needling;Spinal Manipulations;Joint Manipulations;Electrical Stimulation;Iontophoresis 4mg /ml Dexamethasone;Traction;Moist Heat;Gait training;Stair training;ADLs/Self Care Home Management;Cryotherapy;DME Instruction;Passive range of motion    PT Next Visit Plan Progressive LE/core/functional strengthening, motor control, and ROM exercises as tolerated, innominate assessment and MET as needed, manual therapy/joint mobilizations and dry needling as needed. Education.    Consulted and Agree with Plan of Care Patient              Cira Rue, PT, DPT 03/17/2023, 6:28 PM  [facility] [address] [place], [place] [address] P: [phone] I F: [phone]

## 2023-03-22 ENCOUNTER — Encounter: Payer: Commercial Managed Care - PPO | Admitting: Physical Therapy

## 2023-03-22 ENCOUNTER — Ambulatory Visit: Payer: Commercial Managed Care - PPO

## 2023-03-22 ENCOUNTER — Encounter: Payer: Self-pay | Admitting: Physical Therapy

## 2023-03-22 DIAGNOSIS — M5459 Other low back pain: Secondary | ICD-10-CM | POA: Diagnosis not present

## 2023-03-22 DIAGNOSIS — R262 Difficulty in walking, not elsewhere classified: Secondary | ICD-10-CM | POA: Diagnosis not present

## 2023-03-22 DIAGNOSIS — M25561 Pain in right knee: Secondary | ICD-10-CM | POA: Diagnosis not present

## 2023-03-22 DIAGNOSIS — G8929 Other chronic pain: Secondary | ICD-10-CM

## 2023-03-22 NOTE — Therapy (Signed)
OUTPATIENT PHYSICAL THERAPY TREATMENT NOTE & DISCHARGE   Patient Name: Jasmine Willis MRN: [id number] DOB:1965-10-18, 57 y.o., female Today's Date: 03/22/2023  PCP: Eustaquio Boyden, MD REFERRING PROVIDER: Juanell Fairly, MD   END OF SESSION:  PT End of Session - 03/22/23 1728     Visit Number 8    Number of Visits 16    Date for PT Re-Evaluation 04/19/23    Authorization Type [employer] reporting period from 02/22/2023    Authorization Time Period ONLY 4 MODALITIES PER DAY  VL: 25 combined PT/OT per year    Authorization - Number of Visits 25    Progress Note Due on Visit 10    PT Start Time 1730    PT Stop Time 1811    PT Time Calculation (min) 41 min    Activity Tolerance Patient tolerated treatment well;No increased pain    Behavior During Therapy WFL for tasks assessed/performed                  Past Medical History:  Diagnosis Date   Anxiety    Primary osteoarthritis of right knee 06/23/2018   R knee xray 05/2016 - Mild joint space narrowing in the medial compartment, mild subchondral sclerosis in the medial tibial plateau   Past Surgical History:  Procedure Laterality Date   NO PAST SURGERIES     Patient Active Problem List   Diagnosis Date Noted   Fall with injury 11/11/2022   Rib injury 11/11/2022   Knee strain, right, initial encounter 11/11/2022   Numbness and tingling in right hand 06/17/2022   Left foot pain 02/25/2022   Sea sickness 05/07/2021   Chronic pain of right knee 11/11/2020   Pain, dental 11/11/2020   Low vitamin B12 level 03/10/2019   Vitamin D deficiency 03/10/2019   Fatigue 06/23/2018   Primary osteoarthritis of right knee 06/23/2018   IDA (iron deficiency anemia) 06/23/2018   Health maintenance examination 08/09/2015   Obesity (BMI 30.0-34.9) 08/09/2015   Depression with anxiety     REFERRING DIAG: other specified arthritis of the right knee  THERAPY DIAG:  Chronic pain of right knee  Other low back  pain  Difficulty in walking, not elsewhere classified  Rationale for Evaluation and Treatment: Rehabilitation  PERTINENT HISTORY: Jasmine Willis is a 57yoF who is referred to OPPT after ongoing difficulty walking, regular limping- pt correlated chronic low back pain and Rt medial knee pain. Most recent lumbar flare earlier this year, was offered 'surgery or injection', she opted for conservative approach and has gotten a lot of help from chiropractic care as well. Pt has been told she has "bone on bone" knee OA, s/p multiple steroid injections, but recommended to try physical therapy at this point. 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   PRECAUTIONS: none  SUBJECTIVE:   SUBJECTIVE STATEMENT: Patient feels like she is making good improvement and would like for today to be last PT session.   PAIN:  NPRS: 1/10 anterior and posterior right knee pain. 3/10 in low back.   Patient goals: fix her limping, fix her pain in her back and knee. Be able to walk around the hospital.   OBJECTIVE  TODAY'S TREATMENT:     Goals were addressed for discharge and new HEP handout was provided and reviewed.   Therapeutic exercise: to centralize symptoms and improve ROM, strength, muscular endurance, and activity  tolerance required for successful completion of functional activities.  - Recumbent Bike level 3. Seat position 11. For improved lower extremity ROM, muscular endurance, and activity tolerance; and to induce the analgesic effect of aerobic exercise, stimulate joint nutrition, and prepare body structures and systems for following interventions. x 5  minutes. Attempting to keep RPM above 80.   -seated thoracic extension 10 x 10 second holds  - Education on HEP including handout, seebbelow  Pt required multimodal cuing for proper technique and to facilitate improved neuromuscular control,  strength, range of motion, and functional ability resulting in improved performance and form.   PATIENT EDUCATION: Education details: Exercise purpose/form. Self management techniques. Education on diagnosis, prognosis, POC, anatomy and physiology of current condition Education on HEP including handout. Reviewed cancelation/no-show policy with patient,  patient verbalized understanding (03/01/23). Person educated: Patient Education method: Medical illustrator Education comprehension: verbalized understanding, returned demonstration, and needs further education  HOME EXERCISE PROGRAM: Access Code: [email or web address] URL: [email or web address] Date: 03/17/2023 Prepared by: Norton Blizzard  Exercises - Long Sitting Quad Set with Towel Roll Under Heel  - 1 x daily - 2 sets - 10 reps - 10 seconds hold - Bridge with Hip Abduction and Resistance - Ground Touches  - 1 x daily - 1 sets - 20 reps - 5 seconds hold - Supine March  - 1 x daily - 3-5 x weekly - 3 sets - 10 reps - Seated Long Arc Quad  - 1 x daily - 3-5 x weekly - 3 sets - 10 reps - Squat with Counter Support  - 3-5 x weekly - 3 sets - 10 reps - Standing Hip Hiking  - 3-5 x weekly - 3 sets - 10 reps - Standing Terminal Knee Extension with Resistance  - 3-5 x weekly - 2-3 sets - 10 reps - 5 seconds hold  Access Code: [email or web address] URL: [email or web address] Date: 03/22/2023 Prepared by: Maylon Peppers  Exercises - Standing March with Unilateral Counter Support  - 2-3 x daily - 5-7 x weekly - 3 sets - 10 reps - Standing Knee Flexion with Unilateral Counter Support  - 2-3 x daily - 5-7 x weekly - 3 sets - 10 reps - Standing Hip Abduction with Unilateral Counter Support  - 2-3 x daily - 5-7 x weekly - 3 sets - 10 reps - Heel Toe Raises with Unilateral Counter Support  - 2-3 x daily - 5-7 x weekly - 3 sets - 10 reps   PT Short Term Goals: target date for all goals 03/23/2023.       PT SHORT TERM GOAL #1   Title  Pt to report consistent performance to HEP without symptoms exacerbation and subjective improvements in strength and motor control.    Time Initial HEP provided at IE (02/22/2023)';    Period 4 Weeks    Status MET 03/10/2023   Target Date 03/23/23      PT SHORT TERM GOAL #2   Title Pt to demonstrate tolerance of performance with fewer than 3 standing recovery breaks 2/2 low back pain.    Baseline  To be measured at future visit (02/22/2023); 7/29: 975 feet with no standing rest breaks    Period 4 Weeks   Status MET 7/29   Target Date 03/23/23              PT Long Term Goals: target date for all long term goals is 04/20/2023      PT LONG TERM GOAL #1   Title  FOTO survery score improvement by >12 points to indicate improved ability to perform ADL/IADL required mobility tasks.    Baseline: 42 at visit #4 (03/03/2023); 54 at visit #5; (03/10/2023); 7/29: 52   Period:  8 Weeks    Status MET on 7/10   Target Date 04/20/23      PT LONG TERM GOAL #2   Title Pt to tolerate 30sec chair rise >13x without Rt knee pain to indicate improved functional use of Rt knee in transfers.    Baseline To be measured at future visit (02/22/2023); 7/29: 15    Period 8 Weeks    Status MET 7/29   Target Date 04/20/23      PT LONG TERM GOAL #3   Title Pt to demonstrate 567ft AMB without valuting gait and without significant abducted limb to improve ability to perform pain-free limited community distance AMB.    Baseline  To be measured at future visit (02/22/2023); 7/29: able to ambulate >500' with mild decreased stance time on R   Period 8 Weeks    Status MET 7/29   Target Date 04/20/23              Plan     Clinical Impression Statement  Patient has requested discharge due to improvement since beginning therapy as well as financial reasons. Patient has met all goals for physical therapy. She has shown improvement in activity tolerance with no standing rest breaks during and increased her 30  second chair stand. She continues to demonstrate decreased stance time on R during ambulation but has improved in gait quality since beginning of PT. Patient will be discharged from PT services. Please re-consult if necessary.    Personal Factors and Comorbidities Time since onset of injury/illness/exacerbation;Fitness    Examination-Activity Limitations Bend;Lift;Carry;Locomotion Level;Stand;Squat    Examination-Participation Restrictions Cleaning;Occupation    Stability/Clinical Decision Making Evolving/Moderate complexity    Rehab Potential Good    PT Frequency 2x / week    PT Duration 8 weeks    PT Treatment/Interventions Therapeutic exercise;Neuromuscular re-education;Therapeutic activities;Patient/family education;Manual techniques;Dry needling;Spinal Manipulations;Joint Manipulations;Electrical Stimulation;Iontophoresis 4mg /ml Dexamethasone;Traction;Moist Heat;Gait training;Stair training;ADLs/Self Care Home Management;Cryotherapy;DME Instruction;Passive range of motion    PT Next Visit Plan Discharge   Consulted and Agree with Plan of Care Patient             Viviann Spare, PT, DPT 03/22/2023, 6:13 PM  [facility] [address] [place], [place] [address] P: [phone] I F: [phone]

## 2023-03-24 ENCOUNTER — Ambulatory Visit: Payer: Commercial Managed Care - PPO | Admitting: Physical Therapy

## 2023-03-24 ENCOUNTER — Encounter: Payer: Commercial Managed Care - PPO | Admitting: Physical Therapy

## 2023-03-29 ENCOUNTER — Ambulatory Visit: Payer: Commercial Managed Care - PPO | Admitting: Physical Therapy

## 2023-03-29 ENCOUNTER — Encounter: Payer: Commercial Managed Care - PPO | Admitting: Physical Therapy

## 2023-03-31 ENCOUNTER — Encounter: Payer: Commercial Managed Care - PPO | Admitting: Physical Therapy

## 2023-04-05 ENCOUNTER — Encounter: Payer: Commercial Managed Care - PPO | Admitting: Physical Therapy

## 2023-04-07 ENCOUNTER — Encounter: Payer: Commercial Managed Care - PPO | Admitting: Physical Therapy

## 2023-04-11 MED FILL — Phentermine HCl Cap 30 MG: ORAL | 30 days supply | Qty: 30 | Fill #0 | Status: AC

## 2023-04-11 MED FILL — Methocarbamol Tab 500 MG: ORAL | 30 days supply | Qty: 60 | Fill #1 | Status: AC

## 2023-04-11 MED FILL — Diclofenac Sodium Tab Delayed Release 75 MG: ORAL | 30 days supply | Qty: 30 | Fill #1 | Status: AC

## 2023-04-12 ENCOUNTER — Other Ambulatory Visit: Payer: Self-pay

## 2023-04-12 ENCOUNTER — Encounter: Payer: Commercial Managed Care - PPO | Admitting: Physical Therapy

## 2023-04-14 ENCOUNTER — Other Ambulatory Visit: Payer: Self-pay

## 2023-04-14 ENCOUNTER — Encounter: Payer: Commercial Managed Care - PPO | Admitting: Physical Therapy

## 2023-04-19 ENCOUNTER — Encounter: Payer: Commercial Managed Care - PPO | Admitting: Physical Therapy

## 2023-04-21 ENCOUNTER — Encounter: Payer: Commercial Managed Care - PPO | Admitting: Physical Therapy

## 2023-05-01 ENCOUNTER — Other Ambulatory Visit: Payer: Self-pay | Admitting: Family Medicine

## 2023-05-01 DIAGNOSIS — M1711 Unilateral primary osteoarthritis, right knee: Secondary | ICD-10-CM

## 2023-05-02 ENCOUNTER — Other Ambulatory Visit: Payer: Self-pay | Admitting: Family Medicine

## 2023-05-02 ENCOUNTER — Other Ambulatory Visit: Payer: Self-pay

## 2023-05-02 DIAGNOSIS — M1711 Unilateral primary osteoarthritis, right knee: Secondary | ICD-10-CM

## 2023-05-03 NOTE — Telephone Encounter (Signed)
Name of Medication: Phentermine, Tramadol Name of Pharmacy: [facility] Last Fill or Written Date and Quantity:  03/09/23, #20 Last Office Visit and Type: 11/11/22, fall Next Office Visit and Type: none Last Controlled Substance Agreement Date: none Last UDS: none

## 2023-05-04 ENCOUNTER — Other Ambulatory Visit: Payer: Self-pay

## 2023-05-04 MED FILL — Tramadol HCl Tab 50 MG: ORAL | 7 days supply | Qty: 20 | Fill #0 | Status: AC

## 2023-05-04 NOTE — Telephone Encounter (Signed)
ERx 

## 2023-05-11 ENCOUNTER — Other Ambulatory Visit: Payer: Self-pay

## 2023-05-14 MED FILL — Methocarbamol Tab 500 MG: ORAL | 30 days supply | Qty: 60 | Fill #2 | Status: AC

## 2023-05-14 MED FILL — Diclofenac Sodium Tab Delayed Release 75 MG: ORAL | 30 days supply | Qty: 30 | Fill #2 | Status: AC

## 2023-05-21 DIAGNOSIS — Z01411 Encounter for gynecological examination (general) (routine) with abnormal findings: Secondary | ICD-10-CM | POA: Diagnosis not present

## 2023-05-21 DIAGNOSIS — M25511 Pain in right shoulder: Secondary | ICD-10-CM | POA: Diagnosis not present

## 2023-05-21 DIAGNOSIS — M549 Dorsalgia, unspecified: Secondary | ICD-10-CM | POA: Diagnosis not present

## 2023-05-21 DIAGNOSIS — M25512 Pain in left shoulder: Secondary | ICD-10-CM | POA: Diagnosis not present

## 2023-05-21 DIAGNOSIS — Z124 Encounter for screening for malignant neoplasm of cervix: Secondary | ICD-10-CM | POA: Diagnosis not present

## 2023-05-21 DIAGNOSIS — G8929 Other chronic pain: Secondary | ICD-10-CM | POA: Diagnosis not present

## 2023-05-25 ENCOUNTER — Other Ambulatory Visit: Payer: Self-pay

## 2023-05-25 ENCOUNTER — Other Ambulatory Visit: Payer: Self-pay | Admitting: Family Medicine

## 2023-05-25 DIAGNOSIS — E66811 Obesity, class 1: Secondary | ICD-10-CM

## 2023-05-25 DIAGNOSIS — M1711 Unilateral primary osteoarthritis, right knee: Secondary | ICD-10-CM

## 2023-05-25 MED FILL — Methocarbamol Tab 500 MG: ORAL | 30 days supply | Qty: 60 | Fill #3 | Status: CN

## 2023-05-25 MED FILL — Hydroxyzine HCl Tab 25 MG: ORAL | 15 days supply | Qty: 30 | Fill #1 | Status: AC

## 2023-05-25 MED FILL — Diclofenac Sodium Tab Delayed Release 75 MG: ORAL | 30 days supply | Qty: 30 | Fill #3 | Status: CN

## 2023-05-26 ENCOUNTER — Other Ambulatory Visit: Payer: Self-pay

## 2023-05-26 MED FILL — Tramadol HCl Tab 50 MG: ORAL | 7 days supply | Qty: 20 | Fill #0 | Status: AC

## 2023-05-26 MED FILL — Phentermine HCl Cap 30 MG: ORAL | 30 days supply | Qty: 30 | Fill #0 | Status: AC

## 2023-05-26 NOTE — Telephone Encounter (Signed)
ERx. Plz schedule OV for weight management.

## 2023-05-26 NOTE — Telephone Encounter (Signed)
Spoke with pt scheduling f/u OV on 05/31/23 at 9:00.

## 2023-05-31 ENCOUNTER — Ambulatory Visit: Payer: Commercial Managed Care - PPO | Admitting: Family Medicine

## 2023-05-31 ENCOUNTER — Other Ambulatory Visit: Payer: Self-pay

## 2023-05-31 ENCOUNTER — Encounter: Payer: Self-pay | Admitting: Family Medicine

## 2023-05-31 ENCOUNTER — Telehealth: Payer: Self-pay | Admitting: Family Medicine

## 2023-05-31 VITALS — BP 126/88 | HR 76 | Temp 97.7°F | Ht 68.0 in | Wt 200.0 lb

## 2023-05-31 DIAGNOSIS — M546 Pain in thoracic spine: Secondary | ICD-10-CM | POA: Diagnosis not present

## 2023-05-31 DIAGNOSIS — E66811 Obesity, class 1: Secondary | ICD-10-CM | POA: Diagnosis not present

## 2023-05-31 DIAGNOSIS — G8929 Other chronic pain: Secondary | ICD-10-CM | POA: Insufficient documentation

## 2023-05-31 DIAGNOSIS — M1711 Unilateral primary osteoarthritis, right knee: Secondary | ICD-10-CM | POA: Diagnosis not present

## 2023-05-31 MED ORDER — TIRZEPATIDE-WEIGHT MANAGEMENT 5 MG/0.5ML ~~LOC~~ SOAJ
5.0000 mg | SUBCUTANEOUS | 1 refills | Status: DC
  Filled 2023-05-31: qty 2, 28d supply, fill #0

## 2023-05-31 MED ORDER — TIRZEPATIDE-WEIGHT MANAGEMENT 2.5 MG/0.5ML ~~LOC~~ SOAJ
2.5000 mg | SUBCUTANEOUS | 0 refills | Status: DC
  Filled 2023-05-31: qty 2, 28d supply, fill #0

## 2023-05-31 NOTE — Telephone Encounter (Signed)
Lvm asking pt to call back. Need to relay Dr Timoteo Expose message and schedule CPE after 10/13/23.

## 2023-05-31 NOTE — Patient Instructions (Signed)
Stop phentermine Trial zepbound 2.5mg  weekly for the first month then increase to 5mg  weekly - we will continue this lower dose as previously 7.5mg  caused nausea.  Return in 6-8 weeks for follow up weight visit

## 2023-05-31 NOTE — Telephone Encounter (Addendum)
Alternatively she can contact a Zepbound pharmacy: [facility] [place], [place] - [address] [phone]   As they may have more affordable Zepbound prescription option if we directly go through their pharmacy. Let me know what she finds out.

## 2023-05-31 NOTE — Assessment & Plan Note (Signed)
On phentermine since 11/2022 with 7 lb weight loss.  Patient is interested in GIP/GLP1RA tirzepatide (Zepbound). Reviewed mechanism of action of medication as well as side effects and adverse events to watch for including nausea, diarrhea, constipation, pancreatitis. No fmhx medullary thyroid cancer or MEN2. Discussed titration schedule for medication. Will start 2.5mg  x1 month then increase to 5mg  weekly dose, likely stay at this dose given prior nausea on 7.5mg . Discussed need for regular visits for weight management to monitor medication effect and tolerance and weight loss, rec return 6-8 wks after starting medication.

## 2023-05-31 NOTE — Telephone Encounter (Signed)
Received notice that Zepbound is currently not covered by her insurance.  Anticipate will be unaffordable even with coupon but she can check online for this.  Alternative options include Contrave vs referral to healthy weight and wellness center in [place] (medically supervised weight loss clinic).  Would also have her schedule CPE after 10/13/2023   No results found for: "HGBA1C"  Lab Results  Component Value Date   NA 141 10/13/2022   CL 105 10/13/2022   K 4.4 10/13/2022   CO2 25 10/13/2022   BUN 14 10/13/2022   CREATININE 0.93 10/13/2022   GFR 69.56 10/13/2022   CALCIUM 9.6 10/13/2022   ALBUMIN 4.3 10/13/2022   GLUCOSE 91 10/13/2022

## 2023-05-31 NOTE — Assessment & Plan Note (Signed)
Appreciate ortho care Jasmine Willis). Planning to return to ortho for f/u after outpatient PT course.

## 2023-05-31 NOTE — Assessment & Plan Note (Signed)
Has been referred to plastic surgery by GYN to discuss breast reduction surgery.

## 2023-05-31 NOTE — Progress Notes (Signed)
Ph: [phone] Fax: [phone]   [name], female    DOB: [birth year], 57 y.o.   MRN: [id number]  This visit was conducted in person.  BP 126/88   Pulse 76   Temp 97.7 F (36.5 C) (Oral)   Ht 5\' 8"  (1.727 m)   Wt 200 lb (90.7 kg)   SpO2 99%   BMI 30.41 kg/m    CC: weight management  Subjective:   HPI: Jasmine Willis is a 57 y.o. female presenting on 05/31/2023 for Medical Management of Chronic Issues (Here for wt mgmt f/u.)   Starting weight: 207 lbs Last weight: 207 lbs Today's weight 200 lbs  Phentermine 30mg  daily restarted 11/2022, filled 12/2022, 01/2023, 02/223, and latest 05/26/2023.   Intolerant to other meds tried - saxenda (nausea), Zepbound (nausea). She would be interested in retrial.  Contrave was unaffordable.   No fmhx thyroid cancer, no fmhx MEN2.   Recent well woman exam with pap - referred to plastic surgery for consideration of breast reduction surgery. Ongoing thoracic back pain.   24 hour recall: 8am breakfast - sausage part of [employer] mcmuffin, nothing to drink  1pm lunch - 1 fried chicken with cooked apples and mashed potatoes/gravy, water Snack - Dr Reino Kent, popcorn  7:30pm dinner - cheeseburger with onions, flavored water   Activity regimen: Activity limited by R knee osteoarthritis - s/p outpatient PT course completed 02/2023. Sees Dr Martha Clan      Relevant past medical, surgical, family and social history reviewed and updated as indicated. Interim medical history since our last visit reviewed. Allergies and medications reviewed and updated. Outpatient Medications Prior to Visit  Medication Sig Dispense Refill   buPROPion ER (WELLBUTRIN SR) 100 MG 12 hr tablet Take 1 tablet (100 mg total) by mouth daily. 90 tablet 4   diclofenac (VOLTAREN) 75 MG EC tablet Take 1 tablet (75 mg total) by mouth daily. 30 tablet 3   gabapentin (NEURONTIN) 100 MG capsule Take 1 capsule (100 mg total) by mouth 2 (two) times daily. 180 capsule  3   hydrOXYzine (ATARAX) 25 MG tablet Take 1 tablet (25 mg total) by mouth 2 (two) times daily as needed for anxiety. 30 tablet 1   methocarbamol (ROBAXIN) 500 MG tablet Take 1 tablet (500 mg total) by mouth 2 (two) times daily. 60 tablet 3   ondansetron (ZOFRAN) 4 MG tablet Take 1 tablet (4 mg total) by mouth every 8 (eight) hours as needed for nausea or vomiting. 20 tablet 0   phentermine 30 MG capsule Take 1 capsule (30 mg total) by mouth every morning. 30 capsule 0   traMADol (ULTRAM) 50 MG tablet Take 1 tablet (50 mg total) by mouth every 8 (eight) hours as needed for moderate pain (sedation pecautions). 20 tablet 0   dibucaine (NUPERCAINAL) 1 % ointment Apply 1 Application topically to rectum 3 (three) to 4 (four) times daily as needed for rectal pain. 28 g 0   hydrocortisone (ANUSOL-HC) 25 MG suppository Place 1 suppository (25 mg total) rectally every 12 (twelve) hours as needed for rectal pain. 12 suppository 0   No facility-administered medications prior to visit.     Per HPI unless specifically indicated in ROS section below Review of Systems  Objective:  BP 126/88   Pulse 76   Temp 97.7 F (36.5 C) (Oral)   Ht 5\' 8"  (1.727 m)   Wt 200 lb (90.7 kg)   SpO2 99%   BMI 30.41 kg/m  Wt Readings from Last 3 Encounters:  05/31/23 200 lb (90.7 kg)  11/11/22 207 lb 2 oz (94 kg)  10/13/22 204 lb 4 oz (92.6 kg)      Physical Exam Vitals and nursing note reviewed.  Constitutional:      Appearance: Normal appearance. She is not ill-appearing.  HENT:     Mouth/Throat:     Mouth: Mucous membranes are moist.     Pharynx: Oropharynx is clear. No oropharyngeal exudate or posterior oropharyngeal erythema.  Cardiovascular:     Rate and Rhythm: Normal rate and regular rhythm.     Pulses: Normal pulses.     Heart sounds: Normal heart sounds. No murmur heard. Pulmonary:     Effort: Pulmonary effort is normal. No respiratory distress.     Breath sounds: Normal breath sounds. No  wheezing, rhonchi or rales.  Musculoskeletal:     Right lower leg: No edema.     Left lower leg: No edema.  Skin:    General: Skin is warm and dry.     Findings: No rash.  Neurological:     Mental Status: She is alert.       Results for orders placed or performed in visit on 10/16/22  HM PAP SMEAR  Result Value Ref Range   HM Pap smear Negative for intraepithelial lesion or malignancy   Results Console HPV  Result Value Ref Range   CHL HPV Negative     Assessment & Plan:   Problem List Items Addressed This Visit     Obesity (BMI 30.0-34.9) - Primary    On phentermine since 11/2022 with 7 lb weight loss.  Patient is interested in GIP/GLP1RA tirzepatide (Zepbound). Reviewed mechanism of action of medication as well as side effects and adverse events to watch for including nausea, diarrhea, constipation, pancreatitis. No fmhx medullary thyroid cancer or MEN2. Discussed titration schedule for medication. Will start 2.5mg  x1 month then increase to 5mg  weekly dose, likely stay at this dose given prior nausea on 7.5mg . Discussed need for regular visits for weight management to monitor medication effect and tolerance and weight loss, rec return 6-8 wks after starting medication.       Primary osteoarthritis of right knee    Appreciate ortho care Martha Clan). Planning to return to ortho for f/u after outpatient PT course.       Chronic thoracic back pain    Has been referred to plastic surgery by GYN to discuss breast reduction surgery.         Meds ordered this encounter  Medications   tirzepatide (ZEPBOUND) 2.5 MG/0.5ML injection vial    Sig: Inject 2.5 mg into the skin once a week.    Dispense:  2 mL    Refill:  0   tirzepatide 5 MG/0.5ML injection vial    Sig: Inject 5 mg into the skin once a week.    Dispense:  2 mL    Refill:  1    No orders of the defined types were placed in this encounter.   Patient Instructions  Stop phentermine Trial zepbound 2.5mg  weekly for  the first month then increase to 5mg  weekly - we will continue this lower dose as previously 7.5mg  caused nausea.  Return in 6-8 weeks for follow up weight visit   Follow up plan: Return in about 6 weeks (around 07/12/2023) for follow up visit.  Eustaquio Boyden, MD

## 2023-06-01 ENCOUNTER — Encounter: Payer: Self-pay | Admitting: Family Medicine

## 2023-06-01 ENCOUNTER — Other Ambulatory Visit: Payer: Self-pay

## 2023-06-01 MED ORDER — TIRZEPATIDE-WEIGHT MANAGEMENT 2.5 MG/0.5ML ~~LOC~~ SOAJ
2.5000 mg | SUBCUTANEOUS | 0 refills | Status: DC
  Filled 2023-06-01: qty 2, 28d supply, fill #0

## 2023-06-01 NOTE — Telephone Encounter (Addendum)
Received faxed message from [employer] requesting dx code be attached to rx and resent.   I attached dx code: Obesity (BMI 30.0- 34.9) E66.811 to rx and resent. Fyi to Dr Reece Agar

## 2023-06-01 NOTE — Addendum Note (Signed)
Addended by: Nanci Pina on: 06/01/2023 12:49 PM   Modules accepted: Orders

## 2023-06-10 ENCOUNTER — Other Ambulatory Visit: Payer: Self-pay | Admitting: Family Medicine

## 2023-06-10 DIAGNOSIS — M1711 Unilateral primary osteoarthritis, right knee: Secondary | ICD-10-CM

## 2023-06-10 DIAGNOSIS — E66811 Obesity, class 1: Secondary | ICD-10-CM

## 2023-06-11 ENCOUNTER — Other Ambulatory Visit: Payer: Self-pay

## 2023-06-11 MED ORDER — TRAMADOL HCL 50 MG PO TABS
50.0000 mg | ORAL_TABLET | Freq: Three times a day (TID) | ORAL | 0 refills | Status: DC | PRN
  Filled 2023-06-11: qty 20, 7d supply, fill #0

## 2023-06-11 MED ORDER — PHENTERMINE HCL 30 MG PO CAPS
30.0000 mg | ORAL_CAPSULE | ORAL | 0 refills | Status: DC
Start: 2023-06-11 — End: 2023-07-06
  Filled 2023-06-11 – 2023-06-26 (×3): qty 30, 30d supply, fill #0

## 2023-06-11 NOTE — Telephone Encounter (Signed)
ERx 

## 2023-06-11 NOTE — Addendum Note (Signed)
Addended by: Eustaquio Boyden on: 06/11/2023 07:26 AM   Modules accepted: Orders

## 2023-06-11 NOTE — Telephone Encounter (Signed)
Phentermine refilled todayo.

## 2023-06-17 ENCOUNTER — Other Ambulatory Visit: Payer: Self-pay

## 2023-06-26 MED FILL — Methocarbamol Tab 500 MG: ORAL | 30 days supply | Qty: 60 | Fill #3 | Status: AC

## 2023-06-26 MED FILL — Diclofenac Sodium Tab Delayed Release 75 MG: ORAL | 30 days supply | Qty: 30 | Fill #3 | Status: AC

## 2023-06-27 ENCOUNTER — Other Ambulatory Visit: Payer: Self-pay

## 2023-06-28 ENCOUNTER — Other Ambulatory Visit: Payer: Self-pay

## 2023-07-06 ENCOUNTER — Encounter: Payer: Self-pay | Admitting: Plastic Surgery

## 2023-07-06 ENCOUNTER — Telehealth: Payer: Self-pay

## 2023-07-06 ENCOUNTER — Ambulatory Visit: Payer: Commercial Managed Care - PPO | Admitting: Plastic Surgery

## 2023-07-06 VITALS — BP 140/87 | HR 74 | Ht 68.0 in | Wt 201.2 lb

## 2023-07-06 DIAGNOSIS — M1711 Unilateral primary osteoarthritis, right knee: Secondary | ICD-10-CM

## 2023-07-06 DIAGNOSIS — F418 Other specified anxiety disorders: Secondary | ICD-10-CM

## 2023-07-06 DIAGNOSIS — E669 Obesity, unspecified: Secondary | ICD-10-CM

## 2023-07-06 DIAGNOSIS — R21 Rash and other nonspecific skin eruption: Secondary | ICD-10-CM

## 2023-07-06 DIAGNOSIS — M546 Pain in thoracic spine: Secondary | ICD-10-CM

## 2023-07-06 DIAGNOSIS — N62 Hypertrophy of breast: Secondary | ICD-10-CM

## 2023-07-06 DIAGNOSIS — M542 Cervicalgia: Secondary | ICD-10-CM

## 2023-07-06 DIAGNOSIS — G8929 Other chronic pain: Secondary | ICD-10-CM

## 2023-07-06 DIAGNOSIS — Z6830 Body mass index (BMI) 30.0-30.9, adult: Secondary | ICD-10-CM

## 2023-07-06 DIAGNOSIS — M545 Low back pain, unspecified: Secondary | ICD-10-CM

## 2023-07-06 DIAGNOSIS — E66811 Obesity, class 1: Secondary | ICD-10-CM

## 2023-07-06 NOTE — Addendum Note (Signed)
Addended by: Sherol Dade on: 07/06/2023 09:50 AM   Modules accepted: Orders

## 2023-07-06 NOTE — Telephone Encounter (Signed)
Faxed ROI form to [facility] to request PT records. Received fax success confirmation. Received requested records late afternoon. Original was sent to front desk for batch scanning and a copy was emailed to surgery schedulers for insurance purposes.

## 2023-07-06 NOTE — Progress Notes (Signed)
[name], female    DOB: [birth year], 57 y.o.   MRN: [id number]   Chief Complaint  Patient presents with   Consult   Breast Problem    Mammary Hyperplasia: The patient is a 57 y.o. female with a history of mammary hyperplasia for several years.  She has extremely large breasts causing symptoms that include the following: Back pain in the upper and lower back, including neck pain. She pulls or pins her bra straps to provide better lift and relief of the pressure and pain. She notices relief by holding her breast up manually.  Her shoulder straps cause grooves and pain and pressure that requires padding for relief. Pain medication is sometimes required with motrin and tylenol.  Activities that are hindered by enlarged breasts include: exercise and running.  She has tried supportive clothing as well as fitted bras without improvement.  Her breasts are extremely large and fairly symmetric.  She has hyperpigmentation of the inframammary area on both sides.  The sternal to nipple distance on the right is 38 cm and the left is 38 cm.  The IMF distance is 12 cm.  She is 5 feet 8 inches tall and weighs 200 pounds.  The BMI = 30.4 kg/m.  Preoperative bra size = G/H cup.  The estimated excess breast tissue to be removed at the time of surgery = 690 grams on the left and 690 grams on the right.  Mammogram history: June 2024 - negative.  Family history of breast cancer:  no.  Tobacco use:  no.   The patient expresses the desire to pursue surgical intervention.  The patient would like to have the surgery after she has her knee replacement done.  She had physical therapy within the past year without improvement in her back pain.     Review of Systems  Constitutional:  Positive for activity change. Negative for appetite change.  Eyes: Negative.   Respiratory: Negative.  Negative for chest tightness and shortness of breath.   Cardiovascular: Negative.   Gastrointestinal: Negative.    Endocrine: Negative.   Genitourinary: Negative.   Musculoskeletal:  Positive for back pain and neck pain.  Skin:  Positive for rash.    Past Medical History:  Diagnosis Date   Anxiety    Primary osteoarthritis of right knee 06/23/2018   R knee xray 05/2016 - Mild joint space narrowing in the medial compartment, mild subchondral sclerosis in the medial tibial plateau    Past Surgical History:  Procedure Laterality Date   NO PAST SURGERIES        Current Outpatient Medications:    buPROPion ER (WELLBUTRIN SR) 100 MG 12 hr tablet, Take 1 tablet (100 mg total) by mouth daily., Disp: 90 tablet, Rfl: 4   diclofenac (VOLTAREN) 75 MG EC tablet, Take 1 tablet (75 mg total) by mouth daily., Disp: 30 tablet, Rfl: 3   gabapentin (NEURONTIN) 100 MG capsule, Take 1 capsule (100 mg total) by mouth 2 (two) times daily., Disp: 180 capsule, Rfl: 3   hydrOXYzine (ATARAX) 25 MG tablet, Take 1 tablet (25 mg total) by mouth 2 (two) times daily as needed for anxiety., Disp: 30 tablet, Rfl: 1   methocarbamol (ROBAXIN) 500 MG tablet, Take 1 tablet (500 mg total) by mouth 2 (two) times daily., Disp: 60 tablet, Rfl: 3   ondansetron (ZOFRAN) 4 MG tablet, Take 1 tablet (4 mg total) by mouth every 8 (eight) hours as needed for nausea or vomiting., Disp:  20 tablet, Rfl: 0   phentermine 30 MG capsule, Take 1 capsule (30 mg total) by mouth every morning., Disp: 30 capsule, Rfl: 0   tirzepatide (ZEPBOUND) 2.5 MG/0.5ML Pen, Inject 2.5 mg into the skin once a week., Disp: 2 mL, Rfl: 0   tirzepatide (ZEPBOUND) 5 MG/0.5ML Pen, Inject 5 mg into the skin once a week., Disp: 2 mL, Rfl: 1   traMADol (ULTRAM) 50 MG tablet, Take 1 tablet (50 mg total) by mouth every 8 (eight) hours as needed for moderate pain (pain score 4-6) (sedation pecautions)., Disp: 20 tablet, Rfl: 0   Objective:   There were no vitals filed for this visit.  Physical Exam Vitals and nursing note reviewed.  Constitutional:      Appearance: Normal  appearance.  HENT:     Head: Normocephalic and atraumatic.  Cardiovascular:     Rate and Rhythm: Normal rate.     Pulses: Normal pulses.  Pulmonary:     Effort: Pulmonary effort is normal.  Abdominal:     Palpations: Abdomen is soft.  Musculoskeletal:        General: Tenderness present. No swelling or deformity.  Skin:    General: Skin is warm.     Capillary Refill: Capillary refill takes less than 2 seconds.     Coloration: Skin is not jaundiced.  Neurological:     Mental Status: She is alert and oriented to person, place, and time.  Psychiatric:        Mood and Affect: Mood normal.        Behavior: Behavior normal.        Thought Content: Thought content normal.        Judgment: Judgment normal.     Assessment & Plan:  Chronic midline thoracic back pain  Depression with anxiety  Primary osteoarthritis of right knee  Obesity (BMI 30.0-34.9)  Symptomatic mammary hypertrophy  The procedure the patient selected and that was best for the patient was discussed. The risk were discussed and include but not limited to the following:  Breast asymmetry, fluid accumulation, firmness of the breast, inability to breast feed, loss of nipple or areola, skin loss, change in skin and nipple sensation, fat necrosis of the breast tissue, bleeding, infection and healing delay.  There are risks of anesthesia and injury to nerves or blood vessels.  Allergic reaction to tape, suture and skin glue are possible.  There will be swelling.  Any of these can lead to the need for revisional surgery which is not included in this surgery.  A breast reduction has potential to interfere with diagnostic procedures in the future.  This procedure is best done when the breast is fully developed.  Changes in the breast will continue to occur over time: pregnancy, weight gain or weigh loss. No guarantees are given for a certain bra or breast size.    Total time: 40 minutes. This includes time spent with the patient  during the visit as well as time spent before and after the visit reviewing the chart, documenting the encounter, ordering pertinent studies and literature for the patient.   Physical therapy done Mammogram:  done  The patient is a candidate for bilateral breast reduction with liposuction.   Pictures were obtained of the patient and placed in the chart with the patient's or guardian's permission.   Alena Bills Deo Mehringer, DO

## 2023-07-07 ENCOUNTER — Other Ambulatory Visit: Payer: Self-pay

## 2023-07-18 ENCOUNTER — Other Ambulatory Visit: Payer: Self-pay | Admitting: Family Medicine

## 2023-07-18 DIAGNOSIS — M1711 Unilateral primary osteoarthritis, right knee: Secondary | ICD-10-CM

## 2023-07-19 ENCOUNTER — Other Ambulatory Visit: Payer: Self-pay

## 2023-07-20 ENCOUNTER — Other Ambulatory Visit: Payer: Self-pay

## 2023-07-20 MED ORDER — DICLOFENAC SODIUM 75 MG PO TBEC
75.0000 mg | DELAYED_RELEASE_TABLET | Freq: Every day | ORAL | 3 refills | Status: DC
  Filled 2023-07-20: qty 30, 30d supply, fill #0
  Filled 2023-08-04: qty 30, 30d supply, fill #1
  Filled 2023-09-28: qty 30, 30d supply, fill #2
  Filled 2023-10-10 – 2023-10-19 (×2): qty 30, 30d supply, fill #3

## 2023-07-20 MED ORDER — TRAMADOL HCL 50 MG PO TABS
50.0000 mg | ORAL_TABLET | Freq: Three times a day (TID) | ORAL | 0 refills | Status: DC | PRN
  Filled 2023-07-20: qty 20, 7d supply, fill #0

## 2023-07-20 NOTE — Telephone Encounter (Signed)
Last office visit 05/31/2023 for obesity, knee and back pain.  Last refilled Tramadol 06/11/2023 for #20 with no refills.  Diclofenac 03/08/2023 for #30 with 3 refills.  Next Appt: No future appointments.

## 2023-07-20 NOTE — Telephone Encounter (Signed)
ERx 

## 2023-07-21 ENCOUNTER — Other Ambulatory Visit: Payer: Self-pay

## 2023-08-04 ENCOUNTER — Encounter: Payer: Self-pay | Admitting: Family Medicine

## 2023-08-04 ENCOUNTER — Other Ambulatory Visit: Payer: Self-pay | Admitting: Family Medicine

## 2023-08-04 DIAGNOSIS — M1711 Unilateral primary osteoarthritis, right knee: Secondary | ICD-10-CM

## 2023-08-04 DIAGNOSIS — E66811 Obesity, class 1: Secondary | ICD-10-CM

## 2023-08-05 ENCOUNTER — Other Ambulatory Visit: Payer: Self-pay

## 2023-08-05 ENCOUNTER — Other Ambulatory Visit: Payer: Self-pay | Admitting: Family Medicine

## 2023-08-05 MED ORDER — SCOPOLAMINE 1 MG/3DAYS TD PT72
1.0000 | MEDICATED_PATCH | TRANSDERMAL | 0 refills | Status: DC
  Filled 2023-08-05: qty 10, 30d supply, fill #0

## 2023-08-05 MED ORDER — PHENTERMINE HCL 30 MG PO CAPS
30.0000 mg | ORAL_CAPSULE | ORAL | 0 refills | Status: DC
  Filled 2023-08-05: qty 30, 30d supply, fill #0

## 2023-08-05 MED FILL — Methocarbamol Tab 500 MG: ORAL | 30 days supply | Qty: 60 | Fill #0 | Status: AC

## 2023-08-05 NOTE — Telephone Encounter (Signed)
Robaxin Last filled:  06/28/23, #60 Last OV:  05/31/23, wt mgmt f/u Next OV:  none

## 2023-08-06 ENCOUNTER — Other Ambulatory Visit: Payer: Self-pay

## 2023-08-10 ENCOUNTER — Other Ambulatory Visit: Payer: Self-pay

## 2023-08-11 ENCOUNTER — Other Ambulatory Visit: Payer: Self-pay

## 2023-08-11 MED FILL — Methocarbamol Tab 500 MG: ORAL | 30 days supply | Qty: 60 | Fill #1 | Status: CN

## 2023-08-12 ENCOUNTER — Other Ambulatory Visit: Payer: Self-pay

## 2023-08-13 ENCOUNTER — Other Ambulatory Visit: Payer: Self-pay

## 2023-08-21 ENCOUNTER — Other Ambulatory Visit: Payer: Self-pay | Admitting: Family Medicine

## 2023-08-21 DIAGNOSIS — R110 Nausea: Secondary | ICD-10-CM

## 2023-08-21 DIAGNOSIS — M1711 Unilateral primary osteoarthritis, right knee: Secondary | ICD-10-CM

## 2023-08-22 ENCOUNTER — Other Ambulatory Visit: Payer: Self-pay

## 2023-08-22 MED ORDER — TRAMADOL HCL 50 MG PO TABS
50.0000 mg | ORAL_TABLET | Freq: Two times a day (BID) | ORAL | 0 refills | Status: DC | PRN
  Filled 2023-08-22: qty 30, 15d supply, fill #0

## 2023-08-22 MED ORDER — ONDANSETRON HCL 4 MG PO TABS
4.0000 mg | ORAL_TABLET | Freq: Three times a day (TID) | ORAL | 0 refills | Status: DC | PRN
  Filled 2023-08-22: qty 20, 7d supply, fill #0

## 2023-08-23 ENCOUNTER — Other Ambulatory Visit: Payer: Self-pay

## 2023-09-05 ENCOUNTER — Other Ambulatory Visit: Payer: Self-pay | Admitting: Family Medicine

## 2023-09-05 DIAGNOSIS — M1711 Unilateral primary osteoarthritis, right knee: Secondary | ICD-10-CM

## 2023-09-05 MED FILL — Methocarbamol Tab 500 MG: ORAL | 30 days supply | Qty: 60 | Fill #1 | Status: AC

## 2023-09-06 ENCOUNTER — Other Ambulatory Visit: Payer: Self-pay

## 2023-09-06 NOTE — Telephone Encounter (Signed)
 Name of Medication:  Tramadol  Name of Pharmacy:  [facility] Last Fill or Written Date and Quantity:  08/23/23, #30 Last Office Visit and Type:  05/31/23, wt mgmt f/u Next Office Visit and Type:  none Last Controlled Substance Agreement Date:  none Last UDS:  none

## 2023-09-07 ENCOUNTER — Other Ambulatory Visit: Payer: Self-pay

## 2023-09-07 MED ORDER — TRAMADOL HCL 50 MG PO TABS
50.0000 mg | ORAL_TABLET | Freq: Two times a day (BID) | ORAL | 0 refills | Status: DC | PRN
  Filled 2023-09-07: qty 30, 15d supply, fill #0

## 2023-09-07 NOTE — Telephone Encounter (Signed)
[place] CSRS reviewed  ?

## 2023-09-14 ENCOUNTER — Other Ambulatory Visit: Payer: Self-pay

## 2023-09-28 ENCOUNTER — Other Ambulatory Visit: Payer: Self-pay | Admitting: Family Medicine

## 2023-09-28 DIAGNOSIS — E66811 Obesity, class 1: Secondary | ICD-10-CM

## 2023-09-28 DIAGNOSIS — R110 Nausea: Secondary | ICD-10-CM

## 2023-09-29 ENCOUNTER — Other Ambulatory Visit: Payer: Self-pay

## 2023-09-29 NOTE — Telephone Encounter (Signed)
 Name of Medication:  Phentermine  Name of Pharmacy: [facility] Last Fill or Written Date and Quantity:  08/10/23, #30 Last Office Visit and Type:  05/31/23, wt mgmt f/u Next Office Visit and Type:  none Last Controlled Substance Agreement Date:  none Last UDS:  none  Zofran  last filled:  08/23/23, #20

## 2023-09-30 ENCOUNTER — Other Ambulatory Visit: Payer: Self-pay

## 2023-09-30 ENCOUNTER — Other Ambulatory Visit: Payer: Self-pay | Admitting: Family Medicine

## 2023-09-30 DIAGNOSIS — R110 Nausea: Secondary | ICD-10-CM

## 2023-09-30 MED FILL — Ondansetron HCl Tab 4 MG: ORAL | 7 days supply | Qty: 20 | Fill #0 | Status: AC

## 2023-09-30 NOTE — Telephone Encounter (Signed)
 Duplicate requests (see 09/28/23 refill note).

## 2023-09-30 NOTE — Telephone Encounter (Signed)
 Attempted to contact pt. No answer, no vm. Need to relay Dr Ocie Belt message and schedule OV.

## 2023-09-30 NOTE — Telephone Encounter (Signed)
 Zofran  refilled.  Please schedule weight management OV for phentermine  refill. Would ask pt to contact her insurance to see if this year they cover zepbound  ,wegovy or saxenda  injections.

## 2023-10-04 ENCOUNTER — Other Ambulatory Visit: Payer: Self-pay

## 2023-10-04 DIAGNOSIS — H5213 Myopia, bilateral: Secondary | ICD-10-CM | POA: Diagnosis not present

## 2023-10-04 DIAGNOSIS — H524 Presbyopia: Secondary | ICD-10-CM | POA: Diagnosis not present

## 2023-10-05 ENCOUNTER — Other Ambulatory Visit: Payer: Self-pay | Admitting: Family Medicine

## 2023-10-05 ENCOUNTER — Other Ambulatory Visit: Payer: Self-pay

## 2023-10-05 NOTE — Telephone Encounter (Signed)
Duplicate request (see 09/28/23 refill note).  Unpinned request, closing encounter.

## 2023-10-06 NOTE — Telephone Encounter (Signed)
Attempted to contact pt. No answer, no vm. Need to relay Dr Timoteo Expose message and schedule OV.  Sending pt a message via MyChart.

## 2023-10-10 MED FILL — Methocarbamol Tab 500 MG: ORAL | 30 days supply | Qty: 60 | Fill #2 | Status: AC

## 2023-10-11 ENCOUNTER — Other Ambulatory Visit: Payer: Self-pay

## 2023-10-11 NOTE — Telephone Encounter (Signed)
 Fyi to Dr. Reece Agar

## 2023-10-19 ENCOUNTER — Other Ambulatory Visit: Payer: Self-pay

## 2023-10-19 ENCOUNTER — Other Ambulatory Visit: Payer: Self-pay | Admitting: Family Medicine

## 2023-10-19 DIAGNOSIS — M1711 Unilateral primary osteoarthritis, right knee: Secondary | ICD-10-CM

## 2023-10-19 DIAGNOSIS — R110 Nausea: Secondary | ICD-10-CM

## 2023-10-19 DIAGNOSIS — E66811 Obesity, class 1: Secondary | ICD-10-CM

## 2023-10-19 NOTE — Telephone Encounter (Signed)
 Name of Medication:  Phentermine, Tramadol Name of Pharmacy: [facility] Last Fill or Written Date and Quantity:        Phentermine- 08/10/23, #30      Tramadol- 09/14/23, #30 Last Office Visit and Type:  05/31/23, wt mgmt f/u Next Office Visit and Type:  11/17/23, wt mgmt f/u Last Controlled Substance Agreement Date:  none Last UDS:  none   Zofran last filled:  10/06/23, #20

## 2023-10-20 ENCOUNTER — Other Ambulatory Visit: Payer: Self-pay | Admitting: Family Medicine

## 2023-10-20 ENCOUNTER — Other Ambulatory Visit: Payer: Self-pay

## 2023-10-20 DIAGNOSIS — M1711 Unilateral primary osteoarthritis, right knee: Secondary | ICD-10-CM

## 2023-10-20 DIAGNOSIS — R110 Nausea: Secondary | ICD-10-CM

## 2023-10-20 NOTE — Telephone Encounter (Signed)
 Duplicate request (see 10/19/23 refill note).

## 2023-10-21 ENCOUNTER — Other Ambulatory Visit: Payer: Self-pay

## 2023-10-22 ENCOUNTER — Other Ambulatory Visit: Payer: Self-pay

## 2023-10-25 ENCOUNTER — Other Ambulatory Visit: Payer: Self-pay

## 2023-10-26 ENCOUNTER — Other Ambulatory Visit: Payer: Self-pay

## 2023-10-26 MED FILL — Ondansetron HCl Tab 4 MG: ORAL | 7 days supply | Qty: 20 | Fill #0 | Status: AC

## 2023-10-26 MED FILL — Tramadol HCl Tab 50 MG: ORAL | 15 days supply | Qty: 30 | Fill #0 | Status: AC

## 2023-10-26 NOTE — Telephone Encounter (Signed)
 ERx Denied phentermine - will discuss at OV end of march

## 2023-11-14 ENCOUNTER — Other Ambulatory Visit: Payer: Self-pay | Admitting: Family Medicine

## 2023-11-14 DIAGNOSIS — M1711 Unilateral primary osteoarthritis, right knee: Secondary | ICD-10-CM

## 2023-11-14 MED FILL — Methocarbamol Tab 500 MG: ORAL | 30 days supply | Qty: 60 | Fill #3 | Status: AC

## 2023-11-15 ENCOUNTER — Other Ambulatory Visit: Payer: Self-pay

## 2023-11-16 ENCOUNTER — Other Ambulatory Visit: Payer: Self-pay | Admitting: Family Medicine

## 2023-11-16 ENCOUNTER — Other Ambulatory Visit: Payer: Self-pay

## 2023-11-16 DIAGNOSIS — M1711 Unilateral primary osteoarthritis, right knee: Secondary | ICD-10-CM

## 2023-11-16 NOTE — Telephone Encounter (Signed)
 Phentermine previously denied- per Dr Reece Agar will be discussed at 11/17/23 OV (see 10/20/23 refill note).  Rxs for ondansetron and tramadol sent 10/26/23 to [facility] (see 10/20/23 refill note).   Voltaren tab last filled:  10/22/23, #30 Gabapentin last filled:  07/19/23, #180 Last OV:  05/31/23, wt mgmt f/u Next OV:  11/17/23, wt mgmt f/u

## 2023-11-17 ENCOUNTER — Ambulatory Visit: Payer: Commercial Managed Care - PPO | Admitting: Family Medicine

## 2023-11-17 ENCOUNTER — Other Ambulatory Visit: Payer: Self-pay

## 2023-11-17 ENCOUNTER — Encounter: Payer: Self-pay | Admitting: Family Medicine

## 2023-11-17 VITALS — BP 124/84 | HR 72 | Temp 97.9°F | Ht 68.0 in | Wt 200.5 lb

## 2023-11-17 DIAGNOSIS — G8929 Other chronic pain: Secondary | ICD-10-CM | POA: Diagnosis not present

## 2023-11-17 DIAGNOSIS — M546 Pain in thoracic spine: Secondary | ICD-10-CM | POA: Diagnosis not present

## 2023-11-17 DIAGNOSIS — F418 Other specified anxiety disorders: Secondary | ICD-10-CM

## 2023-11-17 DIAGNOSIS — M1711 Unilateral primary osteoarthritis, right knee: Secondary | ICD-10-CM | POA: Diagnosis not present

## 2023-11-17 DIAGNOSIS — E66811 Obesity, class 1: Secondary | ICD-10-CM

## 2023-11-17 MED ORDER — BUPROPION HCL ER (SR) 100 MG PO TB12
100.0000 mg | ORAL_TABLET | Freq: Two times a day (BID) | ORAL | 3 refills | Status: DC
  Filled 2023-11-17 – 2024-01-04 (×2): qty 60, 30d supply, fill #0
  Filled 2024-02-17: qty 60, 30d supply, fill #1

## 2023-11-17 MED ORDER — NALTREXONE HCL 50 MG PO TABS
25.0000 mg | ORAL_TABLET | Freq: Every day | ORAL | 3 refills | Status: DC
  Filled 2023-11-17: qty 15, 30d supply, fill #0
  Filled 2023-12-22: qty 15, 30d supply, fill #1
  Filled 2024-02-17: qty 15, 30d supply, fill #2

## 2023-11-17 NOTE — Assessment & Plan Note (Deleted)
 Exacerbated by obesity.

## 2023-11-17 NOTE — Progress Notes (Signed)
 Ph: [phone] Fax: [phone]   [name], female    DOB: 30-Mar-1966, 58 y.o.   MRN: [id number]  This visit was conducted in person.  BP 124/84   Pulse 72   Temp 97.9 F (36.6 C) (Oral)   Ht 5\' 8"  (1.727 m)   Wt 200 lb 8 oz (90.9 kg)   SpO2 93%   BMI 30.49 kg/m    CC: weight management visit  Subjective:   HPI: Jasmine Willis is a 58 y.o. female presenting on 11/17/2023 for Medical Management of Chronic Issues (Here for wt mgmt f/u.)   Starting weight: 207 lbs Last weight: 200 lbs Today's weight 200 lbs  Phentermine 30mg  daily restarted 11/2022, filled 12/2022, 01/2023, 02/2023, 05/26/2023, 06/2023, 07/2023.   Intolerant to other meds tried - saxenda (nausea), Zepbound (nausea). She would be interested in retrial of this but it has been unaffordable.  Contrave was unaffordable.  Zepbound retrial was unaffordable ($980)  Desires to retry zepbound   Some daytime somnolence  Notes fatigue ESS = 1  Chronic pain due to R hip and back and knees.  Manages with gabapentin 100mg  twice daily and tramadol 2-3 times/wk. She takes hydroxyzine 25mg  BID PRN anxiety - rare use  Saw plastic surgeon for breast reduction surgery - however insurance denied this.  Not interested in weight loss surgery.   24 hour recall: Not done She's cut down on sugars, increasing meat and protein, no sweetened beverages.   Activity regimen: Activity limited by R knee osteoarthritis - s/p outpatient PT course completed 02/2023. Sees Dr Martha Clan  No regular exercise.      Relevant past medical, surgical, family and social history reviewed and updated as indicated. Interim medical history since our last visit reviewed. Allergies and medications reviewed and updated. Outpatient Medications Prior to Visit  Medication Sig Dispense Refill   diclofenac (VOLTAREN) 75 MG EC tablet Take 1 tablet (75 mg total) by mouth daily. 30 tablet 3   gabapentin (NEURONTIN) 100 MG capsule  Take 1 capsule (100 mg total) by mouth 2 (two) times daily. 180 capsule 3   hydrOXYzine (ATARAX) 25 MG tablet Take 1 tablet (25 mg total) by mouth 2 (two) times daily as needed for anxiety. 30 tablet 1   methocarbamol (ROBAXIN) 500 MG tablet Take 1 tablet (500 mg total) by mouth 2 (two) times daily. 60 tablet 3   ondansetron (ZOFRAN) 4 MG tablet Take 1 tablet (4 mg total) by mouth every 8 (eight) hours as needed for nausea or vomiting. 20 tablet 0   phentermine 30 MG capsule Take 1 capsule (30 mg total) by mouth every morning. 30 capsule 0   traMADol (ULTRAM) 50 MG tablet Take 1 tablet (50 mg total) by mouth 2 (two) times daily as needed for moderate pain (pain score 4-6) (sedation pecautions). 30 tablet 0   buPROPion ER (WELLBUTRIN SR) 100 MG 12 hr tablet Take 1 tablet (100 mg total) by mouth daily. 90 tablet 4   scopolamine (TRANSDERM-SCOP) 1 MG/3DAYS Place 1 patch (1.5 mg total) onto the skin every 3 (three) days. 10 patch 0   No facility-administered medications prior to visit.     Per HPI unless specifically indicated in ROS section below Review of Systems  Objective:  BP 124/84   Pulse 72   Temp 97.9 F (36.6 C) (Oral)   Ht 5\' 8"  (1.727 m)   Wt 200 lb 8 oz (90.9 kg)   SpO2 93%   BMI  30.49 kg/m   Wt Readings from Last 3 Encounters:  11/17/23 200 lb 8 oz (90.9 kg)  07/06/23 201 lb 3.2 oz (91.3 kg)  05/31/23 200 lb (90.7 kg)      Physical Exam Vitals and nursing note reviewed.  Constitutional:      Appearance: Normal appearance. She is not ill-appearing.  HENT:     Mouth/Throat:     Mouth: Mucous membranes are moist.     Pharynx: Oropharynx is clear. No oropharyngeal exudate or posterior oropharyngeal erythema.  Eyes:     Extraocular Movements: Extraocular movements intact.     Pupils: Pupils are equal, round, and reactive to light.  Neck:     Thyroid: No thyroid mass or thyromegaly.  Cardiovascular:     Rate and Rhythm: Normal rate and regular rhythm.     Pulses:  Normal pulses.     Heart sounds: Normal heart sounds. No murmur heard. Pulmonary:     Effort: Pulmonary effort is normal. No respiratory distress.     Breath sounds: Normal breath sounds. No wheezing, rhonchi or rales.  Musculoskeletal:     Cervical back: Normal range of motion and neck supple.     Right lower leg: No edema.     Left lower leg: No edema.  Skin:    General: Skin is warm and dry.     Findings: No rash.  Neurological:     Mental Status: She is alert.  Psychiatric:        Mood and Affect: Mood normal.        Behavior: Behavior normal.       Results for orders placed or performed in visit on 10/16/22  HM PAP SMEAR   Collection Time: 03/17/19 12:00 AM  Result Value Ref Range   HM Pap smear Negative for intraepithelial lesion or malignancy   Results Console HPV   Collection Time: 03/17/19 12:00 AM  Result Value Ref Range   CHL HPV Negative    Lab Results  Component Value Date   VD25OH 26.73 (L) 10/13/2022   Assessment & Plan:  Written Rx provided with future labs for upcoming CPE next month she will draw at work:  FLP, CMP, B12, vit D, iron panel with ferritin, CBC,TSH   Problem List Items Addressed This Visit     Obesity (BMI 30.0-34.9) - Primary   Rec against ongoing phentermine use (has been on for 5+ months).  Low ESS not suggestive of history of sleep apnea.  Zepbound unaffordable, previously had trouble tolerating.  She is already on Wellbutrin SR 100mg  once daily - will increase to BID dosing as well as start naltrexone 25mg  daily.  Discussed not taking tramadol if she takes naltrexone. Reassess at CPE next month.       Depression with anxiety   Chronic, stable period.  Try higher wellbutrin BID dose as per below.       Relevant Medications   buPROPion ER (WELLBUTRIN SR) 100 MG 12 hr tablet   Primary osteoarthritis of right knee   Exacerbated by obesity.       Chronic thoracic back pain   She was evaluated for breast reduction surgery and  was found to be good candidate for this. She would benefit from surgery to alleviate chronic thoracic back pain. She states insurance has denied her request for surgery - encouraged she request re-consideration by insurance.  Discussed trying higher gabapentin dose as needed/tolerated      Relevant Medications   buPROPion ER [facility] SR)  100 MG 12 hr tablet     Meds ordered this encounter  Medications   buPROPion ER (WELLBUTRIN SR) 100 MG 12 hr tablet    Sig: Take 1 tablet (100 mg total) by mouth 2 (two) times daily.    Dispense:  60 tablet    Refill:  3   naltrexone (DEPADE) 50 MG tablet    Sig: Take 0.5 tablets (25 mg total) by mouth daily.    Dispense:  15 tablet    Refill:  3    No orders of the defined types were placed in this encounter.   Patient Instructions  Try increasing gabapentin to 100mg  in am and 200mg  at night for nerve/back pain. Try increasing wellbutrin SR to 100mg  twice daily. Also start naltrexone 50mg  1/2 tablet daily Both of these medicines are the components of prescription Contrave weight loss medicine.  Don't take tramadol with naltrexone (reverses the effect).  Keep physical appointment - get fasting labs done 1 week prior.   Follow up plan: Return if symptoms worsen or fail to improve.  Eustaquio Boyden, MD

## 2023-11-17 NOTE — Assessment & Plan Note (Signed)
 Exacerbated by obesity.

## 2023-11-17 NOTE — Assessment & Plan Note (Addendum)
 She was evaluated for breast reduction surgery and was found to be good candidate for this. She would benefit from surgery to alleviate chronic thoracic back pain. She states insurance has denied her request for surgery - encouraged she request re-consideration by insurance.  Discussed trying higher gabapentin dose as needed/tolerated

## 2023-11-17 NOTE — Assessment & Plan Note (Addendum)
 Rec against ongoing phentermine use (has been on for 5+ months).  Low ESS not suggestive of history of sleep apnea.  Zepbound unaffordable, previously had trouble tolerating.  She is already on Wellbutrin SR 100mg  once daily - will increase to BID dosing as well as start naltrexone 25mg  daily.  Discussed not taking tramadol if she takes naltrexone. Reassess at CPE next month.

## 2023-11-17 NOTE — Patient Instructions (Addendum)
 Try increasing gabapentin to 100mg  in am and 200mg  at night for nerve/back pain. Try increasing wellbutrin SR to 100mg  twice daily. Also start naltrexone 50mg  1/2 tablet daily Both of these medicines are the components of prescription Contrave weight loss medicine.  Don't take tramadol with naltrexone (reverses the effect).  Keep physical appointment - get fasting labs done 1 week prior.

## 2023-11-17 NOTE — Assessment & Plan Note (Signed)
 Chronic, stable period.  Try higher wellbutrin BID dose as per below.

## 2023-11-18 ENCOUNTER — Other Ambulatory Visit: Payer: Self-pay

## 2023-11-19 ENCOUNTER — Other Ambulatory Visit: Payer: Self-pay | Admitting: Family Medicine

## 2023-11-19 ENCOUNTER — Other Ambulatory Visit: Payer: Self-pay

## 2023-11-19 DIAGNOSIS — G8929 Other chronic pain: Secondary | ICD-10-CM

## 2023-11-19 DIAGNOSIS — R112 Nausea with vomiting, unspecified: Secondary | ICD-10-CM

## 2023-11-19 DIAGNOSIS — F418 Other specified anxiety disorders: Secondary | ICD-10-CM

## 2023-11-19 MED FILL — Gabapentin Cap 100 MG: ORAL | 90 days supply | Qty: 180 | Fill #0 | Status: AC

## 2023-11-19 MED FILL — Diclofenac Sodium Tab Delayed Release 75 MG: ORAL | 30 days supply | Qty: 30 | Fill #0 | Status: AC

## 2023-11-19 NOTE — Telephone Encounter (Signed)
 Copied from CRM [id number]. Topic: Clinical - Prescription Issue >> Nov 19, 2023 12:05 PM Tiffany S wrote: Reason for CRM: Patient called stating the prescription below does not have future refills   diclofenac (VOLTAREN) 75 MG EC tablet [[id number]] gabapentin (NEURONTIN) 100 MG capsule [[id number]] hydrOXYzine (ATARAX) 25 MG tablet [[id number]] methocarbamol (ROBAXIN) 500 MG tablet [[id number]] ondansetron (ZOFRAN) 4 MG tablet [[id number]]  Please follow up with patient

## 2023-11-19 NOTE — Telephone Encounter (Signed)
 ERx

## 2023-11-22 ENCOUNTER — Other Ambulatory Visit: Payer: Self-pay

## 2023-11-22 NOTE — Telephone Encounter (Signed)
 Voltaren rx sent 11/19/23, #30/3 refills to [facility]. Gabapentin rx sent 11/19/23, #180/0 refills to [facility]. Duplicate Robaxin request (see 11/17/23 pt msg).  Hydroxyzine last rx:  10/26/23, #20 Zofran last rx:  10/26/23, #20 Last OV:  11/17/23, wt mgmt f/u Next OV:  12/21/23, CPE

## 2023-11-22 NOTE — Telephone Encounter (Signed)
 Robaxin Last rx:  08/05/23, #60 Last OV:  11/17/23, wt mgmt f/u Next OV:  12/21/23, CPE

## 2023-11-24 ENCOUNTER — Other Ambulatory Visit: Payer: Self-pay

## 2023-11-24 MED ORDER — METHOCARBAMOL 500 MG PO TABS
500.0000 mg | ORAL_TABLET | Freq: Three times a day (TID) | ORAL | Status: DC | PRN
Start: 2023-11-24 — End: 2023-12-21

## 2023-11-24 MED ORDER — METHOCARBAMOL 500 MG PO TABS
500.0000 mg | ORAL_TABLET | Freq: Two times a day (BID) | ORAL | 3 refills | Status: DC
  Filled 2023-11-24 – 2023-12-22 (×2): qty 60, 30d supply, fill #0
  Filled 2024-01-21: qty 60, 30d supply, fill #1
  Filled 2024-02-20: qty 60, 30d supply, fill #2
  Filled 2024-03-21: qty 60, 30d supply, fill #3

## 2023-11-24 MED ORDER — ONDANSETRON HCL 4 MG PO TABS
4.0000 mg | ORAL_TABLET | Freq: Three times a day (TID) | ORAL | 0 refills | Status: AC | PRN
  Filled 2023-11-24: qty 20, 7d supply, fill #0

## 2023-11-24 MED ORDER — HYDROXYZINE PAMOATE 25 MG PO CAPS
25.0000 mg | ORAL_CAPSULE | Freq: Three times a day (TID) | ORAL | 0 refills | Status: DC | PRN
  Filled 2023-11-24: qty 30, 10d supply, fill #0

## 2023-11-24 MED ORDER — GABAPENTIN 100 MG PO CAPS
100.0000 mg | ORAL_CAPSULE | Freq: Three times a day (TID) | ORAL | 3 refills | Status: DC
  Filled 2023-11-24 – 2024-01-21 (×3): qty 90, 30d supply, fill #0
  Filled 2024-02-17: qty 90, 30d supply, fill #1
  Filled 2024-03-21: qty 90, 30d supply, fill #2
  Filled 2024-04-29: qty 90, 30d supply, fill #3

## 2023-11-24 MED ORDER — DICLOFENAC SODIUM 75 MG PO TBEC
75.0000 mg | DELAYED_RELEASE_TABLET | Freq: Two times a day (BID) | ORAL | 0 refills | Status: DC
  Filled 2023-11-24: qty 30, 15d supply, fill #0

## 2023-11-24 NOTE — Telephone Encounter (Signed)
 ERx

## 2023-11-25 ENCOUNTER — Other Ambulatory Visit (HOSPITAL_COMMUNITY): Payer: Self-pay

## 2023-12-14 DIAGNOSIS — E559 Vitamin D deficiency, unspecified: Secondary | ICD-10-CM | POA: Diagnosis not present

## 2023-12-14 DIAGNOSIS — R7989 Other specified abnormal findings of blood chemistry: Secondary | ICD-10-CM | POA: Diagnosis not present

## 2023-12-14 DIAGNOSIS — D509 Iron deficiency anemia, unspecified: Secondary | ICD-10-CM | POA: Diagnosis not present

## 2023-12-14 DIAGNOSIS — E66811 Obesity, class 1: Secondary | ICD-10-CM | POA: Diagnosis not present

## 2023-12-14 LAB — IRON,TIBC AND FERRITIN PANEL
Ferritin: 128
Iron: 79

## 2023-12-14 LAB — VITAMIN B12: Vitamin B-12: 687

## 2023-12-14 LAB — LIPID PANEL
Cholesterol: 196 (ref 0–200)
HDL: 46 (ref 35–70)
LDL Cholesterol: 137
Triglycerides: 71 (ref 40–160)

## 2023-12-14 LAB — HEPATIC FUNCTION PANEL
ALT: 19 U/L (ref 7–35)
AST: 18 (ref 13–35)
Alkaline Phosphatase: 98 (ref 25–125)
Bilirubin, Total: 0.6

## 2023-12-14 LAB — BASIC METABOLIC PANEL WITH GFR
Creatinine: 1 (ref 0.5–1.1)
Glucose: 85
Potassium: 4.9 meq/L (ref 3.5–5.1)
Sodium: 141 (ref 137–147)

## 2023-12-14 LAB — COMPREHENSIVE METABOLIC PANEL WITH GFR: Calcium: 9.7 (ref 8.7–10.7)

## 2023-12-14 LAB — LAB REPORT - SCANNED: EGFR: 65

## 2023-12-14 LAB — CBC AND DIFFERENTIAL
Hemoglobin: 13.8 (ref 12.0–16.0)
WBC: 6.7

## 2023-12-14 LAB — CBC: RBC: 4.84 (ref 3.87–5.11)

## 2023-12-14 LAB — TSH: TSH: 2.63 (ref 0.41–5.90)

## 2023-12-14 LAB — VITAMIN D 25 HYDROXY (VIT D DEFICIENCY, FRACTURES): Vit D, 25-Hydroxy: 49.6

## 2023-12-17 ENCOUNTER — Other Ambulatory Visit: Payer: Self-pay

## 2023-12-21 ENCOUNTER — Other Ambulatory Visit: Payer: Self-pay

## 2023-12-21 ENCOUNTER — Encounter: Payer: Self-pay | Admitting: Family Medicine

## 2023-12-21 ENCOUNTER — Ambulatory Visit (INDEPENDENT_AMBULATORY_CARE_PROVIDER_SITE_OTHER): Payer: Commercial Managed Care - PPO | Admitting: Family Medicine

## 2023-12-21 VITALS — BP 124/84 | HR 74 | Temp 98.2°F | Ht 65.75 in | Wt 195.4 lb

## 2023-12-21 DIAGNOSIS — F418 Other specified anxiety disorders: Secondary | ICD-10-CM | POA: Diagnosis not present

## 2023-12-21 DIAGNOSIS — E66811 Obesity, class 1: Secondary | ICD-10-CM

## 2023-12-21 DIAGNOSIS — E559 Vitamin D deficiency, unspecified: Secondary | ICD-10-CM

## 2023-12-21 DIAGNOSIS — M1711 Unilateral primary osteoarthritis, right knee: Secondary | ICD-10-CM | POA: Diagnosis not present

## 2023-12-21 DIAGNOSIS — Z0000 Encounter for general adult medical examination without abnormal findings: Secondary | ICD-10-CM

## 2023-12-21 DIAGNOSIS — D509 Iron deficiency anemia, unspecified: Secondary | ICD-10-CM | POA: Diagnosis not present

## 2023-12-21 DIAGNOSIS — R7989 Other specified abnormal findings of blood chemistry: Secondary | ICD-10-CM | POA: Diagnosis not present

## 2023-12-21 DIAGNOSIS — Z1211 Encounter for screening for malignant neoplasm of colon: Secondary | ICD-10-CM | POA: Diagnosis not present

## 2023-12-21 MED ORDER — DICLOFENAC SODIUM 75 MG PO TBEC
75.0000 mg | DELAYED_RELEASE_TABLET | Freq: Two times a day (BID) | ORAL | 3 refills | Status: DC
  Filled 2023-12-21: qty 30, 15d supply, fill #0
  Filled 2023-12-22 – 2024-01-04 (×2): qty 30, 15d supply, fill #1
  Filled 2024-01-21: qty 30, 15d supply, fill #2
  Filled 2024-02-17: qty 30, 15d supply, fill #3

## 2023-12-21 MED ORDER — HYDROXYZINE HCL 25 MG PO TABS
25.0000 mg | ORAL_TABLET | Freq: Two times a day (BID) | ORAL | 3 refills | Status: AC | PRN
  Filled 2023-12-21: qty 30, 15d supply, fill #0

## 2023-12-21 NOTE — Progress Notes (Signed)
 Ph: [phone] Fax: [phone]   [name], female    DOB: 12-07-1965, 58 y.o.   MRN: [id number]  This visit was conducted in person.  BP 124/84   Pulse 74   Temp 98.2 F (36.8 C) (Oral)   Ht 5' 5.75" (1.67 m)   Wt 195 lb 6 oz (88.6 kg)   SpO2 98%   BMI 31.77 kg/m    CC: CPE Subjective:   HPI: Jasmine Willis is a 58 y.o. female presenting on 12/21/2023 for Annual Exam   She is finishing some leftover semaglutide injections she had.   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  Mammogram 01/2023 Lorel Willis at [facility]  LMP - 3+ years ago - none after ablation Lung cancer screening - non smoker  Flu shot at work COVID vaccine [employer] 08/2019, 09/2019, booster 05/2020 Tetanus - unsure  Shingrix  - 04/2020, 07/2021  Seat belt use discussed Sunscreen use discussed, no changing moles on skin.  Sleep - averaging 8 hours/night Non smoker Alcohol - glass of wine 3/wk Dentist q6 mo  Eye exam yearly    Lives with husband and 2 children, 1 dog Edu: some college Occupation: works at [employer] Activity: walking 2 miles 3x/wk  Diet: good water, fruits/vegetables daily     Relevant past medical, surgical, family and social history reviewed and updated as indicated. Interim medical history since our last visit reviewed. Allergies and medications reviewed and updated. Outpatient Medications Prior to Visit  Medication Sig Dispense Refill   buPROPion  ER (WELLBUTRIN  SR) 100 MG 12 hr tablet Take 1 tablet (100 mg total) by mouth 2 (two) times daily. 60 tablet 3   gabapentin  (NEURONTIN ) 100 MG capsule Take 1 capsule (100 mg total) by mouth 3 (three) times daily. 90 capsule 3   methocarbamol  (ROBAXIN ) 500 MG tablet Take 1 tablet (500 mg total) by mouth 2 (two) times daily. 60 tablet 3   naltrexone   (DEPADE) 50 MG tablet Take 0.5 tablets (25 mg total) by mouth daily. 15 tablet 3   ondansetron  (ZOFRAN ) 4 MG tablet Take 1 tablet (4 mg total) by mouth every 8 (eight) hours as needed for nausea or vomiting. 20 tablet 0   traMADol  (ULTRAM ) 50 MG tablet Take 1 tablet (50 mg total) by mouth 2 (two) times daily as needed for moderate pain (pain score 4-6) (sedation pecautions). 30 tablet 0   diclofenac  (VOLTAREN ) 75 MG EC tablet Take 1 tablet (75 mg total) by mouth 2 (two) times daily. 30 tablet 0   hydrOXYzine  (ATARAX ) 25 MG tablet Take 1 tablet (25 mg total) by mouth 2 (two) times daily as needed for anxiety. 30 tablet 1   hydrOXYzine  (VISTARIL ) 25 MG capsule Take 1 capsule (25 mg total) by mouth every 8 (eight) hours as needed. 30 capsule 0   diclofenac  (VOLTAREN ) 75 MG EC tablet Take 1 tablet (75 mg total) by mouth daily. 30 tablet 3   gabapentin  (NEURONTIN ) 100 MG capsule Take 1 capsule (100 mg total) by mouth 2 (two) times daily. 180 capsule 1   phentermine  30 MG capsule Take 1 capsule (30 mg total) by mouth every morning. 30 capsule 0   methocarbamol  (ROBAXIN ) tablet 500 mg      No facility-administered medications prior to visit.     Per HPI unless specifically indicated  in ROS section below Review of Systems  Constitutional:  Negative for activity change, appetite change, chills, fatigue, fever and unexpected weight change.  HENT:  Negative for hearing loss.   Eyes:  Negative for visual disturbance.  Respiratory:  Negative for cough, chest tightness, shortness of breath and wheezing.   Cardiovascular:  Negative for chest pain, palpitations and leg swelling.  Gastrointestinal:  Negative for abdominal distention, abdominal pain, blood in stool, constipation, diarrhea, nausea and vomiting.  Genitourinary:  Negative for difficulty urinating and hematuria.  Musculoskeletal:  Negative for arthralgias, myalgias and neck pain.  Skin:  Negative for rash.  Neurological:  Negative for dizziness,  seizures, syncope and headaches.  Hematological:  Negative for adenopathy. Does not bruise/bleed easily.  Psychiatric/Behavioral:  Negative for dysphoric mood. The patient is nervous/anxious.     Objective:  BP 124/84   Pulse 74   Temp 98.2 F (36.8 C) (Oral)   Ht 5' 5.75" (1.67 m)   Wt 195 lb 6 oz (88.6 kg)   SpO2 98%   BMI 31.77 kg/m   Wt Readings from Last 3 Encounters:  12/21/23 195 lb 6 oz (88.6 kg)  11/17/23 200 lb 8 oz (90.9 kg)  07/06/23 201 lb 3.2 oz (91.3 kg)      Physical Exam Vitals and nursing note reviewed.  Constitutional:      Appearance: Normal appearance. She is not ill-appearing.  HENT:     Head: Normocephalic and atraumatic.     Right Ear: Tympanic membrane, ear canal and external ear normal. There is no impacted cerumen.     Left Ear: Tympanic membrane, ear canal and external ear normal. There is no impacted cerumen.     Mouth/Throat:     Mouth: Mucous membranes are moist.     Pharynx: Oropharynx is clear. No oropharyngeal exudate or posterior oropharyngeal erythema.  Eyes:     General:        Right eye: No discharge.        Left eye: No discharge.     Extraocular Movements: Extraocular movements intact.     Conjunctiva/sclera: Conjunctivae normal.     Pupils: Pupils are equal, round, and reactive to light.  Neck:     Thyroid : No thyroid  mass or thyromegaly.  Cardiovascular:     Rate and Rhythm: Normal rate and regular rhythm.     Pulses: Normal pulses.     Heart sounds: Normal heart sounds. No murmur heard. Pulmonary:     Effort: Pulmonary effort is normal. No respiratory distress.     Breath sounds: Normal breath sounds. No wheezing, rhonchi or rales.  Abdominal:     General: Bowel sounds are normal. There is no distension.     Palpations: Abdomen is soft. There is no mass.     Tenderness: There is no abdominal tenderness. There is no guarding or rebound.     Hernia: No hernia is present.  Musculoskeletal:     Cervical back: Normal range of  motion and neck supple. No rigidity.     Right lower leg: No edema.     Left lower leg: No edema.  Lymphadenopathy:     Cervical: No cervical adenopathy.  Skin:    General: Skin is warm and dry.     Findings: No rash.  Neurological:     General: No focal deficit present.     Mental Status: She is alert. Mental status is at baseline.  Psychiatric:        Mood and Affect:  Mood normal.        Behavior: Behavior normal.       Results for orders placed or performed in visit on 12/15/23  Lab report - scanned   Collection Time: 12/14/23  7:31 AM  Result Value Ref Range   EGFR 65.0    Lab Results  Component Value Date   VITAMINB12 618 10/13/2022    Assessment & Plan:   Problem List Items Addressed This Visit     Health maintenance examination - Primary (Chronic)   Preventative protocols reviewed and updated unless pt declined. Discussed healthy diet and lifestyle.       Obesity (BMI 30.0-34.9)   Continue to encourage healthy diet and lifestyle choices to affect sustainable weight loss.       Depression with anxiety   Chronic, stable period on bupropion  SR 100mg  BID - continue this.       Relevant Medications   hydrOXYzine  (ATARAX ) 25 MG tablet   Primary osteoarthritis of right knee   Continues voltaren  tablet one a day.  Discussed caution with NSAIDs and kidney function - she will use sparingly, start with tylenol .       Relevant Medications   diclofenac  (VOLTAREN ) 75 MG EC tablet   RESOLVED: IDA (iron deficiency anemia)   Anemia has resolved, iron levels are now back to normal.       Low vitamin B12 level   Will need updated next labwork. Not currently on replacement.       Vitamin D  deficiency   Levels stable off replacement.       Other Visit Diagnoses       Special screening for malignant neoplasms, colon       Relevant Orders   Ambulatory referral to Gastroenterology        Meds ordered this encounter  Medications   diclofenac  (VOLTAREN ) 75 MG  EC tablet    Sig: Take 1 tablet (75 mg total) by mouth 2 (two) times daily.    Dispense:  30 tablet    Refill:  3   hydrOXYzine  (ATARAX ) 25 MG tablet    Sig: Take 1 tablet (25 mg total) by mouth 2 (two) times daily as needed for anxiety.    Dispense:  30 tablet    Refill:  3    Orders Placed This Encounter  Procedures   Ambulatory referral to Gastroenterology    Referral Priority:   Routine    Referral Type:   Consultation    Referral Reason:   Specialty Services Required    Number of Visits Requested:   1    Patient Instructions  Good to see you today.  We will refer you for colonoscopy.  Hydroxyzine  and voltaren  tablets refilled. Use voltaren  sparingly as able Return as needed or in 3 months for weight follow up visit.   Follow up plan: Return in about 3 months (around 03/21/2024) for follow up visit.  Claire Crick, MD

## 2023-12-21 NOTE — Assessment & Plan Note (Addendum)
 Anemia has resolved, iron levels are now back to normal.

## 2023-12-21 NOTE — Assessment & Plan Note (Signed)
 Preventative protocols reviewed and updated unless pt declined. Discussed healthy diet and lifestyle.

## 2023-12-21 NOTE — Assessment & Plan Note (Addendum)
Levels stable off replacement.

## 2023-12-21 NOTE — Assessment & Plan Note (Signed)
 Continue to encourage healthy diet and lifestyle choices to affect sustainable weight loss.

## 2023-12-21 NOTE — Assessment & Plan Note (Signed)
 Will need updated next labwork. Not currently on replacement.

## 2023-12-21 NOTE — Assessment & Plan Note (Signed)
 Chronic, stable period on bupropion  SR 100mg  BID - continue this.

## 2023-12-21 NOTE — Assessment & Plan Note (Signed)
 Continues voltaren  tablet one a day.  Discussed caution with NSAIDs and kidney function - she will use sparingly, start with tylenol .

## 2023-12-21 NOTE — Patient Instructions (Addendum)
 Good to see you today.  We will refer you for colonoscopy.  Hydroxyzine  and voltaren  tablets refilled. Use voltaren  sparingly as able Return as needed or in 3 months for weight follow up visit.

## 2023-12-22 ENCOUNTER — Encounter: Payer: Self-pay | Admitting: Family Medicine

## 2023-12-23 ENCOUNTER — Other Ambulatory Visit: Payer: Self-pay

## 2023-12-24 ENCOUNTER — Ambulatory Visit: Payer: Self-pay

## 2023-12-25 ENCOUNTER — Other Ambulatory Visit: Payer: Self-pay | Admitting: Family Medicine

## 2023-12-25 DIAGNOSIS — M1711 Unilateral primary osteoarthritis, right knee: Secondary | ICD-10-CM

## 2023-12-27 ENCOUNTER — Other Ambulatory Visit: Payer: Self-pay | Admitting: Family Medicine

## 2023-12-27 ENCOUNTER — Other Ambulatory Visit: Payer: Self-pay

## 2023-12-27 DIAGNOSIS — M1711 Unilateral primary osteoarthritis, right knee: Secondary | ICD-10-CM

## 2023-12-27 NOTE — Telephone Encounter (Signed)
 Name of Medication:  Tramadol  Name of Pharmacy:  [facility] Last Fill or Written Date and Quantity:  10/26/23, #30 Last Office Visit and Type:  12/21/23, CPE Next Office Visit and Type:  03/21/24, 3 mo wt mgmt f/u Last Controlled Substance Agreement Date:  none Last UDS:  none

## 2023-12-28 ENCOUNTER — Other Ambulatory Visit: Payer: Self-pay

## 2023-12-29 ENCOUNTER — Other Ambulatory Visit: Payer: Self-pay

## 2023-12-29 MED FILL — Tramadol HCl Tab 50 MG: ORAL | 15 days supply | Qty: 30 | Fill #0 | Status: AC

## 2023-12-29 NOTE — Telephone Encounter (Signed)
 ERx Plz notify - should not take tramadol  with naltrexone  as naltrexone  counteracts tramadol  effect

## 2023-12-29 NOTE — Telephone Encounter (Signed)
 Spoke with pt relaying Dr Ocie Belt message. Pt verbalizes understanding and states she remembers Dr Crissie Dome telling her that previously but expresses her thanks for the call.

## 2024-01-05 ENCOUNTER — Other Ambulatory Visit: Payer: Self-pay

## 2024-01-21 ENCOUNTER — Other Ambulatory Visit: Payer: Self-pay

## 2024-02-17 ENCOUNTER — Other Ambulatory Visit: Payer: Self-pay | Admitting: Family Medicine

## 2024-02-17 ENCOUNTER — Other Ambulatory Visit: Payer: Self-pay

## 2024-02-17 DIAGNOSIS — M1711 Unilateral primary osteoarthritis, right knee: Secondary | ICD-10-CM

## 2024-02-17 NOTE — Telephone Encounter (Signed)
 Duplicate request (see other 02/17/24 refill note).

## 2024-02-17 NOTE — Telephone Encounter (Signed)
 Name of Medication:  Tramadol  Name of Pharmacy:  [facility] Last Fill or Written Date and Quantity:  12/29/23, #30 Last Office Visit and Type:  12/21/23, CPE Next Office Visit and Type:  03/21/24, 3 mo wt mgmt f/u Last Controlled Substance Agreement Date:  none Last UDS:  none

## 2024-02-18 ENCOUNTER — Other Ambulatory Visit: Payer: Self-pay

## 2024-02-21 ENCOUNTER — Other Ambulatory Visit: Payer: Self-pay

## 2024-02-21 MED ORDER — TRAMADOL HCL 50 MG PO TABS
50.0000 mg | ORAL_TABLET | Freq: Two times a day (BID) | ORAL | 0 refills | Status: DC | PRN
  Filled 2024-02-21: qty 30, 15d supply, fill #0

## 2024-02-21 NOTE — Telephone Encounter (Signed)
 ERx

## 2024-02-21 NOTE — Addendum Note (Signed)
 Addended by: RILLA BALLER on: 02/21/2024 07:05 AM   Modules accepted: Orders

## 2024-03-01 ENCOUNTER — Other Ambulatory Visit: Payer: Self-pay | Admitting: Family Medicine

## 2024-03-01 DIAGNOSIS — M1711 Unilateral primary osteoarthritis, right knee: Secondary | ICD-10-CM

## 2024-03-02 ENCOUNTER — Other Ambulatory Visit: Payer: Self-pay | Admitting: Family Medicine

## 2024-03-02 ENCOUNTER — Other Ambulatory Visit: Payer: Self-pay

## 2024-03-02 DIAGNOSIS — M1711 Unilateral primary osteoarthritis, right knee: Secondary | ICD-10-CM

## 2024-03-02 MED FILL — Tramadol HCl Tab 50 MG: ORAL | 15 days supply | Qty: 30 | Fill #0 | Status: CN

## 2024-03-02 NOTE — Telephone Encounter (Signed)
 ERx

## 2024-03-02 NOTE — Telephone Encounter (Signed)
 Duplicate request. Rx sent 02/21/24, #30/0 refills to [facility].

## 2024-03-06 ENCOUNTER — Other Ambulatory Visit: Payer: Self-pay

## 2024-03-06 MED FILL — Tramadol HCl Tab 50 MG: ORAL | 15 days supply | Qty: 30 | Fill #0 | Status: AC

## 2024-03-21 ENCOUNTER — Other Ambulatory Visit: Payer: Self-pay

## 2024-03-21 ENCOUNTER — Encounter: Payer: Self-pay | Admitting: Family Medicine

## 2024-03-21 ENCOUNTER — Ambulatory Visit: Admitting: Family Medicine

## 2024-03-21 ENCOUNTER — Ambulatory Visit
Admission: RE | Admit: 2024-03-21 | Discharge: 2024-03-21 | Disposition: A | Discharge status: Home / Self Care | Source: Ambulatory Visit | Attending: Family Medicine | Admitting: Family Medicine

## 2024-03-21 VITALS — BP 132/78 | HR 70 | Temp 98.2°F | Ht 65.75 in | Wt 200.5 lb

## 2024-03-21 DIAGNOSIS — E66811 Obesity, class 1: Secondary | ICD-10-CM

## 2024-03-21 DIAGNOSIS — M1711 Unilateral primary osteoarthritis, right knee: Secondary | ICD-10-CM

## 2024-03-21 DIAGNOSIS — F418 Other specified anxiety disorders: Secondary | ICD-10-CM

## 2024-03-21 DIAGNOSIS — G8929 Other chronic pain: Secondary | ICD-10-CM

## 2024-03-21 DIAGNOSIS — M533 Sacrococcygeal disorders, not elsewhere classified: Secondary | ICD-10-CM | POA: Diagnosis not present

## 2024-03-21 DIAGNOSIS — M545 Low back pain, unspecified: Secondary | ICD-10-CM

## 2024-03-21 DIAGNOSIS — M461 Sacroiliitis, not elsewhere classified: Secondary | ICD-10-CM | POA: Diagnosis not present

## 2024-03-21 DIAGNOSIS — M25561 Pain in right knee: Secondary | ICD-10-CM

## 2024-03-21 LAB — C-REACTIVE PROTEIN: CRP: <1 mg/dL (ref 0.5–20.0)

## 2024-03-21 LAB — SEDIMENTATION RATE: Sed Rate: 5 mm/h (ref 0–30)

## 2024-03-21 MED ORDER — BUPROPION HCL ER (SR) 100 MG PO TB12
100.0000 mg | ORAL_TABLET | Freq: Two times a day (BID) | ORAL | 6 refills | Status: DC
  Filled 2024-03-21: qty 60, 30d supply, fill #0
  Filled 2024-04-29: qty 60, 30d supply, fill #1
  Filled 2024-05-30: qty 60, 30d supply, fill #2
  Filled 2024-06-28: qty 60, 30d supply, fill #3
  Filled 2024-07-24: qty 60, 30d supply, fill #4

## 2024-03-21 MED ORDER — PHENTERMINE HCL 30 MG PO CAPS
30.0000 mg | ORAL_CAPSULE | ORAL | 3 refills | Status: DC
  Filled 2024-03-21: qty 30, 30d supply, fill #0
  Filled 2024-04-11 – 2024-04-18 (×2): qty 30, 30d supply, fill #1
  Filled 2024-05-15: qty 30, 30d supply, fill #2
  Filled 2024-06-28: qty 30, 30d supply, fill #3

## 2024-03-21 NOTE — Assessment & Plan Note (Addendum)
 Activity limited by arthralgias.  Reviewed 24 hour food diary, recommendations provided. Continues wellbutrin  SR once daily - will increase to BID dosing.  Not taking naltrexone  as she's been taking more tramadol  as per below. Will restart Phentermine  30mg  daily - reviewing stimulant effect, cardiovascular risk. She is deemed overall at low cardiovascular risk.  RTC 2-3 months weight management visit.

## 2024-03-21 NOTE — Assessment & Plan Note (Addendum)
 Ongoing for several months. She is point tender at R SIJ and at R lower lumbar paraspinous mm. Check SIJ filims, check ANA, ESR, CRP to evaluate for inflammatory arthritis etiology.  Continue tramadol  50mg  sparingly - discussed could try 2 at a time for increased pain.

## 2024-03-21 NOTE — Progress Notes (Signed)
 Ph: [phone] Fax: [phone]   [name], female    DOB: 1966/04/24, 58 y.o.   MRN: [id number]  This visit was conducted in person.  BP 132/78   Pulse 70   Temp 98.2 F (36.8 C) (Oral)   Ht 5' 5.75 (1.67 m)   Wt 200 lb 8 oz (90.9 kg)   SpO2 99%   BMI 32.61 kg/m   BP Readings from Last 3 Encounters:  03/21/24 132/78  12/21/23 124/84  11/17/23 124/84   CC: weight management  Subjective:   HPI: Jasmine Willis is a 58 y.o. female presenting on 03/21/2024 for Weight Management Screening   Starting weight: 207 lbs  Last weight: 200 lbs Today's weight 200 lbs  Phentermine  30mg  daily restarted 11/2022, filled 12/2022, 01/2023, 02/2023, 05/26/2023, 06/2023, 07/2023.    Intolerant to other meds tried - saxenda  (nausea), Zepbound  (nausea). She would be interested in retrial of this but it has been unaffordable.  Contrave  was unaffordable.  Zepbound  retrial was unaffordable ($980) Not interested in weight loss surgery.   She continues bupropion  SR 100mg  bid. Not taking naltrexone  as she's using tramadol .   Chronic pain due to R hip and back and knees.  Manages with gabapentin  100mg  twice daily and tramadol  2-3 times/wk. She takes hydroxyzine  25mg  BID PRN anxiety - rare use.  Worse pain in the mornings.   Notes months of worsening low back pain into bilateral hips/buttock, denies inciting trauma/injury. She has been caring for husband who's recently had falls.  Continues tramadol  50mg  BID PRN - takes this about twice a week.  Continues gabapentin  100mg  TID.   24 hour recall: Skipped breakfast - norm 10am snack - 2 tangerines with small pepsi  12pm lunch - brisket, mac&cheese, small salad (lettuce/tomatoes) with lemonade  3pm snack - 2 tangerines with water and lemonade 6pm dinner - chicken cordon bleu with potato salad, flavored water 8pm snack - some popcorn   Activity regimen: Activity limited by knee osteoarthritis pain. Sees ortho Krasinski.  Completed PT 02/2023.   No fmhx CAD.  The 10-year ASCVD risk score (Arnett DK, et al., 2019) is: 2.7%*   Values used to calculate the score:     Age: 43 years     Clincally relevant sex: Female     Is Non-Hispanic African American: No     Diabetic: No     Tobacco smoker: No     Systolic Blood Pressure: 132 mmHg     Is BP treated: No     HDL Cholesterol: 46 mg/dL*     Total Cholesterol: 196 mg/dL*     * - Cholesterol units were assumed for this score calculation     Relevant past medical, surgical, family and social history reviewed and updated as indicated. Interim medical history since our last visit reviewed. Allergies and medications reviewed and updated. Outpatient Medications Prior to Visit  Medication Sig Dispense Refill   gabapentin  (NEURONTIN ) 100 MG capsule Take 1 capsule (100 mg total) by mouth 3 (three) times daily. 90 capsule 3   hydrOXYzine  (ATARAX ) 25 MG tablet Take 1 tablet (25 mg total) by mouth 2 (two) times daily as needed for anxiety. 30 tablet 3   methocarbamol  (ROBAXIN ) 500 MG tablet Take 1 tablet (500 mg total) by mouth 2 (two) times daily. 60 tablet 3   ondansetron  (ZOFRAN ) 4 MG tablet Take 1 tablet (4 mg total) by mouth every 8 (eight) hours as needed for nausea or vomiting. 20 tablet  0   traMADol  (ULTRAM ) 50 MG tablet Take 1 tablet (50 mg total) by mouth 2 (two) times daily as needed for moderate pain (pain score 4-6) (sedation pecautions). 30 tablet 0   buPROPion  ER (WELLBUTRIN  SR) 100 MG 12 hr tablet Take 1 tablet (100 mg total) by mouth 2 (two) times daily. 60 tablet 3   naltrexone  (DEPADE) 50 MG tablet Take 0.5 tablets (25 mg total) by mouth daily. 15 tablet 3   diclofenac  (VOLTAREN ) 75 MG EC tablet Take 1 tablet (75 mg total) by mouth 2 (two) times daily. 30 tablet 3   No facility-administered medications prior to visit.     Per HPI unless specifically indicated in ROS section below Review of Systems  Objective:  BP 132/78   Pulse 70   Temp 98.2 F  (36.8 C) (Oral)   Ht 5' 5.75 (1.67 m)   Wt 200 lb 8 oz (90.9 kg)   SpO2 99%   BMI 32.61 kg/m   Wt Readings from Last 3 Encounters:  03/21/24 200 lb 8 oz (90.9 kg)  12/21/23 195 lb 6 oz (88.6 kg)  11/17/23 200 lb 8 oz (90.9 kg)      Physical Exam Vitals and nursing note reviewed.  Constitutional:      Appearance: Normal appearance. She is not ill-appearing.  HENT:     Mouth/Throat:     Mouth: Mucous membranes are moist.     Pharynx: Oropharynx is clear. No oropharyngeal exudate or posterior oropharyngeal erythema.  Eyes:     Extraocular Movements: Extraocular movements intact.     Pupils: Pupils are equal, round, and reactive to light.  Cardiovascular:     Rate and Rhythm: Normal rate and regular rhythm.     Pulses: Normal pulses.     Heart sounds: Normal heart sounds. No murmur heard. Pulmonary:     Effort: Pulmonary effort is normal. No respiratory distress.     Breath sounds: Normal breath sounds. No wheezing, rhonchi or rales.  Musculoskeletal:        General: Tenderness present.     Right lower leg: No edema.     Left lower leg: No edema.     Comments:  No significant pain midline spine + R lower lumbar paraspinous mm tenderness Neg SLR bilaterally. No significant pain with int/ext rotation at hip. Neg FABER. No pain at GTB or sciatic notch bilaterally.  + pain to R SIJ   Skin:    General: Skin is warm and dry.     Findings: No rash.  Neurological:     General: No focal deficit present.     Mental Status: She is alert.     Sensory: Sensation is intact.     Motor: Motor function is intact.     Coordination: Coordination is intact.     Gait: Gait abnormal.     Comments:  5/5 strength BLE Antalgic gait  Psychiatric:        Mood and Affect: Mood normal.        Behavior: Behavior normal.       Results for orders placed or performed in visit on 12/22/23  Iron, TIBC and Ferritin Panel   Collection Time: 12/14/23 12:00 AM  Result Value Ref Range    Ferritin 128    Iron 79   CBC and differential   Collection Time: 12/14/23 12:00 AM  Result Value Ref Range   Hemoglobin 13.8 12.0 - 16.0   WBC 6.7   CBC   Collection  Time: 12/14/23 12:00 AM  Result Value Ref Range   RBC 4.84 3.87 - 5.11  VITAMIN D  25 Hydroxy (Vit-D Deficiency, Fractures)   Collection Time: 12/14/23 12:00 AM  Result Value Ref Range   Vit D, 25-Hydroxy 49.6   Basic metabolic panel with GFR   Collection Time: 12/14/23 12:00 AM  Result Value Ref Range   Glucose 85    Creatinine 1.0 0.5 - 1.1   Potassium 4.9 3.5 - 5.1 mEq/L   Sodium 141 137 - 147  Comprehensive metabolic panel with GFR   Collection Time: 12/14/23 12:00 AM  Result Value Ref Range   Calcium 9.7 8.7 - 10.7  Lipid panel   Collection Time: 12/14/23 12:00 AM  Result Value Ref Range   Triglycerides 71 40 - 160   Cholesterol 196 0 - 200   HDL 46 35 - 70   LDL Cholesterol 137   Hepatic function panel   Collection Time: 12/14/23 12:00 AM  Result Value Ref Range   Alkaline Phosphatase 98 25 - 125   ALT 19 7 - 35 U/L   AST 18 13 - 35   Bilirubin, Total 0.6   Vitamin B12   Collection Time: 12/14/23 12:00 AM  Result Value Ref Range   Vitamin B-12 687   TSH   Collection Time: 12/14/23 12:00 AM  Result Value Ref Range   TSH 2.63 0.41 - 5.90      03/21/2024    8:09 AM 12/21/2023    2:16 PM 11/17/2023    2:07 PM 05/31/2023    9:15 AM 11/11/2022    3:51 PM  Depression screen PHQ 2/9  Decreased Interest 0 0 1 0 0  Down, Depressed, Hopeless 0 2 0 0 0  PHQ - 2 Score 0 2 1 0 0  Altered sleeping 0 0 0 0   Tired, decreased energy 0 1 3 3    Change in appetite 0 0 0 0   Feeling bad or failure about yourself  0 0 0 0   Trouble concentrating 0 0 0 0   Moving slowly or fidgety/restless 0 0 3 3   Suicidal thoughts 0 0 0 0   PHQ-9 Score 0 3 7 6    Difficult doing work/chores Not difficult at all Not difficult at all Somewhat difficult Somewhat difficult        03/21/2024    8:10 AM 12/21/2023    2:16 PM  11/17/2023    2:08 PM 05/31/2023    9:16 AM  GAD 7 : Generalized Anxiety Score  Nervous, Anxious, on Edge 0 1 0 0  Control/stop worrying 0 0 0 0  Worry too much - different things 0 0 0 0  Trouble relaxing 0 0 0 0  Restless 0 3 3 0  Easily annoyed or irritable 0 1 0 0  Afraid - awful might happen 0 1 1 0  Total GAD 7 Score 0 6 4 0  Anxiety Difficulty Not difficult at all Somewhat difficult Somewhat difficult    Assessment & Plan:   Problem List Items Addressed This Visit     Obesity (BMI 30.0-34.9) - Primary   Activity limited by arthralgias.  Reviewed 24 hour food diary, recommendations provided. Continues wellbutrin  SR once daily - will increase to BID dosing.  Not taking naltrexone  as she's been taking more tramadol  as per below. Will restart Phentermine  30mg  daily - reviewing stimulant effect, cardiovascular risk. She is deemed overall at low cardiovascular risk.  RTC 2-3 months  weight management visit.       Depression with anxiety   Chronic, deterioration endorsed, attributes to stressful work situation, caring for husband, [place].  She's only been taking bupropion  SR 100mg  once daily - encouraged she try BID dosing and update with effect.  Continue hydroxyzine  PRN.       Relevant Medications   buPROPion  ER (WELLBUTRIN  SR) 100 MG 12 hr tablet   Primary osteoarthritis of right knee   Chronic pain of right knee   Completed PT 02/2023.  Last steroid injection was about a year ago.  Planning to return to see ortho over next several months.       Chronic right-sided low back pain without sciatica   Ongoing for several months. She is point tender at R SIJ and at R lower lumbar paraspinous mm. Check SIJ filims, check ANA, ESR, CRP to evaluate for inflammatory arthritis etiology.  Continue tramadol  50mg  sparingly - discussed could try 2 at a time for increased pain.       Relevant Medications   buPROPion  ER  (WELLBUTRIN  SR) 100 MG 12 hr tablet   Other Relevant Orders   DG Si Joints   Sedimentation rate   C-reactive protein   ANA     Meds ordered this encounter  Medications   phentermine  30 MG capsule    Sig: Take 1 capsule (30 mg total) by mouth every morning.    Dispense:  30 capsule    Refill:  3   buPROPion  ER (WELLBUTRIN  SR) 100 MG 12 hr tablet    Sig: Take 1 tablet (100 mg total) by mouth 2 (two) times daily.    Dispense:  60 tablet    Refill:  6    Orders Placed This Encounter  Procedures   DG Si Joints    Standing Status:   Future    Number of Occurrences:   1    Expiration Date:   03/21/2025    Reason for Exam (SYMPTOM  OR DIAGNOSIS REQUIRED):   R SI joint pain x 2 months    Is patient pregnant?:   No    Preferred imaging location?:   [facility]   Sedimentation rate   C-reactive protein   ANA    Patient Instructions  May restart phentermine  30mg  daily for appetite suppression.  Sacroiliac joint xray today for months of right sided low back pain.  Labs today  Return in 2-3 months for weight management visit    Follow up plan: Return in about 3 months (around 06/21/2024) for follow up visit.  Anton Blas, MD

## 2024-03-21 NOTE — Patient Instructions (Signed)
 May restart phentermine  30mg  daily for appetite suppression.  Sacroiliac joint xray today for months of right sided low back pain.  Labs today  Return in 2-3 months for weight management visit

## 2024-03-21 NOTE — Assessment & Plan Note (Signed)
 Chronic, deterioration endorsed, attributes to stressful work situation, caring for husband, upcoming stressful drive [place] the [place] for son's wedding.  She's only been taking bupropion  SR 100mg  once daily - encouraged she try BID dosing and update with effect.  Continue hydroxyzine  PRN.

## 2024-03-21 NOTE — Assessment & Plan Note (Signed)
 Completed PT 02/2023.  Last steroid injection was about a year ago.  Planning to return to see ortho over next several months.

## 2024-03-22 LAB — ANA: Anti Nuclear Antibody (ANA): NEGATIVE

## 2024-03-25 ENCOUNTER — Ambulatory Visit: Payer: Self-pay | Admitting: Family Medicine

## 2024-04-11 ENCOUNTER — Other Ambulatory Visit: Payer: Self-pay

## 2024-04-18 ENCOUNTER — Other Ambulatory Visit: Payer: Self-pay

## 2024-04-29 ENCOUNTER — Other Ambulatory Visit: Payer: Self-pay | Admitting: Family Medicine

## 2024-04-29 DIAGNOSIS — M1711 Unilateral primary osteoarthritis, right knee: Secondary | ICD-10-CM

## 2024-04-30 ENCOUNTER — Other Ambulatory Visit: Payer: Self-pay

## 2024-05-01 ENCOUNTER — Other Ambulatory Visit: Payer: Self-pay

## 2024-05-01 MED ORDER — METHOCARBAMOL 500 MG PO TABS
500.0000 mg | ORAL_TABLET | Freq: Two times a day (BID) | ORAL | 3 refills | Status: DC
  Filled 2024-05-01: qty 60, 30d supply, fill #0
  Filled 2024-05-30: qty 60, 30d supply, fill #1
  Filled 2024-06-28: qty 60, 30d supply, fill #2
  Filled 2024-07-24: qty 60, 30d supply, fill #3

## 2024-05-01 MED ORDER — TRAMADOL HCL 50 MG PO TABS
50.0000 mg | ORAL_TABLET | Freq: Two times a day (BID) | ORAL | 0 refills | Status: DC | PRN
  Filled 2024-05-01: qty 30, 15d supply, fill #0

## 2024-05-01 NOTE — Telephone Encounter (Signed)
 ERx

## 2024-05-01 NOTE — Telephone Encounter (Signed)
 Name of Medication:  Tramadol  Name of Pharmacy:  [facility] Last Fill or Written Date and Quantity:  03/07/24, #30 Last Office Visit and Type:  03/21/24, 3 mo wt mgmt f/u Next Office Visit and Type:  none Last Controlled Substance Agreement Date:  none Last UDS:  none  Robaxin  last filled:  03/27/24, 360.

## 2024-05-16 ENCOUNTER — Other Ambulatory Visit: Payer: Self-pay

## 2024-05-30 ENCOUNTER — Other Ambulatory Visit: Payer: Self-pay | Admitting: Family Medicine

## 2024-05-30 ENCOUNTER — Other Ambulatory Visit: Payer: Self-pay

## 2024-05-30 DIAGNOSIS — M1711 Unilateral primary osteoarthritis, right knee: Secondary | ICD-10-CM

## 2024-05-30 NOTE — Telephone Encounter (Signed)
 Name of Medication:  Tramadol  Name of Pharmacy:  [facility] Last Fill or Written Date and Quantity:  05/01/24, #30 Last Office Visit and Type:  03/21/24, 3 mo wt mgmt f/u Next Office Visit and Type:  none Last Controlled Substance Agreement Date:  none Last UDS:  none   Gabapentin  last filled:  05/01/24, #90

## 2024-06-01 ENCOUNTER — Other Ambulatory Visit: Payer: Self-pay

## 2024-06-01 ENCOUNTER — Other Ambulatory Visit: Payer: Self-pay | Admitting: Family Medicine

## 2024-06-01 DIAGNOSIS — M1711 Unilateral primary osteoarthritis, right knee: Secondary | ICD-10-CM

## 2024-06-01 MED FILL — Gabapentin Cap 100 MG: ORAL | 30 days supply | Qty: 90 | Fill #0 | Status: AC

## 2024-06-01 MED FILL — Tramadol HCl Tab 50 MG: ORAL | 15 days supply | Qty: 30 | Fill #0 | Status: AC

## 2024-06-01 NOTE — Telephone Encounter (Signed)
 ERx

## 2024-06-14 ENCOUNTER — Other Ambulatory Visit: Payer: Self-pay

## 2024-06-28 ENCOUNTER — Other Ambulatory Visit: Payer: Self-pay | Admitting: Family Medicine

## 2024-06-28 DIAGNOSIS — M1711 Unilateral primary osteoarthritis, right knee: Secondary | ICD-10-CM

## 2024-06-28 MED FILL — Gabapentin Cap 100 MG: ORAL | 30 days supply | Qty: 90 | Fill #1 | Status: AC

## 2024-06-29 ENCOUNTER — Other Ambulatory Visit: Payer: Self-pay

## 2024-06-29 ENCOUNTER — Other Ambulatory Visit: Payer: Self-pay | Admitting: Family Medicine

## 2024-06-29 DIAGNOSIS — M1711 Unilateral primary osteoarthritis, right knee: Secondary | ICD-10-CM

## 2024-06-29 NOTE — Telephone Encounter (Signed)
 Name of Medication: Tramadol  50 mg Name of Pharmacy: [facility] Last Fill or Written Date and Quantity: 06/01/24 Last Office Visit and Type: 03/21/24 OV Next Office Visit and Type: no appt scheduled Last Controlled Substance Agreement Date:  Last UDS:

## 2024-06-30 ENCOUNTER — Other Ambulatory Visit: Payer: Self-pay

## 2024-06-30 MED FILL — Tramadol HCl Tab 50 MG: ORAL | 15 days supply | Qty: 30 | Fill #0 | Status: AC

## 2024-06-30 NOTE — Telephone Encounter (Signed)
 Name of Medication: Tramadol  50 mg  Name of Pharmacy: [facility] pharmacy  Last Fill or Written Date and Quantity: 06/02/24 #30 Last Office Visit and Type: 03/21/24 Next Office Visit and Type: no follow up  Last Controlled Substance Agreement Date: n/a  Last UDS: n/a

## 2024-06-30 NOTE — Telephone Encounter (Signed)
 ERx

## 2024-07-03 DIAGNOSIS — M545 Low back pain, unspecified: Secondary | ICD-10-CM | POA: Diagnosis not present

## 2024-07-03 DIAGNOSIS — G8929 Other chronic pain: Secondary | ICD-10-CM | POA: Diagnosis not present

## 2024-07-03 DIAGNOSIS — M25561 Pain in right knee: Secondary | ICD-10-CM | POA: Diagnosis not present

## 2024-07-03 NOTE — Progress Notes (Signed)
 History of Present Illness: Jasmine Willis is a 58 y.o. female who presents for evaluation and treatment of her right-sided lower back and right buttock pain with radiation to right thigh.  The symptoms have been present for about 5 years and developed without any apparent cause or injury.  The pain is mild, intermittent, and 3-4 on a scale of 1-10.  She describes the pain as aching and dull.  The symptoms are aggravated by standing, walking, stooping or bending, lifting, and carrying heavy loads.  The symptoms improve with sitting, lying down, and rest.  She also has been taking Neurontin  and Robaxin  as necessary with temporary partial relief of her symptoms.  She denies any numbness, weakness, paresthesias to either lower extremity.  She reports  no bladder, bowel or sexual dysfunction.  She has tried one epidural injection in the past did provide temporary partial relief of the symptoms.  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  The patient also presents complaining of a 5-year history of medial sided right knee pain.  The symptoms developed without any specific cause or injury but are worse with any prolonged standing or ambulation.  She also has difficulty reciprocating stairs.  She notes that these symptoms also can bother her occasionally at night.  She is ambulating without any assistive devices.  She had received several steroid injections about 3 years ago at an orthopedic center in [place] which she states provided temporary relief of her symptoms.  She denies any recent reinjury to the knee, and denies any numbness or paresthesias down her leg to her foot.  Current Outpatient Medications  Medication Sig Dispense Refill  . buPROPion  (WELLBUTRIN  SR) 100 MG SR tablet Take 100 mg by mouth once daily    . gabapentin  (NEURONTIN ) 100 MG capsule Take 100 mg by mouth 2 (two) times daily    . methocarbamoL  (ROBAXIN ) 500 MG tablet Take  500 mg by mouth 2 (two) times daily     No current facility-administered medications for this visit.   No Known Allergies Past Medical History:  Diagnosis Date  . Abnormal uterine bleeding 2015   fibroids   Past Surgical History:  Procedure Laterality Date  . ENDOMETRIAL ABLATION  10/03/2020   Novasure  . ENDOMETRIAL ABLATION W/ NOVASURE  10/03/2020    Family History  Problem Relation Name Age of Onset  . Diabetes Maternal Grandfather      Social History   Socioeconomic History  . Marital status: Married  Tobacco Use  . Smoking status: Never  . Smokeless tobacco: Never  Substance and Sexual Activity  . Alcohol use: No  . Drug use: No  . Sexual activity: Yes    Partners: Male    Birth control/protection: None, Other-see comments    Comment: spouse has vasectomy   Social Drivers of Corporate Investment Banker Strain: Patient Declined (03/20/2024)   Received from [facility]   Overall Financial Resource Strain (CARDIA)   . How hard is it for you to pay for the very basics like food, housing, medical care, and heating?: Patient declined  Food Insecurity: Patient Declined (03/20/2024)   Received from [facility]   Hunger Vital Sign   . Within the past 12 months, you worried that your food would run out before you got the money to buy more.: Patient declined   . Within the past 12 months, the food you bought just didn't last and  you didn't have money to get more.: Patient declined  Transportation Needs: Patient Declined (03/20/2024)   Received from [facility] - Transportation   . In the past 12 months, has lack of transportation kept you from medical appointments or from getting medications?: Patient declined   . In the past 12 months, has lack of transportation kept you from meetings, work, or from getting things needed for daily living?: Patient declined    Review of Systems:  A comprehensive 14 point ROS was performed, reviewed, and the pertinent orthopaedic  findings are documented in the HPI.  Physical Exam: Vitals:   07/03/24 1051  BP: 118/80  Weight: 90.7 kg (200 lb)  Height: 172.7 cm (5' 8)  PainSc:   5  PainLoc: Knee   General/Constitutional: The patient appears to be well-nourished, well-developed, and in no acute distress. Neuro/Psych: Normal mood and affect, oriented to person, place and time. Eyes: Non-icteric.  Pupils are equal, round, and reactive to light, and exhibit synchronous movement. ENT: Unremarkable. Lymphatic: No palpable adenopathy. Respiratory: Lungs clear to auscultation, Normal chest excursion, No wheezes, and Non-labored breathing Cardiovascular: Regular rate and rhythm.  No murmurs. and No edema, swelling or tenderness, except as noted in detailed exam. Integumentary: No impressive skin lesions present, except as noted in detailed exam. Musculoskeletal: Unremarkable, except as noted in detailed exam.  Lumbar exam: Skin inspection of the lower back is unremarkable.  In stance, her spine is notable for mild lumbar scoliotic curve, but her pelvis is level.  She can arise from a seated position without difficulty, and demonstrates a normal gait.  She does not use any assistive devices for ambulation.  She has no tenderness along the thoracic or lumbar spine, nor across the sacrum.  She has no pain over either sciatic notch, either SI joint, or either trochanteric region.  She can forward flex to 90 degrees and extend to 10 degrees with no discomfort in both flexion and extension.  She can heel raise and toe raise without difficulty or weakness.    Hip exam: She has no pain with bilateral hip range of motion.  She exhibits full range of motion bilaterally.  She has negative sitting straight leg raises bilaterally.  Right knee exam: ALIGNMENT: mild varus SKIN: unremarkable SWELLING: minimal EFFUSION: trace WARMTH: no warmth TENDERNESS: mild over the medial joint line, but no lateral or peripatellar tenderness ROM:  full without pain McMURRAY'S: equivocal PATELLOFEMORAL: normal tracking with no peri-patellar tenderness and negative apprehension sign CREPITUS: Minimal patellofemoral crepitance LACHMAN'S: negative PIVOT SHIFT: Not evaluated ANTERIOR DRAWER: negative POSTERIOR DRAWER: negative VARUS/VALGUS: positive pseudolaxity to varus stressing  She is grossly neurovascularly intact in the right lower extremity and foot.  X-rays/MRI/Lab data:   X-rays of the lumbar spine and pelvis are obtained.  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  There is a moderate degenerative scoliosis involving the mid and lower lumbar spine which is reasonably well compensated as her pelvis appears to be level.  There is no evidence of spondylolysis or spondylolisthesis.  No lytic lesions or fractures are identified.  AP weightbearing of both knees, as well as lateral and merchant views of the right knee are obtained.  These films demonstrate severe degenerative changes, primarily involving the medial compartment with 90-100% Medial joint space narrowing.  Lesser degenerative changes of the lateral patellofemoral compartments also are noted.  Overall alignment is mild  varus.  No fractures, lytic lesions, or abnormal calcifications are noted.  Assessment: Encounter Diagnoses  Name Primary?  . Right knee pain, unspecified chronicity Yes  . Chronic low back pain, unspecified back pain laterality, unspecified whether sciatica present     Plan: The treatment options were discussed with the patient.  In addition, patient educational materials were provided regarding the diagnosis and treatment options.  Regarding her lower back symptoms, the patient is frustrated by her symptoms, but feels that they are manageable at this time.  Therefore, I have recommended a home exercise program and weight loss.  A sheet of stretching and  strengthening exercises for her lower back has been provided to her for her to begin to work on at home.  Regarding her right knee symptoms, the patient is becoming increasingly frustrated by her symptoms and function limitations, and is ready to consider more aggressive treatment options.  She is offered but declines any additional injections.  Therefore, I have recommended a surgical procedure, specifically a right total knee arthroplasty.  The procedure was discussed with the patient, as were the potential risks (including bleeding, infection, nerve and/or blood vessel injury, persistent or recurrent pain, loosening and/or failure of the components, dislocation, need for further surgery, blood clots, strokes, heart attacks and/or arhythmias, pneumonia, etc.) and benefits.  The patient states his/her understanding and wishes to proceed.  All of the patient's questions and concerns were answered.  She can call any time with further concerns.  She will return to work without restrictions.  She will follow up post-surgery, routine.  This office visit took 45 minutes, of which >50% involved patient counseling/education.

## 2024-07-04 ENCOUNTER — Telehealth: Payer: Self-pay

## 2024-07-04 NOTE — Telephone Encounter (Signed)
 Copied from CRM [id number]. Topic: General - Other >> Jul 04, 2024  2:57 PM Berneda FALCON wrote: Reason for CRM: Patient states that the FMLA needs some adjustments on it as it is missing the patient information (such as his name, DOB, etc.) and that the Kayleeann's name is spelled wrong. There are several things that need to be corrected and sent back to [employer] please.Patient is [employer] employee.  Verba sent over the fax about 10 min ago and I did verify she sent it to the correct fax number.  This is in reference to spouse-MRN-[id number] Jasmine Willis whom she is requesting the FMLA for in order to care for. Documents for original FMLA look to be in his chart, not hers.  Please be on the lookout for this fax and ensure Dr. KANDICE and/or staff make the adjustments, fax back to [employer], and notify the patient that this is done.  [phone]

## 2024-07-05 ENCOUNTER — Other Ambulatory Visit: Payer: Self-pay

## 2024-07-05 NOTE — Telephone Encounter (Signed)
 Form has been updated and signed by provider to reflect spouses name and DOB as requested.  Forms have been faxed to [employer] for review.  Spoke with the patient, and left a message for the patients wife letting her know the forms were updated/faxed, and to let us  know if she needed anything else.

## 2024-07-24 ENCOUNTER — Other Ambulatory Visit: Payer: Self-pay

## 2024-07-24 ENCOUNTER — Other Ambulatory Visit: Payer: Self-pay | Admitting: Family Medicine

## 2024-07-24 DIAGNOSIS — M1711 Unilateral primary osteoarthritis, right knee: Secondary | ICD-10-CM

## 2024-07-24 DIAGNOSIS — E66811 Obesity, class 1: Secondary | ICD-10-CM

## 2024-07-24 MED FILL — Gabapentin Cap 100 MG: ORAL | 30 days supply | Qty: 90 | Fill #2 | Status: AC

## 2024-07-24 NOTE — Telephone Encounter (Signed)
 Name of Medication:  Phentermine , Tramadol  Name of Pharmacy:  [facility] Last Fill or Written Date and Quantity:       Phentermine - 06/29/24, #30      Tramadol - 07/05/24, #30 Last Office Visit and Type:  03/21/24, wt mgmt Next Office Visit and Type:  none Last Controlled Substance Agreement Date:  none Last UDS:  none

## 2024-07-25 ENCOUNTER — Other Ambulatory Visit: Payer: Self-pay

## 2024-07-25 MED FILL — Tramadol HCl Tab 50 MG: ORAL | 15 days supply | Qty: 30 | Fill #0 | Status: AC

## 2024-07-25 MED FILL — Phentermine HCl Cap 30 MG: ORAL | 30 days supply | Qty: 30 | Fill #0 | Status: AC

## 2024-07-25 NOTE — Telephone Encounter (Signed)
 ERx 1 month Please schedule weight management f/u visit prior to more refills.

## 2024-08-08 ENCOUNTER — Other Ambulatory Visit: Payer: Self-pay | Admitting: Family Medicine

## 2024-08-08 ENCOUNTER — Other Ambulatory Visit: Payer: Self-pay

## 2024-08-08 DIAGNOSIS — M1711 Unilateral primary osteoarthritis, right knee: Secondary | ICD-10-CM

## 2024-08-08 NOTE — Telephone Encounter (Signed)
 Unable to reach pt at this time, left voicemail instructing pt to call back to schedule with our office   If Pt calls back please inform them prescription are not able to be filled until seen in office with Dr. Rilla and schedule them a appt.

## 2024-08-09 ENCOUNTER — Other Ambulatory Visit: Payer: Self-pay | Admitting: Family Medicine

## 2024-08-09 ENCOUNTER — Other Ambulatory Visit: Payer: Self-pay

## 2024-08-09 DIAGNOSIS — M1711 Unilateral primary osteoarthritis, right knee: Secondary | ICD-10-CM

## 2024-08-09 NOTE — Telephone Encounter (Signed)
 Duplicate RX request

## 2024-08-10 ENCOUNTER — Other Ambulatory Visit: Payer: Self-pay

## 2024-08-10 ENCOUNTER — Other Ambulatory Visit: Payer: Self-pay | Admitting: Family Medicine

## 2024-08-10 DIAGNOSIS — E66811 Obesity, class 1: Secondary | ICD-10-CM

## 2024-08-10 DIAGNOSIS — M1711 Unilateral primary osteoarthritis, right knee: Secondary | ICD-10-CM

## 2024-08-11 MED FILL — Tramadol HCl Tab 50 MG: ORAL | 15 days supply | Qty: 30 | Fill #0 | Status: AC

## 2024-08-11 MED FILL — Phentermine HCl Cap 30 MG: ORAL | 30 days supply | Qty: 30 | Fill #0 | Status: CN

## 2024-08-11 NOTE — Telephone Encounter (Signed)
 Attempted to contact pt. No answer, no vm. Pls schedule wt mgmt f/u OV for refills, per previous messages (see 07/26/24 MyChart msg; 08/08/24 refill note).   Fyi, to Dr KANDICE.

## 2024-08-11 NOTE — Telephone Encounter (Signed)
 ERx x1 month. Agree with no further refills of phentermine  until weight management office visit

## 2024-09-04 ENCOUNTER — Encounter: Payer: Self-pay | Admitting: Family Medicine

## 2024-09-04 ENCOUNTER — Other Ambulatory Visit: Payer: Self-pay

## 2024-09-04 ENCOUNTER — Ambulatory Visit: Admitting: Family Medicine

## 2024-09-04 ENCOUNTER — Telehealth: Payer: Self-pay | Admitting: Family Medicine

## 2024-09-04 VITALS — BP 136/88 | HR 75 | Temp 97.8°F | Ht 65.75 in | Wt 201.0 lb

## 2024-09-04 DIAGNOSIS — M1711 Unilateral primary osteoarthritis, right knee: Secondary | ICD-10-CM

## 2024-09-04 DIAGNOSIS — R030 Elevated blood-pressure reading, without diagnosis of hypertension: Secondary | ICD-10-CM | POA: Diagnosis not present

## 2024-09-04 DIAGNOSIS — F418 Other specified anxiety disorders: Secondary | ICD-10-CM | POA: Diagnosis not present

## 2024-09-04 DIAGNOSIS — R7989 Other specified abnormal findings of blood chemistry: Secondary | ICD-10-CM

## 2024-09-04 DIAGNOSIS — E559 Vitamin D deficiency, unspecified: Secondary | ICD-10-CM

## 2024-09-04 DIAGNOSIS — E66811 Obesity, class 1: Secondary | ICD-10-CM

## 2024-09-04 MED ORDER — METHOCARBAMOL 500 MG PO TABS
500.0000 mg | ORAL_TABLET | Freq: Two times a day (BID) | ORAL | 3 refills | Status: AC
  Filled 2024-09-04: qty 60, 30d supply, fill #0
  Filled 2024-09-29: qty 60, 30d supply, fill #1

## 2024-09-04 MED ORDER — BUPROPION HCL ER (SR) 100 MG PO TB12
100.0000 mg | ORAL_TABLET | Freq: Two times a day (BID) | ORAL | 6 refills | Status: AC
  Filled 2024-09-04: qty 60, 30d supply, fill #0
  Filled 2024-09-29: qty 60, 30d supply, fill #1

## 2024-09-04 MED ORDER — PHENTERMINE HCL 30 MG PO CAPS
30.0000 mg | ORAL_CAPSULE | ORAL | 0 refills | Status: AC
  Filled 2024-09-04: qty 30, 30d supply, fill #0

## 2024-09-04 MED ORDER — TRAMADOL HCL 50 MG PO TABS
50.0000 mg | ORAL_TABLET | Freq: Two times a day (BID) | ORAL | 0 refills | Status: AC | PRN
  Filled 2024-09-04: qty 30, 15d supply, fill #0

## 2024-09-04 NOTE — Patient Instructions (Addendum)
 Continue phentermine  for now.  Monitor blood pressures at home, let me know if consistently >140/90. Limit salt/sodium in diet, drink plenty of water.  Follow up with Dr Edie for knee arthritis /surgery plan Message me in 1-2 week to see if oral wegovy pills are available.  Good to see you today  Return as needed or in 3 months for physical (after 12/17/2024)

## 2024-09-04 NOTE — Assessment & Plan Note (Addendum)
 Stable period on phentermine  30mg  daily, overall tolerating well.  Refilled today. Discussed oral wegovy pill - to message me in 1-2 wks to see if available to prescribe through EMR - currently still not available

## 2024-09-04 NOTE — Assessment & Plan Note (Signed)
 Chronic, stable period on wellbutrin  SR 100mg  bid - continue.

## 2024-09-04 NOTE — Assessment & Plan Note (Signed)
 BP elevated today - improved on recheck. Rec limit salt/sodium Rec increased water intake.

## 2024-09-04 NOTE — Telephone Encounter (Signed)
 Patient would like to know if labs can be done at [facility] prior to CPE.

## 2024-09-04 NOTE — Assessment & Plan Note (Signed)
 Contributes to chronic pain She will f/u with [facility] - discussing surgery.

## 2024-09-04 NOTE — Progress Notes (Signed)
 " Ph: [phone] Fax: [phone]   [name], female    DOB: 26-Feb-1966, 59 y.o.   MRN: [id number]  This visit was conducted in person.  BP 136/88 (BP Location: Right Arm, Cuff Size: Normal)   Pulse 75   Temp 97.8 F (36.6 C) (Oral)   Ht 5' 5.75 (1.67 m)   Wt 201 lb (91.2 kg)   SpO2 97%   BMI 32.69 kg/m   BP Readings from Last 3 Encounters:  09/04/24 136/88  03/21/24 132/78  12/21/23 124/84   CC: medication management  Subjective:   HPI: Jasmine Willis is a 59 y.o. female presenting on 09/04/2024 for Medical Management of Chronic Issues (Medication management /Has been taking tramadol  more often because of back and knee pain and has beeen out of phentermine  for 1 month. States she needs refills on everything)   Last seen 02/2024 Starting weight: 207 lbs Last weight: 200 lbs Today's weight 201 lbs  She continues bupropion  SR 100mg  bid.  She also continues phentermine  30mg  every other day.  Tolerating well without HA, anorexia, nausea, chest pain, dyspnea or insomnia.  Intolerant to other meds tried - saxenda  (nausea), Zepbound  (nausea). She would be interested in retrial of this but it has been unaffordable.  Contrave  was unaffordable.  Zepbound  retrial was unaffordable ($980) Not interested in weight loss surgery.   No fmhx medullary thyroid  cancer. 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  Manages with gabapentin  100mg  TID, robaxin  500mg  BID, tramadol  2-3x/wk - increased use to BID PRN recently due to increasing pain.  Notes increased lower back pain.  Latest lumbar xrays 02/2019 with minimal DDD. Pt states she had recent imaging by ortho [facility].  No radiculopathy, numbness or weakness of legs, bowel/bladder incontinence.   Has seen Dr Marchia ortho in [place] - planning to see Dr Edie.  Completed PT 02/2023.      Relevant past medical,  surgical, family and social history reviewed and updated as indicated. Interim medical history since our last visit reviewed. Allergies and medications reviewed and updated. Outpatient Medications Prior to Visit  Medication Sig Dispense Refill   gabapentin  (NEURONTIN ) 100 MG capsule Take 1 capsule (100 mg total) by mouth 3 (three) times daily. 90 capsule 3   hydrOXYzine  (ATARAX ) 25 MG tablet Take 1 tablet (25 mg total) by mouth 2 (two) times daily as needed for anxiety. 30 tablet 3   ondansetron  (ZOFRAN ) 4 MG tablet Take 1 tablet (4 mg total) by mouth every 8 (eight) hours as needed for nausea or vomiting. 20 tablet 0   buPROPion  ER (WELLBUTRIN  SR) 100 MG 12 hr tablet Take 1 tablet (100 mg total) by mouth 2 (two) times daily. 60 tablet 6   methocarbamol  (ROBAXIN ) 500 MG tablet Take 1 tablet (500 mg total) by mouth 2 (two) times daily. 60 tablet 3   phentermine  30 MG capsule Take 1 capsule (30 mg total) by mouth every morning. 30 capsule 0   traMADol  (ULTRAM ) 50 MG tablet Take 1 tablet (50 mg total) by mouth 2 (two) times daily as needed for moderate pain (pain score 4-6) (sedation pecautions). 30 tablet 0   No facility-administered medications prior to visit.     Per HPI unless specifically indicated in ROS section below Review of Systems  Objective:  BP 136/88 (BP Location: Right Arm, Cuff Size: Normal)  Pulse 75   Temp 97.8 F (36.6 C) (Oral)   Ht 5' 5.75 (1.67 m)   Wt 201 lb (91.2 kg)   SpO2 97%   BMI 32.69 kg/m   Wt Readings from Last 3 Encounters:  09/04/24 201 lb (91.2 kg)  03/21/24 200 lb 8 oz (90.9 kg)  12/21/23 195 lb 6 oz (88.6 kg)      Physical Exam Vitals and nursing note reviewed.  Constitutional:      Appearance: Normal appearance. She is not ill-appearing.  HENT:     Head: Normocephalic and atraumatic.     Mouth/Throat:     Mouth: Mucous membranes are moist.     Pharynx: Oropharynx is clear. No oropharyngeal exudate or posterior oropharyngeal erythema.   Eyes:     Extraocular Movements: Extraocular movements intact.     Pupils: Pupils are equal, round, and reactive to light.  Neck:     Thyroid : No thyroid  mass or thyromegaly.  Cardiovascular:     Rate and Rhythm: Normal rate and regular rhythm.     Pulses: Normal pulses.     Heart sounds: Normal heart sounds. No murmur heard. Pulmonary:     Effort: Pulmonary effort is normal. No respiratory distress.     Breath sounds: Normal breath sounds. No wheezing, rhonchi or rales.  Musculoskeletal:     Cervical back: Normal range of motion and neck supple.     Right lower leg: No edema.     Left lower leg: No edema.  Skin:    General: Skin is warm and dry.     Capillary Refill: Capillary refill takes less than 2 seconds.     Findings: No rash.  Neurological:     Mental Status: She is alert.  Psychiatric:        Mood and Affect: Mood normal.        Behavior: Behavior normal.       Results for orders placed or performed in visit on 03/21/24  Sedimentation rate   Collection Time: 03/21/24  8:52 AM  Result Value Ref Range   Sed Rate 5 0 - 30 mm/hr  C-reactive protein   Collection Time: 03/21/24  8:52 AM  Result Value Ref Range   CRP <1.0 0.5 - 20.0 mg/dL  ANA   Collection Time: 03/21/24  8:52 AM  Result Value Ref Range   Anti Nuclear Antibody (ANA) NEGATIVE NEGATIVE      09/04/2024    8:14 AM 03/21/2024    8:09 AM 12/21/2023    2:16 PM 11/17/2023    2:07 PM 05/31/2023    9:15 AM  Depression screen PHQ 2/9  Decreased Interest 0 0 0 1 0  Down, Depressed, Hopeless 0 0 2 0 0  PHQ - 2 Score 0 0 2 1 0  Altered sleeping 0 0 0 0 0  Tired, decreased energy 0 0 1 3 3   Change in appetite 0 0 0 0 0  Feeling bad or failure about yourself  0 0 0 0 0  Trouble concentrating 0 0 0 0 0  Moving slowly or fidgety/restless 0 0 0 3 3  Suicidal thoughts 0 0 0 0 0  PHQ-9 Score 0 0  3  7  6    Difficult doing work/chores Not difficult at all Not difficult at all Not difficult at all Somewhat  difficult Somewhat difficult     Data saved with a previous flowsheet row definition       09/04/2024    8:14  AM 03/21/2024    8:10 AM 12/21/2023    2:16 PM 11/17/2023    2:08 PM  GAD 7 : Generalized Anxiety Score  Nervous, Anxious, on Edge 0 0 1 0  Control/stop worrying 0 0 0 0  Worry too much - different things 0 0 0 0  Trouble relaxing 0 0 0 0  Restless 0 0 3 3  Easily annoyed or irritable 0 0 1 0  Afraid - awful might happen 0 0 1 1  Total GAD 7 Score 0 0 6 4  Anxiety Difficulty Not difficult at all Not difficult at all Somewhat difficult Somewhat difficult   Assessment & Plan:   Problem List Items Addressed This Visit     Obesity (BMI 30.0-34.9) - Primary   Stable period on phentermine  30mg  daily, overall tolerating well.  Refilled today. Discussed oral wegovy pill - to message me in 1-2 wks to see if available to prescribe through EMR - currently still not available       Depression with anxiety   Chronic, stable period on wellbutrin  SR 100mg  bid - continue.       Relevant Medications   buPROPion  ER (WELLBUTRIN  SR) 100 MG 12 hr tablet   Primary osteoarthritis of right knee   Contributes to chronic pain She will f/u with [facility] - discussing surgery.       Relevant Medications   methocarbamol  (ROBAXIN ) 500 MG tablet   traMADol  (ULTRAM ) 50 MG tablet   Elevated blood pressure reading in office without diagnosis of hypertension   BP elevated today - improved on recheck. Rec limit salt/sodium Rec increased water intake.         Meds ordered this encounter  Medications   buPROPion  ER (WELLBUTRIN  SR) 100 MG 12 hr tablet    Sig: Take 1 tablet (100 mg total) by mouth 2 (two) times daily.    Dispense:  60 tablet    Refill:  6   methocarbamol  (ROBAXIN ) 500 MG tablet    Sig: Take 1 tablet (500 mg total) by mouth 2 (two) times daily.    Dispense:  60 tablet    Refill:  3   phentermine  30 MG capsule    Sig: Take 1 capsule (30 mg total) by mouth every  morning.    Dispense:  30 capsule    Refill:  0    No more refills until follow up office visit   traMADol  (ULTRAM ) 50 MG tablet    Sig: Take 1 tablet (50 mg total) by mouth 2 (two) times daily as needed for moderate pain (pain score 4-6) (sedation pecautions).    Dispense:  30 tablet    Refill:  0    No orders of the defined types were placed in this encounter.   Patient Instructions  Continue phentermine  for now.  Monitor blood pressures at home, let me know if consistently >140/90. Limit salt/sodium in diet, drink plenty of water.  Follow up with Dr Edie for knee arthritis /surgery plan Message me in 1-2 week to see if oral wegovy pills are available.  Good to see you today  Return as needed or in 3 months for physical (after 12/17/2024)  Follow up plan: Return in about 3 months (around 12/17/2024) for annual exam, prior fasting for blood work.  Anton Blas, MD   "

## 2024-09-08 NOTE — Telephone Encounter (Signed)
 Left pt voicemail and mychart message informing her of decision

## 2024-09-08 NOTE — Telephone Encounter (Signed)
 Ok to do this. Labs ordered.

## 2024-09-14 ENCOUNTER — Encounter: Payer: Self-pay | Admitting: Family Medicine

## 2024-09-14 NOTE — Telephone Encounter (Signed)
 Pt came into office today - this was addressed. Letter out of work for 2 wks after husband's passing  Rec OV to discuss if ongoing FMLA need past 2 wks.

## 2024-09-29 ENCOUNTER — Other Ambulatory Visit: Payer: Self-pay

## 2024-09-29 MED FILL — Gabapentin Cap 100 MG: ORAL | 30 days supply | Qty: 90 | Fill #3 | Status: AC

## 2024-12-22 ENCOUNTER — Encounter: Admitting: Family Medicine
